# Patient Record
Sex: Female | Born: 1990 | Race: Black or African American | Hispanic: No | Marital: Single | Smoking: Current some day smoker
Health system: Southern US, Community
[De-identification: ages and names within clinical notes are randomized; demographics above are authoritative.]

## PROBLEM LIST (undated history)

## (undated) DIAGNOSIS — Z789 Other specified health status: Secondary | ICD-10-CM

## (undated) DIAGNOSIS — O24419 Gestational diabetes mellitus in pregnancy, unspecified control: Secondary | ICD-10-CM

## (undated) DIAGNOSIS — N83209 Unspecified ovarian cyst, unspecified side: Secondary | ICD-10-CM

## (undated) DIAGNOSIS — E669 Obesity, unspecified: Secondary | ICD-10-CM

## (undated) HISTORY — DX: Obesity, unspecified: E66.9

## (undated) HISTORY — PX: EYE SURGERY: SHX253

## (undated) HISTORY — DX: Gestational diabetes mellitus in pregnancy, unspecified control: O24.419

---

## 2009-12-22 ENCOUNTER — Emergency Department (HOSPITAL_COMMUNITY): Admission: EM | Admit: 2009-12-22 | Discharge: 2009-12-22 | Payer: Self-pay | Admitting: Emergency Medicine

## 2009-12-25 ENCOUNTER — Inpatient Hospital Stay (HOSPITAL_COMMUNITY): Admission: AD | Admit: 2009-12-25 | Discharge: 2009-12-25 | Payer: Self-pay | Admitting: Obstetrics & Gynecology

## 2010-01-03 ENCOUNTER — Ambulatory Visit: Payer: Self-pay | Admitting: Obstetrics & Gynecology

## 2010-01-03 LAB — CONVERTED CEMR LAB: hCG, Beta Chain, Quant, S: 24.3 milliintl units/mL

## 2010-02-01 ENCOUNTER — Encounter: Payer: Self-pay | Admitting: Obstetrics & Gynecology

## 2010-02-01 ENCOUNTER — Ambulatory Visit: Payer: Self-pay | Admitting: Obstetrics & Gynecology

## 2010-07-31 ENCOUNTER — Emergency Department (HOSPITAL_COMMUNITY): Admission: EM | Admit: 2010-07-31 | Discharge: 2010-07-31 | Payer: Self-pay | Admitting: Emergency Medicine

## 2011-01-17 LAB — HCG, QUANTITATIVE, PREGNANCY: hCG, Beta Chain, Quant, S: 532 m[IU]/mL — ABNORMAL HIGH (ref <5)

## 2011-01-18 LAB — WET PREP, GENITAL
Trich, Wet Prep: NONE SEEN
Yeast Wet Prep HPF POC: NONE SEEN

## 2011-01-18 LAB — URINALYSIS, ROUTINE W REFLEX MICROSCOPIC
Bilirubin Urine: NEGATIVE
Glucose, UA: NEGATIVE mg/dL
Specific Gravity, Urine: 1.018 (ref 1.005–1.030)
Urobilinogen, UA: 1 mg/dL (ref 0.0–1.0)
pH: 7.5 (ref 5.0–8.0)

## 2011-01-18 LAB — GC/CHLAMYDIA PROBE AMP, GENITAL
Chlamydia, DNA Probe: NEGATIVE
GC Probe Amp, Genital: NEGATIVE

## 2011-01-18 LAB — POCT PREGNANCY, URINE: Preg Test, Ur: POSITIVE

## 2011-01-18 LAB — URINE MICROSCOPIC-ADD ON

## 2011-01-18 LAB — HCG, QUANTITATIVE, PREGNANCY: hCG, Beta Chain, Quant, S: 2622 m[IU]/mL — ABNORMAL HIGH (ref <5)

## 2011-11-02 ENCOUNTER — Encounter: Payer: Self-pay | Admitting: Emergency Medicine

## 2011-11-02 ENCOUNTER — Emergency Department (INDEPENDENT_AMBULATORY_CARE_PROVIDER_SITE_OTHER)
Admission: EM | Admit: 2011-11-02 | Discharge: 2011-11-02 | Disposition: A | Discharge status: Home / Self Care | Payer: Self-pay | Source: Home / Self Care | Attending: Family Medicine | Admitting: Family Medicine

## 2011-11-02 DIAGNOSIS — J029 Acute pharyngitis, unspecified: Secondary | ICD-10-CM

## 2011-11-02 MED ORDER — GUAIFENESIN-CODEINE 100-10 MG/5ML PO SYRP: 5.0000 mL | ORAL_SOLUTION | Freq: Four times a day (QID) | ORAL | Status: AC | PRN

## 2011-11-02 NOTE — ED Notes (Signed)
PT HERE WITH COUGH AND YELLOW PHLEGM AND SORE THROAT THAT WORSENS WITH EATING OR DRINKING.SX STARTED X 3 DAYS AGO.NO FEVERS/N/V REPORTED

## 2011-11-02 NOTE — ED Provider Notes (Signed)
History     CSN: [id number]  Arrival date & time 11/02/11  1518   First MD Initiated Contact with Patient 11/02/11 1638      Chief Complaint  Patient presents with  . Sore Throat  . Cough    (Consider location/radiation/quality/duration/timing/severity/associated sxs/prior treatment) HPI Comments: Brandi Andrews presents for evaluation of persistent sore throat and cough, worse at night. She reports typical URI symptoms that have mostly resolved, except for nighttime cough.   Patient is a 21 y.o. female presenting with cough. The history is provided by the patient.  Cough This is a new problem. The current episode started more than 2 days ago. The problem occurs constantly. The cough is non-productive. There has been no fever. Associated symptoms include rhinorrhea, sore throat and myalgias. Pertinent negatives include no chills, no ear pain, no shortness of breath and no wheezing. She has tried decongestants and cough syrup for the symptoms. The treatment provided no relief.    History reviewed. No pertinent past medical history.  History reviewed. No pertinent past surgical history.  No family history on file.  History  Substance Use Topics  . Smoking status: Never Smoker   . Smokeless tobacco: Not on file  . Alcohol Use: Yes    OB History    Grav Para Term Preterm Abortions TAB SAB Ect Mult Living                  Review of Systems  Constitutional: Negative for fever and chills.  HENT: Positive for congestion, sore throat, rhinorrhea and sneezing. Negative for ear pain and trouble swallowing.   Eyes: Negative.   Respiratory: Positive for cough. Negative for shortness of breath and wheezing.   Cardiovascular: Negative.   Gastrointestinal: Negative.   Genitourinary: Negative.   Musculoskeletal: Positive for myalgias.  Skin: Negative.     Allergies  Review of patient's allergies indicates no known allergies.  Home Medications   Current Outpatient Rx  Name Route  Sig Dispense Refill  . GUAIFENESIN-CODEINE 100-10 MG/5ML PO SYRP Oral Take 5 mLs by mouth every 6 (six) hours as needed for cough or congestion. 120 mL 0    BP 117/76  Pulse 83  Temp(Src) 98.2 F (36.8 C) (Oral)  Resp 18  SpO2 97%  LMP 10/18/2011  Physical Exam  Nursing note and vitals reviewed. Constitutional: She is oriented to person, place, and time. She appears well-developed and well-nourished.  HENT:  Head: Normocephalic and atraumatic.  Right Ear: Tympanic membrane and external ear normal.  Left Ear: Tympanic membrane and external ear normal.  Mouth/Throat: Uvula is midline, oropharynx is clear and moist and mucous membranes are normal. No oropharyngeal exudate, posterior oropharyngeal edema or posterior oropharyngeal erythema.  Eyes: EOM are normal.  Neck: Normal range of motion.  Pulmonary/Chest: Effort normal and breath sounds normal. She has no wheezes. She has no rhonchi.  Musculoskeletal: Normal range of motion.  Neurological: She is alert and oriented to person, place, and time.  Skin: Skin is warm and dry.  Psychiatric: Her behavior is normal.    ED Course  Procedures (including critical care time)   Labs Reviewed  POCT RAPID STREP A ([facility] ONLY)   No results found.   1. Pharyngitis       MDM  Rapid strep negative; viral pharyngitis; guaifenesin AC        Richardo Priest, MD 11/02/11 1814

## 2012-06-22 ENCOUNTER — Encounter (HOSPITAL_COMMUNITY): Payer: Self-pay | Admitting: Emergency Medicine

## 2012-06-22 ENCOUNTER — Emergency Department (HOSPITAL_COMMUNITY)
Admission: EM | Admit: 2012-06-22 | Discharge: 2012-06-22 | Disposition: A | Discharge status: Home / Self Care | Payer: Self-pay | Attending: Emergency Medicine | Admitting: Emergency Medicine

## 2012-06-22 DIAGNOSIS — K029 Dental caries, unspecified: Secondary | ICD-10-CM | POA: Insufficient documentation

## 2012-06-22 DIAGNOSIS — K0889 Other specified disorders of teeth and supporting structures: Secondary | ICD-10-CM

## 2012-06-22 MED ORDER — PENICILLIN V POTASSIUM 500 MG PO TABS: 500.0000 mg | ORAL_TABLET | Freq: Four times a day (QID) | ORAL | Status: AC

## 2012-06-22 MED ORDER — BUPIVACAINE-EPINEPHRINE PF 0.5-1:200000 % IJ SOLN
1.8000 mL | Freq: Once | INTRAMUSCULAR | Status: AC
  Administered 2012-06-22: 9 mg

## 2012-06-22 MED ORDER — OXYCODONE-ACETAMINOPHEN 5-325 MG PO TABS: 1.0000 | ORAL_TABLET | ORAL | Status: AC | PRN

## 2012-06-22 MED ORDER — BUPIVACAINE-EPINEPHRINE PF 0.5-1:200000 % IJ SOLN
INTRAMUSCULAR | Status: AC
  Filled 2012-06-22: qty 1.8

## 2012-06-22 MED ORDER — OXYCODONE-ACETAMINOPHEN 5-325 MG PO TABS
2.0000 | ORAL_TABLET | Freq: Once | ORAL | Status: AC
  Administered 2012-06-22: 2 via ORAL
  Filled 2012-06-22: qty 2

## 2012-06-22 NOTE — ED Notes (Signed)
Pt told to call [facility] on d/c instructions. Pts sister is taking her home. Pt verbalized understanding of d/c instructions and walked to d/c window.

## 2012-06-22 NOTE — ED Notes (Signed)
Pt report bottom left tooth pain that started yesterday at 10/10 pain. Pt thinks the tooth broke. Pt also reports finger tips have been tingling.

## 2012-06-23 NOTE — ED Provider Notes (Signed)
History     CSN: [id number]  Arrival date & time 06/22/12  1214   First MD Initiated Contact with Patient 06/22/12 1313      No chief complaint on file.   (Consider location/radiation/quality/duration/timing/severity/associated sxs/prior treatment) HPI Comments: Brandi Andrews 21 y.o. female   The chief complaint is: dental pain.    Patient c/o pain in left lower teeth.She states that pain began yesterday and has gradually worsened,  Pain is now 10/10.  She was at work today and was unable to complete her shift due to pain. She denies any drainage int mouth. denies difficulty breathing or swallowing.  Denies fevers, chills, myalgias, arthralgias, nausea, vomiting, diarrhea.     The history is provided by the patient. No language interpreter was used.    No past medical history on file.  History reviewed. No pertinent past surgical history.  No family history on file.  History  Substance Use Topics  . Smoking status: Never Smoker   . Smokeless tobacco: Not on file  . Alcohol Use: Yes    OB History    Grav Para Term Preterm Abortions TAB SAB Ect Mult Living                  Review of Systems  Constitutional: Negative for fever and chills.  HENT: Positive for facial swelling and dental problem. Negative for ear pain, congestion, sore throat, mouth sores, trouble swallowing, neck pain, neck stiffness and sinus pressure.   Respiratory: Negative for choking, shortness of breath, wheezing and stridor.   Cardiovascular: Negative for chest pain.  Gastrointestinal: Negative for nausea, vomiting, abdominal pain and diarrhea.  Musculoskeletal: Negative for myalgias and arthralgias.    Allergies  Review of patient's allergies indicates no known allergies.  Home Medications   Current Outpatient Rx  Name Route Sig Dispense Refill  . NAPROXEN SODIUM 220 MG PO TABS Oral Take 220 mg by mouth 2 (two) times daily with a meal.    . OXYCODONE-ACETAMINOPHEN 5-325 MG PO TABS  Oral Take 1-2 tablets by mouth every 4 (four) hours as needed for pain. 6 tablet 0  . PENICILLIN V POTASSIUM 500 MG PO TABS Oral Take 1 tablet (500 mg total) by mouth 4 (four) times daily. 20 tablet 0    BP 123/78  Pulse 77  Temp 98.6 F (37 C)  Resp 18  SpO2 100%  LMP 06/04/2012  Physical Exam  Nursing note and vitals reviewed. Constitutional: She is oriented to person, place, and time. She appears well-developed and well-nourished. No distress.  HENT:  Head: Normocephalic and atraumatic.  Mouth/Throat: Dental caries present. No dental abscesses or uvula swelling.         Multiple dental caries.Pain in tooth 17,18,and 19. Gingival edema and erythema present. No sores , discharge, or  fluctuance noted on exam.  Eyes: Conjunctivae are normal. No scleral icterus.  Neck: Normal range of motion.  Cardiovascular: Normal rate, regular rhythm and normal heart sounds.  Exam reveals no gallop and no friction rub.   No murmur heard. Pulmonary/Chest: Effort normal and breath sounds normal. No respiratory distress.  Abdominal: Soft. Bowel sounds are normal. She exhibits no distension and no mass. There is no tenderness. There is no guarding.  Neurological: She is alert and oriented to person, place, and time.  Skin: Skin is warm and dry. She is not diaphoretic.    ED Course  Dental Performed by: Arthor Captain Authorized by: Arthor Captain Risks and benefits: risks, benefits and alternatives were  discussed Local anesthesia used: yes Anesthesia: nerve block Local anesthetic: bupivacaine 0.5% with epinephrine and co-phenylcaine spray Anesthetic total: 0.9 ml Patient tolerance: Patient tolerated the procedure well with no immediate complications.   (including critical care time)  Labs Reviewed - No data to display No results found.   1. Pain, dental   2. Dental caries       MDM  Patient with immediated relief form dental block with local infiltration at the base of tooth  17,18,and 19.  Plan  1) d/c PATIENT  With dental follow up 2) narcotic pain relief  Percocet 5/325 # 3) Pen vk 1 qid x10 days All questions answered fully. Discussed reasons to seek immediate care. Patient expresses understanding and agrees with plan.       Arthor Captain, PA-C 06/25/12 2306

## 2013-06-18 ENCOUNTER — Emergency Department (INDEPENDENT_AMBULATORY_CARE_PROVIDER_SITE_OTHER)
Admission: EM | Admit: 2013-06-18 | Discharge: 2013-06-18 | Disposition: A | Discharge status: Home / Self Care | Payer: PRIVATE HEALTH INSURANCE | Source: Home / Self Care | Attending: Emergency Medicine | Admitting: Emergency Medicine

## 2013-06-18 ENCOUNTER — Encounter (HOSPITAL_COMMUNITY): Payer: Self-pay | Admitting: Emergency Medicine

## 2013-06-18 DIAGNOSIS — W57XXXA Bitten or stung by nonvenomous insect and other nonvenomous arthropods, initial encounter: Secondary | ICD-10-CM

## 2013-06-18 DIAGNOSIS — IMO0001 Reserved for inherently not codable concepts without codable children: Secondary | ICD-10-CM

## 2013-06-18 MED ORDER — RANITIDINE HCL 150 MG PO CAPS: 150.0000 mg | ORAL_CAPSULE | Freq: Every day | ORAL | Status: DC

## 2013-06-18 MED ORDER — CETIRIZINE HCL 10 MG PO TABS: 10.0000 mg | ORAL_TABLET | Freq: Every day | ORAL | Status: DC

## 2013-06-18 MED ORDER — DOXYCYCLINE HYCLATE 100 MG PO CAPS: 100.0000 mg | ORAL_CAPSULE | Freq: Two times a day (BID) | ORAL | Status: DC

## 2013-06-18 MED ORDER — METHYLPREDNISOLONE 4 MG PO KIT: PACK | ORAL | Status: DC

## 2013-06-18 NOTE — ED Notes (Addendum)
Pt c/o insect bite to right forearm onset 2 days sxs include: swelling, tenderness, redness Denies: fevers... Taking benadryl w/no relief.  Alert w/no signs of acute distress.

## 2013-06-18 NOTE — ED Provider Notes (Signed)
Medical screening examination/treatment/procedure(s) were performed by non-physician practitioner and as supervising physician I was immediately available for consultation/collaboration.  Leslee Home, M.D.  Reuben Likes, MD 06/18/13 2216

## 2013-06-18 NOTE — ED Notes (Signed)
Bed: UC10 Expected date:  Expected time:  Means of arrival:  Comments: 

## 2013-06-18 NOTE — Discharge Instructions (Signed)
Skin Infections  A skin infection usually develops as a result of disruption of the skin barrier.   CAUSES   A skin infection might occur following:   Trauma or an injury to the skin such as a cut or insect sting.   Inflammation (as in eczema).   Breaks in the skin between the toes (as in athlete's foot).   Swelling (edema).  SYMPTOMS   The legs are the most common site affected. Usually there is:   Redness.   Swelling.   Pain.   There may be red streaks in the area of the infection.  TREATMENT    Minor skin infections may be treated with topical antibiotics, but if the skin infection is severe, hospital care and intravenous (IV) antibiotic treatment may be needed.   Most often skin infections can be treated with oral antibiotic medicine as well as proper rest and elevation of the affected area until the infection improves.   If you are prescribed oral antibiotics, it is important to take them as directed and to take all the pills even if you feel better before you have finished all of the medicine.   You may apply warm compresses to the area for 20-30 minutes 4 times daily.  You might need a tetanus shot now if:   You have no idea when you had the last one.   You have never had a tetanus shot before.   Your wound had dirt in it.  If you need a tetanus shot and you decide not to get one, there is a rare chance of getting tetanus. Sickness from tetanus can be serious. If you get a tetanus shot, your arm may swell and become red and warm at the shot site. This is common and not a problem.  SEEK MEDICAL CARE IF:   The pain and swelling from your infection do not improve within 2 days.   SEEK IMMEDIATE MEDICAL CARE IF:   You develop a fever, chills, or other serious problems.   Document Released: 11/21/2004 Document Revised: 01/06/2012 Document Reviewed: 10/03/2008  [employer] Patient Information 2014 [employer]

## 2013-06-18 NOTE — ED Provider Notes (Signed)
CSN: [id number]     Arrival date & time 06/18/13  1815 History     First MD Initiated Contact with Patient 06/18/13 1915     Chief Complaint  Patient presents with  . Insect Bite   (Consider location/radiation/quality/duration/timing/severity/associated sxs/prior Treatment) HPI Comments: 22 year old female presents for evaluation of an insect bite to her right forearm sustained 2 days ago. She initially had itching. Since then, she increased redness and swelling, and the area has gotten great painful. Night, she denies any other symptoms. She denies fever, chills, NVD, rash elsewhere. She is taking Benadryl for this which has not been helpful.   History reviewed. No pertinent past medical history. History reviewed. No pertinent past surgical history. No family history on file. History  Substance Use Topics  . Smoking status: Never Smoker   . Smokeless tobacco: Not on file  . Alcohol Use: Yes   OB History   Grav Para Term Preterm Abortions TAB SAB Ect Mult Living                 Review of Systems  Constitutional: Negative for fever and chills.  Eyes: Negative for visual disturbance.  Respiratory: Negative for cough and shortness of breath.   Cardiovascular: Negative for chest pain, palpitations and leg swelling.  Gastrointestinal: Negative for nausea, vomiting and abdominal pain.  Endocrine: Negative for polydipsia and polyuria.  Genitourinary: Negative for dysuria, urgency and frequency.  Musculoskeletal: Negative for myalgias and arthralgias.  Skin: Positive for rash (see history of present illness).  Neurological: Negative for dizziness, weakness and light-headedness.    Allergies  Review of patient's allergies indicates no known allergies.  Home Medications   Current Outpatient Rx  Name  Route  Sig  Dispense  Refill  . cetirizine (ZYRTEC) 10 MG tablet   Oral   Take 1 tablet (10 mg total) by mouth daily.   30 tablet   0   . doxycycline (VIBRAMYCIN) 100 MG  capsule   Oral   Take 1 capsule (100 mg total) by mouth 2 (two) times daily.   14 capsule   0   . methylPREDNISolone (MEDROL DOSEPAK) 4 MG tablet      Use as directed   21 tablet   0   . naproxen sodium (ANAPROX) 220 MG tablet   Oral   Take 220 mg by mouth 2 (two) times daily with a meal.         . ranitidine (ZANTAC) 150 MG capsule   Oral   Take 1 capsule (150 mg total) by mouth daily.   30 capsule   0    BP 125/69  Pulse 74  Temp(Src) 98.2 F (36.8 C) (Oral)  Resp 16  SpO2 100% Physical Exam  Constitutional: She is oriented to person, place, and time. She appears well-developed and well-nourished. No distress.  HENT:  Head: Normocephalic and atraumatic.  Pulmonary/Chest: Effort normal.  Musculoskeletal:       Arms: Neurological: She is alert and oriented to person, place, and time. Coordination normal.  Skin: Skin is warm and dry. She is not diaphoretic.  Psychiatric: She has a normal mood and affect.    ED Course   Procedures (including critical care time)  Labs Reviewed - No data to display No results found. 1. Insect bite, infected     MDM  Insect bite causing cellulitis. Treat with doxycycline, we'll also treat symptomatically. Followup if not improving   Meds ordered this encounter  Medications  . doxycycline (VIBRAMYCIN) 100  MG capsule    Sig: Take 1 capsule (100 mg total) by mouth 2 (two) times daily.    Dispense:  14 capsule    Refill:  0  . methylPREDNISolone (MEDROL DOSEPAK) 4 MG tablet    Sig: Use as directed    Dispense:  21 tablet    Refill:  0  . cetirizine (ZYRTEC) 10 MG tablet    Sig: Take 1 tablet (10 mg total) by mouth daily.    Dispense:  30 tablet    Refill:  0  . ranitidine (ZANTAC) 150 MG capsule    Sig: Take 1 capsule (150 mg total) by mouth daily.    Dispense:  30 capsule    Refill:  0     Graylon Good, PA-C 06/18/13 1921

## 2013-07-02 ENCOUNTER — Emergency Department (INDEPENDENT_AMBULATORY_CARE_PROVIDER_SITE_OTHER)
Admission: EM | Admit: 2013-07-02 | Discharge: 2013-07-02 | Disposition: A | Discharge status: Home / Self Care | Payer: Self-pay | Source: Home / Self Care

## 2013-07-02 ENCOUNTER — Other Ambulatory Visit (HOSPITAL_COMMUNITY)
Admission: RE | Admit: 2013-07-02 | Discharge: 2013-07-02 | Disposition: A | Discharge status: Home / Self Care | Payer: Self-pay | Source: Ambulatory Visit | Attending: Emergency Medicine | Admitting: Emergency Medicine

## 2013-07-02 DIAGNOSIS — N760 Acute vaginitis: Secondary | ICD-10-CM | POA: Insufficient documentation

## 2013-07-02 DIAGNOSIS — Z113 Encounter for screening for infections with a predominantly sexual mode of transmission: Secondary | ICD-10-CM | POA: Insufficient documentation

## 2013-07-02 LAB — POCT URINALYSIS DIP (DEVICE)
Hgb urine dipstick: NEGATIVE
Nitrite: NEGATIVE
Urobilinogen, UA: 0.2 mg/dL (ref 0.0–1.0)
pH: 7 (ref 5.0–8.0)

## 2013-07-02 MED ORDER — METRONIDAZOLE 0.75 % VA GEL: 1.0000 | VAGINAL | Status: DC

## 2013-07-02 NOTE — ED Provider Notes (Signed)
CSN: [id number]     Arrival date & time 07/02/13  1114 History   None    No chief complaint on file.  (Consider location/radiation/quality/duration/timing/severity/associated sxs/prior Treatment) Patient is a 22 y.o. female presenting with vaginal discharge. The history is provided by the patient.  Vaginal Discharge Quality:  Thin and watery Severity:  Mild Duration:  4 days Progression:  Unchanged Chronicity:  New Associated symptoms: vaginal itching   Associated symptoms: no abdominal pain, no dysuria, no fever and no urinary frequency   Risk factors: unprotected sex   Risk factors: no new sexual partner and no STI exposure     No past medical history on file. No past surgical history on file. No family history on file. History  Substance Use Topics  . Smoking status: Never Smoker   . Smokeless tobacco: Not on file  . Alcohol Use: Yes   OB History   Grav Para Term Preterm Abortions TAB SAB Ect Mult Living                 Review of Systems  Constitutional: Negative.  Negative for fever.  Gastrointestinal: Negative.  Negative for abdominal pain.  Genitourinary: Positive for vaginal discharge. Negative for dysuria, vaginal bleeding and vaginal pain.    Allergies  Review of patient's allergies indicates no known allergies.  Home Medications   Current Outpatient Rx  Name  Route  Sig  Dispense  Refill  . cetirizine (ZYRTEC) 10 MG tablet   Oral   Take 1 tablet (10 mg total) by mouth daily.   30 tablet   0   . doxycycline (VIBRAMYCIN) 100 MG capsule   Oral   Take 1 capsule (100 mg total) by mouth 2 (two) times daily.   14 capsule   0   . methylPREDNISolone (MEDROL DOSEPAK) 4 MG tablet      Use as directed   21 tablet   0   . metroNIDAZOLE (METROGEL VAGINAL) 0.75 % vaginal gel   Vaginal   Place 1 Applicatorful vaginally 1 day or 1 dose. At bedtime for 5 nights   70 g   0   . naproxen sodium (ANAPROX) 220 MG tablet   Oral   Take 220 mg by mouth 2 (two)  times daily with a meal.         . ranitidine (ZANTAC) 150 MG capsule   Oral   Take 1 capsule (150 mg total) by mouth daily.   30 capsule   0    BP 115/76  Pulse 69  Temp(Src) 99 F (37.2 C) (Oral)  Resp 16  SpO2 100% Physical Exam  Nursing note and vitals reviewed. Constitutional: She is oriented to person, place, and time. She appears well-developed and well-nourished.  Abdominal: Soft. Bowel sounds are normal. She exhibits no distension and no mass. There is no tenderness. There is no rebound and no guarding.  Genitourinary: Uterus normal. Cervix exhibits discharge. Cervix exhibits no motion tenderness and no friability. Right adnexum displays no mass, no tenderness and no fullness. Left adnexum displays no mass, no tenderness and no fullness. No erythema or tenderness around the vagina. No foreign body around the vagina. Vaginal discharge found.    Clear vag d/c.  Neurological: She is alert and oriented to person, place, and time.  Skin: Skin is warm and dry.    ED Course  Procedures (including critical care time) Labs Review Labs Reviewed  POCT URINALYSIS DIP (DEVICE) - Abnormal; Notable for the following:  Leukocytes, UA SMALL (*)    All other components within normal limits  POCT PREGNANCY, URINE   Imaging Review No results found.  MDM   1. Vaginitis        Linna Hoff, MD 07/02/13 1233

## 2013-07-05 NOTE — ED Notes (Signed)
GC/Chlamydia neg., Affirm: Candida and Trich neg., Gardnerella pos. Pt. adequately treated with Metrogel. Vassie Moselle 07/05/2013

## 2013-07-30 ENCOUNTER — Encounter (HOSPITAL_COMMUNITY): Payer: Self-pay | Admitting: Emergency Medicine

## 2013-07-30 ENCOUNTER — Emergency Department (HOSPITAL_COMMUNITY)
Admission: EM | Admit: 2013-07-30 | Discharge: 2013-07-30 | Disposition: A | Discharge status: Home / Self Care | Payer: Self-pay | Attending: Emergency Medicine | Admitting: Emergency Medicine

## 2013-07-30 DIAGNOSIS — Y939 Activity, unspecified: Secondary | ICD-10-CM | POA: Insufficient documentation

## 2013-07-30 DIAGNOSIS — Z23 Encounter for immunization: Secondary | ICD-10-CM | POA: Insufficient documentation

## 2013-07-30 DIAGNOSIS — IMO0002 Reserved for concepts with insufficient information to code with codable children: Secondary | ICD-10-CM | POA: Insufficient documentation

## 2013-07-30 DIAGNOSIS — Y92410 Unspecified street and highway as the place of occurrence of the external cause: Secondary | ICD-10-CM | POA: Insufficient documentation

## 2013-07-30 DIAGNOSIS — Z79899 Other long term (current) drug therapy: Secondary | ICD-10-CM | POA: Insufficient documentation

## 2013-07-30 DIAGNOSIS — T07XXXA Unspecified multiple injuries, initial encounter: Secondary | ICD-10-CM

## 2013-07-30 MED ORDER — TETANUS-DIPHTH-ACELL PERTUSSIS 5-2.5-18.5 LF-MCG/0.5 IM SUSP
0.5000 mL | Freq: Once | INTRAMUSCULAR | Status: AC
  Administered 2013-07-30: 0.5 mL via INTRAMUSCULAR
  Filled 2013-07-30: qty 0.5
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
## 2013-07-31 NOTE — ED Provider Notes (Signed)
Medical screening examination/treatment/procedure(s) were performed by non-physician practitioner and as supervising physician I was immediately available for consultation/collaboration.  Esli Jernigan, MD 07/31/13 0216 

## 2013-12-29 ENCOUNTER — Ambulatory Visit: Payer: Self-pay

## 2014-04-21 ENCOUNTER — Encounter (HOSPITAL_COMMUNITY): Payer: Self-pay | Admitting: Emergency Medicine

## 2014-04-21 ENCOUNTER — Emergency Department (INDEPENDENT_AMBULATORY_CARE_PROVIDER_SITE_OTHER)
Admission: EM | Admit: 2014-04-21 | Discharge: 2014-04-21 | Disposition: A | Discharge status: Home / Self Care | Payer: Self-pay | Source: Home / Self Care

## 2014-04-21 DIAGNOSIS — R112 Nausea with vomiting, unspecified: Secondary | ICD-10-CM

## 2014-04-21 DIAGNOSIS — B349 Viral infection, unspecified: Secondary | ICD-10-CM

## 2014-04-21 DIAGNOSIS — J029 Acute pharyngitis, unspecified: Secondary | ICD-10-CM

## 2014-04-21 DIAGNOSIS — B9789 Other viral agents as the cause of diseases classified elsewhere: Secondary | ICD-10-CM

## 2014-04-21 LAB — POCT RAPID STREP A
STREPTOCOCCUS, GROUP A SCREEN (DIRECT): NEGATIVE
Streptococcus, Group A Screen (Direct): NEGATIVE

## 2014-04-21 MED ORDER — ACETAMINOPHEN-CODEINE #3 300-30 MG PO TABS: 1.0000 | ORAL_TABLET | Freq: Four times a day (QID) | ORAL | Status: DC | PRN

## 2014-04-21 MED ORDER — ONDANSETRON HCL 4 MG PO TABS: 4.0000 mg | ORAL_TABLET | Freq: Four times a day (QID) | ORAL | Status: DC

## 2014-04-21 NOTE — Discharge Instructions (Signed)
Viral Infections A viral infection can be caused by different types of viruses.Most viral infections are not serious and resolve on their own. However, some infections may cause severe symptoms and may lead to further complications. SYMPTOMS Viruses can frequently cause:  Minor sore throat.  Aches and pains.  Headaches.  Runny nose.  Different types of rashes.  Watery eyes.  Tiredness.  Cough.  Loss of appetite.  Gastrointestinal infections, resulting in nausea, vomiting, and diarrhea. These symptoms do not respond to antibiotics because the infection is not caused by bacteria. However, you might catch a bacterial infection following the viral infection. This is sometimes called a "superinfection." Symptoms of such a bacterial infection may include:  Worsening sore throat with pus and difficulty swallowing.  Swollen neck glands.  Chills and a high or persistent fever.  Severe headache.  Tenderness over the sinuses.  Persistent overall ill feeling (malaise), muscle aches, and tiredness (fatigue).  Persistent cough.  Yellow, green, or brown mucus production with coughing. HOME CARE INSTRUCTIONS   Only take over-the-counter or prescription medicines for pain, discomfort, diarrhea, or fever as directed by your caregiver.  Drink enough water and fluids to keep your urine clear or pale yellow. Sports drinks can provide valuable electrolytes, sugars, and hydration.  Get plenty of rest and maintain proper nutrition. Soups and broths with crackers or rice are fine. SEEK IMMEDIATE MEDICAL CARE IF:   You have severe headaches, shortness of breath, chest pain, neck pain, or an unusual rash.  You have uncontrolled vomiting, diarrhea, or you are unable to keep down fluids.  You or your child has an oral temperature above 102 F (38.9 C), not controlled by medicine.  Your baby is older than 3 months with a rectal temperature of 102 F (38.9 C) or higher.  Your baby is 84  months old or younger with a rectal temperature of 100.4 F (38 C) or higher. MAKE SURE YOU:   Understand these instructions.  Will watch your condition.  Will get help right away if you are not doing well or get worse. Document Released: 07/24/2005 Document Revised: 01/06/2012 Document Reviewed: 02/18/2011 [facility] Patient Information 2015 [place], [place]. This information is not intended to replace advice given to you by your health care provider. Make sure you discuss any questions you have with your health care provider.  Sore Throat A sore throat is pain, burning, irritation, or scratchiness of the throat. There is often pain or tenderness when swallowing or talking. A sore throat may be accompanied by other symptoms, such as coughing, sneezing, fever, and swollen neck glands. A sore throat is often the first sign of another sickness, such as a cold, flu, strep throat, or mononucleosis (commonly known as mono). Most sore throats go away without medical treatment. CAUSES  The most common causes of a sore throat include:  A viral infection, such as a cold, flu, or mono.  A bacterial infection, such as strep throat, tonsillitis, or whooping cough.  Seasonal allergies.  Dryness in the air.  Irritants, such as smoke or pollution.  Gastroesophageal reflux disease (GERD). HOME CARE INSTRUCTIONS   Only take over-the-counter medicines as directed by your caregiver.  Drink enough fluids to keep your urine clear or pale yellow.  Rest as needed.  Try using throat sprays, lozenges, or sucking on hard candy to ease any pain (if older than 4 years or as directed).  Sip warm liquids, such as broth, herbal tea, or warm water with honey to relieve pain temporarily.  You may also eat or drink cold or frozen liquids such as frozen ice pops.  Gargle with salt water (mix 1 tsp salt with 8 oz of water).  Do not smoke and avoid secondhand smoke.  Put a cool-mist humidifier in your bedroom at  night to moisten the air. You can also turn on a hot shower and sit in the bathroom with the door closed for 5-10 minutes. SEEK IMMEDIATE MEDICAL CARE IF:  You have difficulty breathing.  You are unable to swallow fluids, soft foods, or your saliva.  You have increased swelling in the throat.  Your sore throat does not get better in 7 days.  You have nausea and vomiting.  You have a fever or persistent symptoms for more than 2-3 days. Nausea and Vomiting Nausea is a sick feeling that often comes before throwing up (vomiting). Vomiting is a reflex where stomach contents come out of your mouth. Vomiting can cause severe loss of body fluids (dehydration). Children and elderly adults can become dehydrated quickly, especially if they also have diarrhea. Nausea and vomiting are symptoms of a condition or disease. It is important to find the cause of your symptoms. CAUSES  Direct irritation of the stomach lining. This irritation can result from increased acid production (gastroesophageal reflux disease), infection, food poisoning, taking certain medicines (such as nonsteroidal anti-inflammatory drugs), alcohol use, or tobacco use. Signals from the brain.These signals could be caused by a headache, heat exposure, an inner ear disturbance, increased pressure in the brain from injury, infection, a tumor, or a concussion, pain, emotional stimulus, or metabolic problems. An obstruction in the gastrointestinal tract (bowel obstruction). Illnesses such as diabetes, hepatitis, gallbladder problems, appendicitis, kidney problems, cancer, sepsis, atypical symptoms of a heart attack, or eating disorders. Medical treatments such as chemotherapy and radiation. Receiving medicine that makes you sleep (general anesthetic) during surgery. DIAGNOSIS Your caregiver may ask for tests to be done if the problems do not improve after a few days. Tests may also be done if symptoms are severe or if the reason for the  nausea and vomiting is not clear. Tests may include: Urine tests. Blood tests. Stool tests. Cultures (to look for evidence of infection). X-rays or other imaging studies. Test results can help your caregiver make decisions about treatment or the need for additional tests. TREATMENT You need to stay well hydrated. Drink frequently but in small amounts.You may wish to drink water, sports drinks, clear broth, or eat frozen ice pops or gelatin dessert to help stay hydrated.When you eat, eating slowly may help prevent nausea.There are also some antinausea medicines that may help prevent nausea. HOME CARE INSTRUCTIONS  Take all medicine as directed by your caregiver. If you do not have an appetite, do not force yourself to eat. However, you must continue to drink fluids. If you have an appetite, eat a normal diet unless your caregiver tells you differently. Eat a variety of complex carbohydrates (rice, wheat, potatoes, bread), lean meats, yogurt, fruits, and vegetables. Avoid high-fat foods because they are more difficult to digest. Drink enough water and fluids to keep your urine clear or pale yellow. If you are dehydrated, ask your caregiver for specific rehydration instructions. Signs of dehydration may include: Severe thirst. Dry lips and mouth. Dizziness. Dark urine. Decreasing urine frequency and amount. Confusion. Rapid breathing or pulse. SEEK IMMEDIATE MEDICAL CARE IF:  You have blood or brown flecks (like coffee grounds) in your vomit. You have black or bloody stools. You have a severe headache  or stiff neck. You are confused. You have severe abdominal pain. You have chest pain or trouble breathing. You do not urinate at least once every 8 hours. You develop cold or clammy skin. You continue to vomit for longer than 24 to 48 hours. You have a fever. MAKE SURE YOU:  Understand these instructions. Will watch your condition. Will get help right away if you are not doing well  or get worse. Document Released: 10/14/2005 Document Revised: 01/06/2012 Document Reviewed: 03/13/2011 [facility] Patient Information 2015 [place], [place]. This information is not intended to replace advice given to you by your health care provider. Make sure you discuss any questions you have with your health care provider.  General Headache Without Cause A headache is pain or discomfort felt around the head or neck area. The specific cause of a headache may not be found. There are many causes and types of headaches. A few common ones are: Tension headaches. Migraine headaches. Cluster headaches. Chronic daily headaches. HOME CARE INSTRUCTIONS  Keep all follow-up appointments with your caregiver or any specialist referral. Only take over-the-counter or prescription medicines for pain or discomfort as directed by your caregiver. Lie down in a dark, quiet room when you have a headache. Keep a headache journal to find out what may trigger your migraine headaches. For example, write down: What you eat and drink. How much sleep you get. Any change to your diet or medicines. Try massage or other relaxation techniques. Put ice packs or heat on the head and neck. Use these 3 to 4 times per day for 15 to 20 minutes each time, or as needed. Limit stress. Sit up straight, and do not tense your muscles. Quit smoking if you smoke. Limit alcohol use. Decrease the amount of caffeine you drink, or stop drinking caffeine. Eat and sleep on a regular schedule. Get 7 to 9 hours of sleep, or as recommended by your caregiver. Keep lights dim if bright lights bother you and make your headaches worse. SEEK MEDICAL CARE IF:  You have problems with the medicines you were prescribed. Your medicines are not working. You have a change from the usual headache. You have nausea or vomiting. SEEK IMMEDIATE MEDICAL CARE IF:  Your headache becomes severe. You have a fever. You have a stiff neck. You have loss of  vision. You have muscular weakness or loss of muscle control. You start losing your balance or have trouble walking. You feel faint or pass out. You have severe symptoms that are different from your first symptoms. MAKE SURE YOU:  Understand these instructions. Will watch your condition. Will get help right away if you are not doing well or get worse. Document Released: 10/14/2005 Document Revised: 01/06/2012 Document Reviewed: 10/30/2011 [facility] Patient Information 2015 [place], [place]. This information is not intended to replace advice given to you by your health care provider. Make sure you discuss any questions you have with your health care provider.   You have a fever and your symptoms suddenly get worse. MAKE SURE YOU:   Understand these instructions.  Will watch your condition.  Will get help right away if you are not doing well or get worse. Document Released: 11/21/2004 Document Revised: 09/30/2012 Document Reviewed: 06/21/2012 [facility] Patient Information 2015 [place], [place]. This information is not intended to replace advice given to you by your health care provider. Make sure you discuss any questions you have with your health care provider.

## 2014-04-21 NOTE — ED Notes (Signed)
Patient complains of headache, sore throat, with body aches; states some chills last night and this morning with some cough.  Also, states nausea and diarrhea this morning.

## 2014-04-21 NOTE — ED Provider Notes (Signed)
CSN: [id number]     Arrival date & time 04/21/14  1604 History   First MD Initiated Contact with Patient 04/21/14 1704     Chief Complaint  Patient presents with  . Headache  . Sore Throat  . Generalized Body Aches   (Consider location/radiation/quality/duration/timing/severity/associated sxs/prior Treatment) HPI Comments: 23 year old female with an acute onset of sore throat, myalgias, chills, headache, nausea vomiting and diarrhea that began last p.m. The GI symptoms began early this morning and she experienced several episodes of diarrhea. None since around 8:00 this morning. No vomiting in the past several hours. Denies rash, fever, tick exposure.   History reviewed. No pertinent past medical history. History reviewed. No pertinent past surgical history. No family history on file. History  Substance Use Topics  . Smoking status: Never Smoker   . Smokeless tobacco: Not on file  . Alcohol Use: Yes   OB History   Grav Para Term Preterm Abortions TAB SAB Ect Mult Living                 Review of Systems  Constitutional: Positive for chills, activity change and appetite change. Negative for fever.  HENT: Positive for postnasal drip and sore throat. Negative for congestion and ear pain.   Eyes: Negative for pain and visual disturbance.  Respiratory: Negative for cough, chest tightness and shortness of breath.   Cardiovascular: Negative for chest pain.  Gastrointestinal: Negative for abdominal pain.       As per history of present illness  Genitourinary: Negative.   Musculoskeletal: Positive for back pain and myalgias.  Neurological: Positive for headaches. Negative for seizures and speech difficulty.    Allergies  Review of patient's allergies indicates no known allergies.  Home Medications   Prior to Admission medications   Medication Sig Start Date End Date Taking? Authorizing Provider  acetaminophen-codeine (TYLENOL #3) 300-30 MG per tablet Take 1-2 tablets by mouth  every 6 (six) hours as needed for moderate pain. 04/21/14   Janne Napoleon, NP  ondansetron (ZOFRAN) 4 MG tablet Take 1 tablet (4 mg total) by mouth every 6 (six) hours. As needed for nausea or vomiting 04/21/14   Janne Napoleon, NP   BP 111/81  Pulse 111  Temp(Src) 98.8 F (37.1 C) (Oral)  Resp 16  SpO2 98%  LMP 04/06/2014 Physical Exam  Nursing note and vitals reviewed. Constitutional: She is oriented to person, place, and time. She appears well-developed. No distress.  HENT:  Mouth/Throat: No oropharyngeal exudate.  Bilateral TMs are normal Oropharynx with mild erythema, cobblestoning and clear PMD.  Eyes: Conjunctivae and EOM are normal.  Neck: Normal range of motion. Neck supple.  Cardiovascular: Normal rate, regular rhythm and normal heart sounds.   Pulmonary/Chest: Effort normal and breath sounds normal. No respiratory distress. She has no wheezes. She has no rales.  Abdominal: Soft. She exhibits no distension. There is no tenderness.  Lymphadenopathy:    She has no cervical adenopathy.  Neurological: She is alert and oriented to person, place, and time.  Skin: Skin is warm and dry.  Psychiatric: She has a normal mood and affect.    ED Course  Procedures (including critical care time) Labs Review Labs Reviewed  POCT RAPID STREP A ([facility] CARE ONLY)  POCT RAPID STREP A ([facility] CARE ONLY)    Imaging Review No results found.   MDM   1. Viral syndrome   2. Pharyngitis   3. Decreased nausea and vomiting     Clear liquids T #  3 for pain #20 zofran OTC meds for drainage Rest Return for problems or worse    Janne Napoleon, NP 04/21/14 1746

## 2014-04-22 NOTE — ED Provider Notes (Signed)
Medical screening examination/treatment/procedure(s) were performed by a resident physician or non-physician practitioner and as the supervising physician I was immediately available for consultation/collaboration.  Lynne Leader, MD    Gregor Hams, MD 04/22/14 [phone]

## 2014-04-23 LAB — CULTURE, GROUP A STREP

## 2015-01-31 ENCOUNTER — Emergency Department (HOSPITAL_COMMUNITY)
Admission: EM | Admit: 2015-01-31 | Discharge: 2015-01-31 | Disposition: A | Discharge status: Home / Self Care | Payer: Self-pay | Source: Home / Self Care | Attending: Family Medicine | Admitting: Family Medicine

## 2015-01-31 ENCOUNTER — Encounter (HOSPITAL_COMMUNITY): Payer: Self-pay | Admitting: Emergency Medicine

## 2015-01-31 DIAGNOSIS — K0889 Other specified disorders of teeth and supporting structures: Secondary | ICD-10-CM

## 2015-01-31 DIAGNOSIS — K088 Other specified disorders of teeth and supporting structures: Secondary | ICD-10-CM

## 2015-01-31 MED ORDER — CLINDAMYCIN HCL 300 MG PO CAPS: 300.0000 mg | ORAL_CAPSULE | Freq: Three times a day (TID) | ORAL | Status: DC

## 2015-01-31 MED ORDER — TERCONAZOLE 80 MG VA SUPP: 80.0000 mg | Freq: Every day | VAGINAL | Status: DC

## 2015-01-31 MED ORDER — DICLOFENAC POTASSIUM 50 MG PO TABS: 50.0000 mg | ORAL_TABLET | Freq: Three times a day (TID) | ORAL | Status: DC

## 2015-01-31 NOTE — ED Notes (Signed)
Pt has been suffering from a right lower toothache for 2 days and states she has had a yeast infection for four days.

## 2015-01-31 NOTE — ED Provider Notes (Signed)
CSN: [id number]     Arrival date & time 01/31/15  1236 History   First MD Initiated Contact with Patient 01/31/15 1411     Chief Complaint  Patient presents with  . Dental Pain  . Vaginitis   (Consider location/radiation/quality/duration/timing/severity/associated sxs/prior Treatment) Patient is a 24 y.o. female presenting with tooth pain. The history is provided by the patient.  Dental Pain Location:  Lower Lower teeth location:  30/RL 1st molar and 19/LL 1st molar Quality:  Throbbing Severity:  Moderate Duration:  1 week Progression:  Worsening Chronicity:  New Context: dental caries and poor dentition   Associated symptoms: no facial pain, no facial swelling and no fever   Risk factors: lack of dental care     History reviewed. No pertinent past medical history. History reviewed. No pertinent past surgical history. History reviewed. No pertinent family history. History  Substance Use Topics  . Smoking status: Never Smoker   . Smokeless tobacco: Not on file  . Alcohol Use: Yes   OB History    No data available     Review of Systems  Constitutional: Negative.  Negative for fever.  HENT: Positive for dental problem. Negative for facial swelling.     Allergies  Review of patient's allergies indicates no known allergies.  Home Medications   Prior to Admission medications   Medication Sig Start Date End Date Taking? Authorizing Provider  acetaminophen-codeine (TYLENOL #3) 300-30 MG per tablet Take 1-2 tablets by mouth every 6 (six) hours as needed for moderate pain. 04/21/14   Janne Napoleon, NP  clindamycin (CLEOCIN) 300 MG capsule Take 1 capsule (300 mg total) by mouth 3 (three) times daily. 01/31/15   Billy Fischer, MD  diclofenac (CATAFLAM) 50 MG tablet Take 1 tablet (50 mg total) by mouth 3 (three) times daily. 01/31/15   Billy Fischer, MD  ondansetron (ZOFRAN) 4 MG tablet Take 1 tablet (4 mg total) by mouth every 6 (six) hours. As needed for nausea or vomiting 04/21/14    Janne Napoleon, NP  terconazole (TERAZOL 3) 80 MG vaginal suppository Place 1 suppository (80 mg total) vaginally at bedtime. 01/31/15   Billy Fischer, MD   BP 131/81 mmHg  Pulse 69  Temp(Src) 98.2 F (36.8 C) (Oral)  Resp 16  SpO2 100%  LMP 01/19/2015 (Exact Date) Physical Exam  Constitutional: She appears well-developed and well-nourished. She appears distressed.  HENT:  Mouth/Throat: Abnormal dentition. Dental caries present.    Nursing note and vitals reviewed.   ED Course  Procedures (including critical care time) Labs Review Labs Reviewed - No data to display  Imaging Review No results found.   MDM   1. Pain, dental        Billy Fischer, MD 02/02/15 [phone]

## 2015-01-31 NOTE — Discharge Instructions (Signed)
Take medicine as prescribed, see your dentist as soon as possible °

## 2015-04-23 ENCOUNTER — Encounter (HOSPITAL_COMMUNITY): Payer: Self-pay | Admitting: *Deleted

## 2015-04-23 ENCOUNTER — Emergency Department (HOSPITAL_COMMUNITY)
Admission: EM | Admit: 2015-04-23 | Discharge: 2015-04-23 | Disposition: A | Discharge status: Home / Self Care | Payer: PRIVATE HEALTH INSURANCE | Attending: Emergency Medicine | Admitting: Emergency Medicine

## 2015-04-23 DIAGNOSIS — Z791 Long term (current) use of non-steroidal anti-inflammatories (NSAID): Secondary | ICD-10-CM | POA: Diagnosis not present

## 2015-04-23 DIAGNOSIS — R5383 Other fatigue: Secondary | ICD-10-CM | POA: Diagnosis not present

## 2015-04-23 DIAGNOSIS — B9789 Other viral agents as the cause of diseases classified elsewhere: Secondary | ICD-10-CM

## 2015-04-23 DIAGNOSIS — Z792 Long term (current) use of antibiotics: Secondary | ICD-10-CM | POA: Diagnosis not present

## 2015-04-23 DIAGNOSIS — J069 Acute upper respiratory infection, unspecified: Secondary | ICD-10-CM | POA: Insufficient documentation

## 2015-04-23 DIAGNOSIS — R630 Anorexia: Secondary | ICD-10-CM | POA: Insufficient documentation

## 2015-04-23 DIAGNOSIS — J029 Acute pharyngitis, unspecified: Secondary | ICD-10-CM | POA: Diagnosis present

## 2015-04-23 MED ORDER — BENZONATATE 100 MG PO CAPS: 100.0000 mg | ORAL_CAPSULE | Freq: Three times a day (TID) | ORAL | Status: DC

## 2015-04-23 NOTE — ED Notes (Signed)
NAD at this time. Pt is stable and going home with her mother.

## 2015-04-23 NOTE — ED Notes (Signed)
Pt presents via POV c/o sore throat, body aches and cough x 2 days  Pt a x 4, NAD.  Pt denies fevers/N/V/D.

## 2015-04-23 NOTE — ED Provider Notes (Signed)
CSN: [id number]     Arrival date & time 04/23/15  1002 History   First MD Initiated Contact with Patient 04/23/15 1007     Chief Complaint  Patient presents with  . Sore Throat   Patient is a 24 y.o. female presenting with pharyngitis.  Sore Throat    24 year old female presents with upper respiratory complaints 2 days. Patient notes that 2 days ago she started developing rhinorrhea, nonproductive cough, fatigue. She reports symptoms have continued to persist with slight worsening over the last day. She reports decreased appetite, reports she is able to eat and drink without significant nausea or vomiting. Patient denies fever, headache, chest pain, shortness of breath, abdominal pain, diarrhea, rashes, close sick contacts. Patient notes that she's been using over-the-counter cough medication with little symptom improvement. Patient reports she's otherwise healthy with no significant comorbidities, does not smoke. Patient does report a sore throat, describes this is minor, no difficulty breathing, swallowing, no drooling, muffled voice. She reports using over-the-counter cough medication.   History reviewed. No pertinent past medical history. History reviewed. No pertinent past surgical history. No family history on file. History  Substance Use Topics  . Smoking status: Never Smoker   . Smokeless tobacco: Not on file  . Alcohol Use: Yes   OB History    No data available     Review of Systems  All other systems reviewed and are negative.   Allergies  Review of patient's allergies indicates no known allergies.  Home Medications   Prior to Admission medications   Medication Sig Start Date End Date Taking? Authorizing Provider  acetaminophen-codeine (TYLENOL #3) 300-30 MG per tablet Take 1-2 tablets by mouth every 6 (six) hours as needed for moderate pain. 04/21/14   Janne Napoleon, NP  benzonatate (TESSALON) 100 MG capsule Take 1 capsule (100 mg total) by mouth every 8 (eight) hours.  04/23/15   Okey Regal, PA-C  clindamycin (CLEOCIN) 300 MG capsule Take 1 capsule (300 mg total) by mouth 3 (three) times daily. 01/31/15   Billy Fischer, MD  diclofenac (CATAFLAM) 50 MG tablet Take 1 tablet (50 mg total) by mouth 3 (three) times daily. 01/31/15   Billy Fischer, MD  ondansetron (ZOFRAN) 4 MG tablet Take 1 tablet (4 mg total) by mouth every 6 (six) hours. As needed for nausea or vomiting 04/21/14   Janne Napoleon, NP  terconazole (TERAZOL 3) 80 MG vaginal suppository Place 1 suppository (80 mg total) vaginally at bedtime. 01/31/15   Billy Fischer, MD   BP 108/66 mmHg  Pulse 77  Temp(Src) 98.4 F (36.9 C) (Oral)  Resp 18  SpO2 100%  LMP 04/09/2015   Physical Exam  Constitutional: She is oriented to person, place, and time. She appears well-developed and well-nourished.  HENT:  Head: Normocephalic and atraumatic.  Mouth/Throat: Uvula is midline, oropharynx is clear and moist and mucous membranes are normal. No oropharyngeal exudate, posterior oropharyngeal edema, posterior oropharyngeal erythema or tonsillar abscesses.  Eyes: Pupils are equal, round, and reactive to light.  Neck: Normal range of motion. Neck supple. No JVD present. No tracheal deviation present. No thyromegaly present.  Cardiovascular: Regular rhythm, normal heart sounds and intact distal pulses.  Exam reveals no gallop and no friction rub.   No murmur heard. Pulmonary/Chest: Effort normal and breath sounds normal. No stridor. No respiratory distress. She has no wheezes. She has no rales. She exhibits no tenderness.  Abdominal: Soft. She exhibits no distension and no mass. There is no tenderness.  There is no rebound and no guarding.  Musculoskeletal: Normal range of motion.  Lymphadenopathy:    She has no cervical adenopathy.  Neurological: She is alert and oriented to person, place, and time. Coordination normal.  Skin: Skin is warm and dry.  Psychiatric: She has a normal mood and affect. Her behavior is normal.  Judgment and thought content normal.  Nursing note and vitals reviewed.   ED Course  Procedures (including critical care time) Labs Review Labs Reviewed - No data to display  Imaging Review No results found.   EKG Interpretation None      MDM   Final diagnoses:  Viral URI with cough    Plan: Patient presents with 2 days of upper respiratory complaints including cough, fatigue. She denies any shortness of breath, chest pain, or fever. Patient's vital signs were noncontributory, oxygen 99 %. Patient has no findings on exam that would indicate further diagnostic or laboratory testing at this time. This likely viral respiratory infection without complication. Patient has no significant comorbidities. Patient will be prescribed cough medication, instructions to follow up with primary care provider in 3 days for reevaluation of symptoms. Patient given strict return precautions, she verbalizes understanding and agreement for today's plan.      Okey Regal, PA-C 04/27/15 1349  Blanchie Dessert, MD 04/28/15 [phone]

## 2015-04-23 NOTE — Discharge Instructions (Signed)
Upper Respiratory Infection, Adult An upper respiratory infection (URI) is also sometimes known as the common cold. The upper respiratory tract includes the nose, sinuses, throat, trachea, and bronchi. Bronchi are the airways leading to the lungs. Most people improve within 1 week, but symptoms can last up to 2 weeks. A residual cough may last even longer.  CAUSES Many different viruses can infect the tissues lining the upper respiratory tract. The tissues become irritated and inflamed and often become very moist. Mucus production is also common. A cold is contagious. You can easily spread the virus to others by oral contact. This includes kissing, sharing a glass, coughing, or sneezing. Touching your mouth or nose and then touching a surface, which is then touched by another person, can also spread the virus. SYMPTOMS  Symptoms typically develop 1 to 3 days after you come in contact with a cold virus. Symptoms vary from person to person. They may include:  Runny nose.  Sneezing.  Nasal congestion.  Sinus irritation.  Sore throat.  Loss of voice (laryngitis).  Cough.  Fatigue.  Muscle aches.  Loss of appetite.  Headache.  Low-grade fever. DIAGNOSIS  You might diagnose your own cold based on familiar symptoms, since most people get a cold 2 to 3 times a year. Your caregiver can confirm this based on your exam. Most importantly, your caregiver can check that your symptoms are not due to another disease such as strep throat, sinusitis, pneumonia, asthma, or epiglottitis. Blood tests, throat tests, and X-rays are not necessary to diagnose a common cold, but they may sometimes be helpful in excluding other more serious diseases. Your caregiver will decide if any further tests are required. RISKS AND COMPLICATIONS  You may be at risk for a more severe case of the common cold if you smoke cigarettes, have chronic heart disease (such as heart failure) or lung disease (such as asthma), or if  you have a weakened immune system. The very young and very old are also at risk for more serious infections. Bacterial sinusitis, middle ear infections, and bacterial pneumonia can complicate the common cold. The common cold can worsen asthma and chronic obstructive pulmonary disease (COPD). Sometimes, these complications can require emergency medical care and may be life-threatening. PREVENTION  The best way to protect against getting a cold is to practice good hygiene. Avoid oral or hand contact with people with cold symptoms. Wash your hands often if contact occurs. There is no clear evidence that vitamin C, vitamin E, echinacea, or exercise reduces the chance of developing a cold. However, it is always recommended to get plenty of rest and practice good nutrition. TREATMENT  Treatment is directed at relieving symptoms. There is no cure. Antibiotics are not effective, because the infection is caused by a virus, not by bacteria. Treatment may include:  Increased fluid intake. Sports drinks offer valuable electrolytes, sugars, and fluids.  Breathing heated mist or steam (vaporizer or shower).  Eating chicken soup or other clear broths, and maintaining good nutrition.  Getting plenty of rest.  Using gargles or lozenges for comfort.  Controlling fevers with ibuprofen or acetaminophen as directed by your caregiver.  Increasing usage of your inhaler if you have asthma. Zinc gel and zinc lozenges, taken in the first 24 hours of the common cold, can shorten the duration and lessen the severity of symptoms. Pain medicines may help with fever, muscle aches, and throat pain. A variety of non-prescription medicines are available to treat congestion and runny nose. Your caregiver  can make recommendations and may suggest nasal or lung inhalers for other symptoms.  HOME CARE INSTRUCTIONS   Only take over-the-counter or prescription medicines for pain, discomfort, or fever as directed by your  caregiver.  Use a warm mist humidifier or inhale steam from a shower to increase air moisture. This may keep secretions moist and make it easier to breathe.  Drink enough water and fluids to keep your urine clear or pale yellow.  Rest as needed.  Return to work when your temperature has returned to normal or as your caregiver advises. You may need to stay home longer to avoid infecting others. You can also use a face mask and careful hand washing to prevent spread of the virus. SEEK MEDICAL CARE IF:   After the first few days, you feel you are getting worse rather than better.  You need your caregiver's advice about medicines to control symptoms.  You develop chills, worsening shortness of breath, or brown or red sputum. These may be signs of pneumonia.  You develop yellow or brown nasal discharge or pain in the face, especially when you bend forward. These may be signs of sinusitis.  You develop a fever, swollen neck glands, pain with swallowing, or white areas in the back of your throat. These may be signs of strep throat. SEEK IMMEDIATE MEDICAL CARE IF:   You have a fever.  You develop severe or persistent headache, ear pain, sinus pain, or chest pain.  You develop wheezing, a prolonged cough, cough up blood, or have a change in your usual mucus (if you have chronic lung disease).  You develop sore muscles or a stiff neck. Document Released: 04/09/2001 Document Revised: 01/06/2012 Document Reviewed: 01/19/2014 [facility] Patient Information 2015 [place], [place]. This information is not intended to replace advice given to you by your health care provider. Make sure you discuss any questions you have with your health care provider.  Please read attached information, please monitor for worsening signs or symptoms to return if any present. Please use cough medication as needed for symptom relief, rest, drink plenty fluids.

## 2015-06-27 ENCOUNTER — Emergency Department (INDEPENDENT_AMBULATORY_CARE_PROVIDER_SITE_OTHER)
Admission: EM | Admit: 2015-06-27 | Discharge: 2015-06-27 | Disposition: A | Discharge status: Home / Self Care | Payer: Self-pay | Source: Home / Self Care | Attending: Emergency Medicine | Admitting: Emergency Medicine

## 2015-06-27 ENCOUNTER — Encounter (HOSPITAL_COMMUNITY): Payer: Self-pay | Admitting: *Deleted

## 2015-06-27 DIAGNOSIS — L03113 Cellulitis of right upper limb: Secondary | ICD-10-CM

## 2015-06-27 MED ORDER — SULFAMETHOXAZOLE-TRIMETHOPRIM 800-160 MG PO TABS: 2.0000 | ORAL_TABLET | Freq: Two times a day (BID) | ORAL | Status: DC

## 2015-06-27 MED ORDER — TRAMADOL HCL 50 MG PO TABS: 50.0000 mg | ORAL_TABLET | Freq: Four times a day (QID) | ORAL | Status: DC | PRN

## 2015-06-27 MED ORDER — IBUPROFEN 800 MG PO TABS: 800.0000 mg | ORAL_TABLET | Freq: Three times a day (TID) | ORAL | Status: DC

## 2015-06-27 MED ORDER — BACITRACIN ZINC 500 UNIT/GM EX OINT
1.0000 "application " | TOPICAL_OINTMENT | Freq: Once | CUTANEOUS | Status: AC
  Administered 2015-06-27: 1 via TOPICAL

## 2015-06-27 NOTE — ED Provider Notes (Signed)
HPI  SUBJECTIVE:  Brandi Andrews is a 24 y.o. female who presents with an erythematous area of gradually increasing size, tenderness, increased temperature on the dorsum of her right wrist starting approximately one week ago. Patient states she thinks that she was bitten by an insect. She notes a pimple in the area starting 2-3 days ago that started draining purulent material spontaneously earlier today. She states that she applied ice, has been taking 400 mg ibuprofen, Benadryl at night. Symptoms are worse with palpation, flexion/extension of her wrists, better after it started draining spontaneously earlier today. No nausea, vomiting, numbness, tingling, fevers, contacts with similar skin infections. Past medical history negative hypertension, diabetes, MRSA, artificial joints, artificial valves, HIV, cancer. LMP now, denies any possibility of being pregnant.   History reviewed. No pertinent past medical history.  History reviewed. No pertinent past surgical history.  History reviewed. No pertinent family history.  Social History  Substance Use Topics  . Smoking status: Never Smoker   . Smokeless tobacco: None  . Alcohol Use: Yes    No current facility-administered medications for this encounter.  Current outpatient prescriptions:  .  acetaminophen-codeine (TYLENOL #3) 300-30 MG per tablet, Take 1-2 tablets by mouth every 6 (six) hours as needed for moderate pain., Disp: 20 tablet, Rfl: 0 .  benzonatate (TESSALON) 100 MG capsule, Take 1 capsule (100 mg total) by mouth every 8 (eight) hours., Disp: 21 capsule, Rfl: 0 .  diclofenac (CATAFLAM) 50 MG tablet, Take 1 tablet (50 mg total) by mouth 3 (three) times daily., Disp: 30 tablet, Rfl: 0 .  ibuprofen (ADVIL,MOTRIN) 800 MG tablet, Take 1 tablet (800 mg total) by mouth 3 (three) times daily., Disp: 30 tablet, Rfl: 0 .  ondansetron (ZOFRAN) 4 MG tablet, Take 1 tablet (4 mg total) by mouth every 6 (six) hours. As needed for nausea or  vomiting, Disp: 12 tablet, Rfl: 0 .  sulfamethoxazole-trimethoprim (BACTRIM DS,SEPTRA DS) 800-160 MG per tablet, Take 2 tablets by mouth 2 (two) times daily., Disp: 40 tablet, Rfl: 0 .  terconazole (TERAZOL 3) 80 MG vaginal suppository, Place 1 suppository (80 mg total) vaginally at bedtime., Disp: 3 suppository, Rfl: 0 .  traMADol (ULTRAM) 50 MG tablet, Take 1 tablet (50 mg total) by mouth every 6 (six) hours as needed., Disp: 15 tablet, Rfl: 0  No Known Allergies   ROS  As noted in HPI.   Physical Exam  BP 108/72 mmHg  Pulse 68  Temp(Src) 98.3 F (36.8 C) (Oral)  Resp 16  SpO2 98%  LMP 06/26/2015  Constitutional: Well developed, well nourished, no acute distress Eyes:  EOMI, conjunctiva normal bilaterally HENT: Normocephalic, atraumatic,mucus membranes moist Respiratory: Normal inspiratory effort Cardiovascular: Normal rate GI: nondistended skin: 2.5 x 3 cm tender area of induration on the dorsum of right wrist with central open area, no expressible purulent drainage. No erythema, increased temperature. Hand exam normal, patient able to do FROM wrist. Marked area of induration with a permanent marker for reference Musculoskeletal: no deformities Neurologic: Alert & oriented x 3, no focal neuro deficits Psychiatric: Speech and behavior appropriate   ED Course   Medications  bacitracin ointment 1 application (1 application Topical Given 06/27/15 1540)    Orders Placed This Encounter  Procedures  . Apply dry sterile dressing    Standing Status: Standing     Number of Occurrences: 1     Standing Expiration Date:     No results found for this or any previous visit (from the past 24  hour(s)). No results found.  ED Clinical Impression  Cellulitis of right upper extremity  ED Assessment/Plan No evidence of septic joint. As wound is draining spontaneously, do not see need for I&D at this time. Wound care done, dressing applied, home with Bactrim, ibuprofen 800 mg,   Tramadol, localized wound care, return here in 2 or 3 days if not getting significantly better. Also referring to Ayesha Mohair here in clinic for orange card and PMD referral. Patient has no primary care physician.  Discussed MDM, plan and followup with patient . Discussed sn/sx that should prompt return to the UC or ED. Patient agrees with plan.  *This clinic note was created using Dragon dictation software. Therefore, there may be occasional mistakes despite careful proofreading.  ?   Melynda Ripple, MD 06/27/15 [id number]

## 2015-06-27 NOTE — Discharge Instructions (Signed)
Warm compresses to this up to 4 times a day. Return here in 2 or 3 days if not getting significantly better for the signs and symptoms we discussed. It will continue to drain. I'm giving you information on MRSA for you to read, although I'm not absolutely positive that this is what is causing your symptoms today. It is a very common cause of infections like these. Follow-up with Burman Nieves here to help you find a primary care provider

## 2015-06-27 NOTE — ED Notes (Signed)
Pt  Has   A  Red  Draining  Irritated  Area  To  r   Wrist      That   She   Noticed  sev  Days  Ago         Area  Is  Tender  To  The  Touch

## 2015-11-20 ENCOUNTER — Emergency Department (INDEPENDENT_AMBULATORY_CARE_PROVIDER_SITE_OTHER)
Admission: EM | Admit: 2015-11-20 | Discharge: 2015-11-20 | Disposition: A | Discharge status: Home / Self Care | Payer: 59 | Source: Home / Self Care | Attending: Family Medicine | Admitting: Family Medicine

## 2015-11-20 ENCOUNTER — Encounter (HOSPITAL_COMMUNITY): Payer: Self-pay | Admitting: Emergency Medicine

## 2015-11-20 DIAGNOSIS — J069 Acute upper respiratory infection, unspecified: Secondary | ICD-10-CM | POA: Diagnosis not present

## 2015-11-20 DIAGNOSIS — J9801 Acute bronchospasm: Secondary | ICD-10-CM

## 2015-11-20 DIAGNOSIS — R0982 Postnasal drip: Secondary | ICD-10-CM

## 2015-11-20 MED ORDER — ALBUTEROL SULFATE HFA 108 (90 BASE) MCG/ACT IN AERS: 2.0000 | INHALATION_SPRAY | RESPIRATORY_TRACT | Status: DC | PRN

## 2015-11-20 MED ORDER — PREDNISONE 20 MG PO TABS: ORAL_TABLET | ORAL | Status: DC

## 2015-11-20 NOTE — Discharge Instructions (Signed)
Bronchospasm, Adult Albuterol HFA for cough and wheeze Prednisone for inflammation Recommend taking Zyrtec or Allegra for drainage. Robitussin-DM every 4 hours as needed for cough Sudafed PE 10 mg every 4 hours if needed for upper respiratory congestion. Saline nasal spray often to help clear nose. Tylenol or ibuprofen for discomfort. A bronchospasm is a spasm or tightening of the airways going into the lungs. During a bronchospasm breathing becomes more difficult because the airways get smaller. When this happens there can be coughing, a whistling sound when breathing (wheezing), and difficulty breathing. Bronchospasm is often associated with asthma, but not all patients who experience a bronchospasm have asthma. CAUSES  A bronchospasm is caused by inflammation or irritation of the airways. The inflammation or irritation may be triggered by:   Allergies (such as to animals, pollen, food, or mold). Allergens that cause bronchospasm may cause wheezing immediately after exposure or many hours later.   Infection. Viral infections are believed to be the most common cause of bronchospasm.   Exercise.   Irritants (such as pollution, cigarette smoke, strong odors, aerosol sprays, and paint fumes).   Weather changes. Winds increase molds and pollens in the air. Rain refreshes the air by washing irritants out. Cold air may cause inflammation.   Stress and emotional upset.  SIGNS AND SYMPTOMS   Wheezing.   Excessive nighttime coughing.   Frequent or severe coughing with a simple cold.   Chest tightness.   Shortness of breath.  DIAGNOSIS  Bronchospasm is usually diagnosed through a history and physical exam. Tests, such as chest X-rays, are sometimes done to look for other conditions. TREATMENT   Inhaled medicines can be given to open up your airways and help you breathe. The medicines can be given using either an inhaler or a nebulizer machine.  Corticosteroid medicines may  be given for severe bronchospasm, usually when it is associated with asthma. HOME CARE INSTRUCTIONS   Always have a plan prepared for seeking medical care. Know when to call your health care provider and local emergency services (911 in the [place] Know where you can access local emergency care.  Only take medicines as directed by your health care provider.  If you were prescribed an inhaler or nebulizer machine, ask your health care provider to explain how to use it correctly. Always use a spacer with your inhaler if you were given one.  It is necessary to remain calm during an attack. Try to relax and breathe more slowly.  Control your home environment in the following ways:   Change your heating and air conditioning filter at least once a month.   Limit your use of fireplaces and wood stoves.  Do not smoke and do not allow smoking in your home.   Avoid exposure to perfumes and fragrances.   Get rid of pests (such as roaches and mice) and their droppings.   Throw away plants if you see mold on them.   Keep your house clean and dust free.   Replace carpet with wood, tile, or vinyl flooring. Carpet can trap dander and dust.   Use allergy-proof pillows, mattress covers, and box spring covers.   Wash bed sheets and blankets every week in hot water and dry them in a dryer.   Use blankets that are made of polyester or cotton.   Wash hands frequently. SEEK MEDICAL CARE IF:   You have muscle aches.   You have chest pain.   The sputum changes from clear or white to yellow, green,  gray, or bloody.   The sputum you cough up gets thicker.   There are problems that may be related to the medicine you are given, such as a rash, itching, swelling, or trouble breathing.  SEEK IMMEDIATE MEDICAL CARE IF:   You have worsening wheezing and coughing even after taking your prescribed medicines.   You have increased difficulty breathing.   You develop severe chest  pain. MAKE SURE YOU:   Understand these instructions.  Will watch your condition.  Will get help right away if you are not doing well or get worse.   This information is not intended to replace advice given to you by your health care provider. Make sure you discuss any questions you have with your health care provider.   Document Released: 10/17/2003 Document Revised: 11/04/2014 Document Reviewed: 04/05/2013 Elsevier Interactive Patient Education 2016 [employer].  How to Use an Inhaler Using your inhaler correctly is very important. Good technique will make sure that the medicine reaches your lungs.  HOW TO USE AN INHALER:  Take the cap off the inhaler.  If this is the first time using your inhaler, you need to prime it. Shake the inhaler for 5 seconds. Release four puffs into the air, away from your face. Ask your doctor for help if you have questions.  Shake the inhaler for 5 seconds.  Turn the inhaler so the bottle is above the mouthpiece.  Put your pointer finger on top of the bottle. Your thumb holds the bottom of the inhaler.  Open your mouth.  Either hold the inhaler away from your mouth (the width of 2 fingers) or place your lips tightly around the mouthpiece. Ask your doctor which way to use your inhaler.  Breathe out as much air as possible.  Breathe in and push down on the bottle 1 time to release the medicine. You will feel the medicine go in your mouth and throat.  Continue to take a deep breath in very slowly. Try to fill your lungs.  After you have breathed in completely, hold your breath for 10 seconds. This will help the medicine to settle in your lungs. If you cannot hold your breath for 10 seconds, hold it for as long as you can before you breathe out.  Breathe out slowly, through pursed lips. Whistling is an example of pursed lips.  If your doctor has told you to take more than 1 puff, wait at least 15-30 seconds between puffs. This will help you get  the best results from your medicine. Do not use the inhaler more than your doctor tells you to.  Put the cap back on the inhaler.  Follow the directions from your doctor or from the inhaler package about cleaning the inhaler. If you use more than one inhaler, ask your doctor which inhalers to use and what order to use them in. Ask your doctor to help you figure out when you will need to refill your inhaler.  If you use a steroid inhaler, always rinse your mouth with water after your last puff, gargle and spit out the water. Do not swallow the water. GET HELP IF:  The inhaler medicine only partially helps to stop wheezing or shortness of breath.  You are having trouble using your inhaler.  You have some increase in thick spit (phlegm). GET HELP RIGHT AWAY IF:  The inhaler medicine does not help your wheezing or shortness of breath or you have tightness in your chest.  You have dizziness, headaches,  or fast heart rate.  You have chills, fever, or night sweats.  You have a large increase of thick spit, or your thick spit is bloody. MAKE SURE YOU:   Understand these instructions.  Will watch your condition.  Will get help right away if you are not doing well or get worse.   This information is not intended to replace advice given to you by your health care provider. Make sure you discuss any questions you have with your health care provider.   Document Released: 07/23/2008 Document Revised: 08/04/2013 Document Reviewed: 05/13/2013 Elsevier Interactive Patient Education 2016 [employer]  Upper Respiratory Infection, Adult Most upper respiratory infections (URIs) are caused by a virus. A URI affects the nose, throat, and upper air passages. The most common type of URI is often called "the common cold." HOME CARE   Take medicines only as told by your doctor.  Gargle warm saltwater or take cough drops to comfort your throat as told by your doctor.  Use a warm mist humidifier or  inhale steam from a shower to increase air moisture. This may make it easier to breathe.  Drink enough fluid to keep your pee (urine) clear or pale yellow.  Eat soups and other clear broths.  Have a healthy diet.  Rest as needed.  Go back to work when your fever is gone or your doctor says it is okay.  You may need to stay home longer to avoid giving your URI to others.  You can also wear a face mask and wash your hands often to prevent spread of the virus.  Use your inhaler more if you have asthma.  Do not use any tobacco products, including cigarettes, chewing tobacco, or electronic cigarettes. If you need help quitting, ask your doctor. GET HELP IF:  You are getting worse, not better.  Your symptoms are not helped by medicine.  You have chills.  You are getting more short of breath.  You have brown or red mucus.  You have yellow or brown discharge from your nose.  You have pain in your face, especially when you bend forward.  You have a fever.  You have puffy (swollen) neck glands.  You have pain while swallowing.  You have white areas in the back of your throat. GET HELP RIGHT AWAY IF:   You have very bad or constant:  Headache.  Ear pain.  Pain in your forehead, behind your eyes, and over your cheekbones (sinus pain).  Chest pain.  You have long-lasting (chronic) lung disease and any of the following:  Wheezing.  Long-lasting cough.  Coughing up blood.  A change in your usual mucus.  You have a stiff neck.  You have changes in your:  Vision.  Hearing.  Thinking.  Mood. MAKE SURE YOU:   Understand these instructions.  Will watch your condition.  Will get help right away if you are not doing well or get worse.   This information is not intended to replace advice given to you by your health care provider. Make sure you discuss any questions you have with your health care provider.   Document Released: 04/01/2008 Document Revised:  02/28/2015 Document Reviewed: 01/19/2014 Elsevier Interactive Patient Education [employer].

## 2015-11-20 NOTE — ED Provider Notes (Signed)
CSN: [id number]     Arrival date & time 11/20/15  1356 History   First MD Initiated Contact with Patient 11/20/15 1702     Chief Complaint  Patient presents with  . URI   (Consider location/radiation/quality/duration/timing/severity/associated sxs/prior Treatment) HPI Comments: 25 year old female complaining of a cough for one week. This is associated with a headache for 2 days, sore throat, PND and sniffles. She states she smokes one or 2 cigarettes a day. Denies documented fevers.  Patient is a 25 y.o. female presenting with URI.  URI Presenting symptoms: congestion, cough, rhinorrhea and sore throat   Presenting symptoms: no fatigue and no fever   Associated symptoms: no neck pain     History reviewed. No pertinent past medical history. History reviewed. No pertinent past surgical history. No family history on file. Social History  Substance Use Topics  . Smoking status: Never Smoker   . Smokeless tobacco: None  . Alcohol Use: Yes   OB History    No data available     Review of Systems  Constitutional: Negative for fever, chills, activity change, appetite change and fatigue.  HENT: Positive for congestion, postnasal drip, rhinorrhea and sore throat. Negative for ear discharge and facial swelling.   Eyes: Negative.   Respiratory: Positive for cough. Negative for shortness of breath.   Cardiovascular: Negative.   Musculoskeletal: Negative for neck pain and neck stiffness.  Skin: Negative for pallor and rash.  Neurological: Negative.     Allergies  Review of patient's allergies indicates no known allergies.  Home Medications   Prior to Admission medications   Medication Sig Start Date End Date Taking? Authorizing Provider  acetaminophen-codeine (TYLENOL #3) 300-30 MG per tablet Take 1-2 tablets by mouth every 6 (six) hours as needed for moderate pain. 04/21/14   Janne Napoleon, NP  albuterol (PROVENTIL HFA;VENTOLIN HFA) 108 (90 Base) MCG/ACT inhaler Inhale 2 puffs into the  lungs every 4 (four) hours as needed for wheezing or shortness of breath. 11/20/15   Janne Napoleon, NP  benzonatate (TESSALON) 100 MG capsule Take 1 capsule (100 mg total) by mouth every 8 (eight) hours. 04/23/15   Okey Regal, PA-C  diclofenac (CATAFLAM) 50 MG tablet Take 1 tablet (50 mg total) by mouth 3 (three) times daily. 01/31/15   Billy Fischer, MD  ibuprofen (ADVIL,MOTRIN) 800 MG tablet Take 1 tablet (800 mg total) by mouth 3 (three) times daily. 06/27/15   Melynda Ripple, MD  ondansetron (ZOFRAN) 4 MG tablet Take 1 tablet (4 mg total) by mouth every 6 (six) hours. As needed for nausea or vomiting 04/21/14   Janne Napoleon, NP  predniSONE (DELTASONE) 20 MG tablet Take 3 tabs po on first day, 2 tabs second day, 2 tabs third day, 1 tab fourth day, 1 tab 5th day. Take with food. 11/20/15   Janne Napoleon, NP  terconazole (TERAZOL 3) 80 MG vaginal suppository Place 1 suppository (80 mg total) vaginally at bedtime. 01/31/15   Billy Fischer, MD  traMADol (ULTRAM) 50 MG tablet Take 1 tablet (50 mg total) by mouth every 6 (six) hours as needed. 06/27/15   Melynda Ripple, MD   Meds Ordered and Administered this Visit  Medications - No data to display  BP 122/88 mmHg  Pulse 82  Temp(Src) 98.3 F (36.8 C) (Oral)  Resp 82  SpO2 100%  LMP 11/07/2015 No data found.   Physical Exam  Constitutional: She is oriented to person, place, and time. She appears well-developed and well-nourished. No distress.  HENT:  Mouth/Throat: No oropharyngeal exudate.  Bilateral TMs mildly retracted. Oropharynx with mild clear PND and cobblestoning. No exudates.   Eyes: Conjunctivae and EOM are normal.  Neck: Normal range of motion. Neck supple.  Cardiovascular: Normal rate, regular rhythm, normal heart sounds and intact distal pulses.   Pulmonary/Chest: Effort normal. No respiratory distress.  Patient having a difficult time taking a deep breath without having call spasms. She was able to take some shallow breaths with no  wheezing. No crackles.  Musculoskeletal: Normal range of motion. She exhibits no edema.  Lymphadenopathy:    She has no cervical adenopathy.  Neurological: She is alert and oriented to person, place, and time. She exhibits normal muscle tone.  Skin: Skin is warm and dry.  Psychiatric: She has a normal mood and affect.  Nursing note and vitals reviewed.   ED Course  Procedures (including critical care time)  Labs Review Labs Reviewed - No data to display  Imaging Review No results found.   Visual Acuity Review  Right Eye Distance:   Left Eye Distance:   Bilateral Distance:    Right Eye Near:   Left Eye Near:    Bilateral Near:         MDM   1. URI (upper respiratory infection)   2. PND (post-nasal drip)   3. Bronchospasm     Bronchospasm, Adult Albuterol HFA for cough and wheeze Prednisone for inflammation Recommend taking Zyrtec or Allegra for drainage. Robitussin-DM every 4 hours as needed for cough Sudafed PE 10 mg every 4 hours if needed for upper respiratory congestion. Saline nasal spray often to help clear nose. Tylenol or ibuprofen for discomfort   Janne Napoleon, NP 11/20/15 1745

## 2015-11-20 NOTE — ED Notes (Signed)
Sore throat, congestion, "little phlegm yellow", headache, painful face, denies fever.

## 2016-01-31 ENCOUNTER — Encounter (HOSPITAL_COMMUNITY): Payer: Self-pay | Admitting: *Deleted

## 2016-01-31 ENCOUNTER — Ambulatory Visit (HOSPITAL_COMMUNITY)
Admission: EM | Admit: 2016-01-31 | Discharge: 2016-01-31 | Disposition: A | Discharge status: Nursing Home (Includes ICF) | Payer: 59 | Attending: Family Medicine | Admitting: Family Medicine

## 2016-01-31 DIAGNOSIS — R51 Headache: Secondary | ICD-10-CM

## 2016-01-31 DIAGNOSIS — R519 Headache, unspecified: Secondary | ICD-10-CM

## 2016-01-31 NOTE — ED Notes (Addendum)
Nausea  And headache x  3  Days  And  Tired      Pt reports was  Lifting  Object  3  Days  Ago  And  Felt a  Pop  In her  Head

## 2016-01-31 NOTE — ED Provider Notes (Signed)
CSN: [id number]     Arrival date & time 01/31/16  1910 History   First MD Initiated Contact with Patient 01/31/16 2016     Chief Complaint  Patient presents with  . Headache   (Consider location/radiation/quality/duration/timing/severity/associated sxs/prior Treatment) Patient is a 25 y.o. female presenting with headaches. The history is provided by the patient and a parent.  Headache Pain location:  Generalized Quality:  Sharp Radiates to:  Does not radiate Onset quality:  Sudden Duration:  3 days Timing:  Constant Progression:  Unchanged Chronicity:  New Similar to prior headaches: no   Context: bright light   Relieved by:  None tried Ineffective treatments:  None tried Associated symptoms: eye pain, nausea and photophobia   Associated symptoms: no abdominal pain, no blurred vision, no fever, no loss of balance, no numbness, no vomiting and no weakness     History reviewed. No pertinent past medical history. History reviewed. No pertinent past surgical history. History reviewed. No pertinent family history. Social History  Substance Use Topics  . Smoking status: Never Smoker   . Smokeless tobacco: None  . Alcohol Use: Yes   OB History    No data available     Review of Systems  Constitutional: Negative.  Negative for fever.  Eyes: Positive for photophobia and pain. Negative for blurred vision.  Respiratory: Negative.   Cardiovascular: Negative.   Gastrointestinal: Positive for nausea. Negative for vomiting and abdominal pain.  Neurological: Positive for headaches. Negative for speech difficulty, weakness, numbness and loss of balance.  All other systems reviewed and are negative.   Allergies  Review of patient's allergies indicates no known allergies.  Home Medications   Prior to Admission medications   Medication Sig Start Date End Date Taking? Authorizing Provider  acetaminophen-codeine (TYLENOL #3) 300-30 MG per tablet Take 1-2 tablets by mouth every 6  (six) hours as needed for moderate pain. 04/21/14   Janne Napoleon, NP  albuterol (PROVENTIL HFA;VENTOLIN HFA) 108 (90 Base) MCG/ACT inhaler Inhale 2 puffs into the lungs every 4 (four) hours as needed for wheezing or shortness of breath. 11/20/15   Janne Napoleon, NP  benzonatate (TESSALON) 100 MG capsule Take 1 capsule (100 mg total) by mouth every 8 (eight) hours. 04/23/15   Okey Regal, PA-C  diclofenac (CATAFLAM) 50 MG tablet Take 1 tablet (50 mg total) by mouth 3 (three) times daily. 01/31/15   Billy Fischer, MD  ibuprofen (ADVIL,MOTRIN) 800 MG tablet Take 1 tablet (800 mg total) by mouth 3 (three) times daily. 06/27/15   Melynda Ripple, MD  ondansetron (ZOFRAN) 4 MG tablet Take 1 tablet (4 mg total) by mouth every 6 (six) hours. As needed for nausea or vomiting 04/21/14   Janne Napoleon, NP  predniSONE (DELTASONE) 20 MG tablet Take 3 tabs po on first day, 2 tabs second day, 2 tabs third day, 1 tab fourth day, 1 tab 5th day. Take with food. 11/20/15   Janne Napoleon, NP  terconazole (TERAZOL 3) 80 MG vaginal suppository Place 1 suppository (80 mg total) vaginally at bedtime. 01/31/15   Billy Fischer, MD  traMADol (ULTRAM) 50 MG tablet Take 1 tablet (50 mg total) by mouth every 6 (six) hours as needed. 06/27/15   Melynda Ripple, MD   Meds Ordered and Administered this Visit  Medications - No data to display  BP 119/85 mmHg  Pulse 75  Temp(Src) 98.8 F (37.1 C) (Temporal)  Resp 16  SpO2 99%  LMP 01/19/2016 No data found.   Physical  Exam  Constitutional: She is oriented to person, place, and time. She appears well-developed and well-nourished. She appears distressed.  HENT:  Head: Normocephalic.  Right Ear: External ear normal.  Left Ear: External ear normal.  Mouth/Throat: Oropharynx is clear and moist.  Eyes: Conjunctivae and EOM are normal. Pupils are equal, round, and reactive to light.  Neck: Normal range of motion. Neck supple.  Cardiovascular: Normal rate, normal heart sounds and intact distal  pulses.   Pulmonary/Chest: Effort normal and breath sounds normal.  Lymphadenopathy:    She has no cervical adenopathy.  Neurological: She is alert and oriented to person, place, and time.  Skin: Skin is warm and dry.  Nursing note and vitals reviewed.   ED Course  Procedures (including critical care time)  Labs Review Labs Reviewed - No data to display  Imaging Review No results found.   Visual Acuity Review  Right Eye Distance:   Left Eye Distance:   Bilateral Distance:    Right Eye Near:   Left Eye Near:    Bilateral Near:         MDM   1. Worst headache of life    Sent for HA eval of something burst in my head bending over.    Billy Fischer, MD 01/31/16 2043

## 2016-04-11 ENCOUNTER — Ambulatory Visit (HOSPITAL_COMMUNITY)
Admission: EM | Admit: 2016-04-11 | Discharge: 2016-04-11 | Disposition: A | Discharge status: Home / Self Care | Payer: 59 | Attending: Emergency Medicine | Admitting: Emergency Medicine

## 2016-04-11 ENCOUNTER — Encounter (HOSPITAL_COMMUNITY): Payer: Self-pay | Admitting: Emergency Medicine

## 2016-04-11 DIAGNOSIS — K05219 Aggressive periodontitis, localized, unspecified severity: Secondary | ICD-10-CM

## 2016-04-11 MED ORDER — LIDOCAINE HCL (PF) 2 % IJ SOLN
INTRAMUSCULAR | Status: AC
Start: 2016-04-11 — End: 2016-04-11
  Filled 2016-04-11: qty 2

## 2016-04-11 MED ORDER — PENICILLIN V POTASSIUM 500 MG PO TABS: 500.0000 mg | ORAL_TABLET | Freq: Four times a day (QID) | ORAL | Status: DC

## 2016-04-11 MED ORDER — HYDROCODONE-ACETAMINOPHEN 5-325 MG PO TABS: ORAL_TABLET | ORAL | Status: DC

## 2016-04-11 MED ORDER — LIDOCAINE-EPINEPHRINE (PF) 2 %-1:200000 IJ SOLN
INTRAMUSCULAR | Status: AC
  Filled 2016-04-11: qty 20

## 2016-04-11 MED ORDER — IBUPROFEN 800 MG PO TABS: 800.0000 mg | ORAL_TABLET | Freq: Three times a day (TID) | ORAL | Status: DC

## 2016-04-11 MED ORDER — CHLORHEXIDINE GLUCONATE 0.12 % MT SOLN: OROMUCOSAL | Status: DC

## 2016-04-11 NOTE — ED Provider Notes (Signed)
HPI  SUBJECTIVE:  Brandi Andrews is a 25 y.o. female who presents with a painful mass of gradually increasing size on  her lower right gum. She reports facial swelling. Symptoms are worse with eating and swallowing, better with Orajel, ibuprofen 800 mg. She has not tried anything else for this. She states the symptoms of been present for 3 days. She denies fevers, drooling, trismus, swelling underneath her jaw, neck stiffness, swelling underneath her tongue. No trauma to the tooth or gum. She states that the tooth is chipped, but has been so for a while. She has a dentist appointment in 5 or 6 days. Past medical history negative for diabetes, hypertension. LMP: now, states that there is no chance of being pregnant. PMD: None.  Last tetanus several months ago.  History reviewed. No pertinent past medical history.  History reviewed. No pertinent past surgical history.  Family History  Problem Relation Age of Onset  . Cancer Mother   . Diabetes Mother     Social History  Substance Use Topics  . Smoking status: Current Every Day Smoker  . Smokeless tobacco: None  . Alcohol Use: Yes    No current facility-administered medications for this encounter.  Current outpatient prescriptions:  .  albuterol (PROVENTIL HFA;VENTOLIN HFA) 108 (90 Base) MCG/ACT inhaler, Inhale 2 puffs into the lungs every 4 (four) hours as needed for wheezing or shortness of breath., Disp: 1 Inhaler, Rfl: 0 .  chlorhexidine (PERIDEX) 0.12 % solution, 15 mL swish and spit bid, Disp: 480 mL, Rfl: 0 .  HYDROcodone-acetaminophen (NORCO/VICODIN) 5-325 MG tablet, 1-2 tabs q 6hr prn pain, Disp: 20 tablet, Rfl: 0 .  ibuprofen (ADVIL,MOTRIN) 800 MG tablet, Take 1 tablet (800 mg total) by mouth 3 (three) times daily., Disp: 30 tablet, Rfl: 0 .  penicillin v potassium (VEETID) 500 MG tablet, Take 1 tablet (500 mg total) by mouth 4 (four) times daily. X 10 days, Disp: 40 tablet, Rfl: 0  No Known Allergies   ROS  As noted in  HPI.   Physical Exam  BP 113/78 mmHg  Pulse 73  Temp(Src) 98.6 F (37 C) (Oral)  Resp 12  SpO2 98%  LMP 04/11/2016  Constitutional: Well developed, well nourished, no acute distress Eyes:  EOMI, conjunctiva normal bilaterally HENT: Normocephalic, atraumatic,mucus membranes moist. Tenderness, swelling along the right lower gum with fluctuance consistent with a gingival abscess. Tooth #19, chipped, but nontender.  other teeth intact. No drooling, trismus, swelling underneath the tongue. Lymph no cervical or submandibular adenopathy  Respiratory: Normal inspiratory effort Cardiovascular: Normal rate GI: nondistended skin: No rash, skin intact Musculoskeletal: no deformities Neurologic: Alert & oriented x 3, no focal neuro deficits Psychiatric: Speech and behavior appropriate   ED Course   Medications - No data to display  No orders of the defined types were placed in this encounter.    No results found for this or any previous visit (from the past 24 hour(s)). No results found.  ED Clinical Impression  Gingival abscess   ED Assessment/Plan  [place] narcotic database reviewed. no narcotic prescription in the past 6 months.  Attempting I&D. Plan to send home with penicillin, Norco, Peridex, ibuprofen. She has follow-up already arranged.  Procedure note: Obtained verbal consent from the patient. Used 0.5 cc of lidocaine 2% with epi to anesthetize the gum. Made a single stab incision with an 11 blade and expressed copious amounts of purulent drainage. Patient states she feels better after the procedure. She tolerated the procedure well.  Plan as above in addition to salt water rinses. No evidence of deep space tissue infection such as Ludwig's angina at this time.  Discussed  MDM, plan and followup with patient. Discussed sn/sx that should prompt return to the   ED. Patient  agrees with plan.   *This clinic note was created using Dragon dictation software. Therefore, there  may be occasional mistakes despite careful proofreading.  ?   Melynda Ripple, MD 04/11/16 1752

## 2016-04-11 NOTE — ED Notes (Signed)
Issues for a week with dental pain.  Reports a knot was present, seemed to go away, but over the last three days its reoccurred and a lot worse.  Pain is bottom, right of mouth.  Patient does have a dentist and made an appt for next week (earliest appt offered).

## 2016-08-18 ENCOUNTER — Ambulatory Visit (HOSPITAL_COMMUNITY)
Admission: EM | Admit: 2016-08-18 | Discharge: 2016-08-18 | Disposition: A | Discharge status: Home / Self Care | Payer: Self-pay | Attending: Family Medicine | Admitting: Family Medicine

## 2016-08-18 ENCOUNTER — Encounter (HOSPITAL_COMMUNITY): Payer: Self-pay | Admitting: Emergency Medicine

## 2016-08-18 DIAGNOSIS — J111 Influenza due to unidentified influenza virus with other respiratory manifestations: Secondary | ICD-10-CM

## 2016-08-18 MED ORDER — BENZONATATE 100 MG PO CAPS: 100.0000 mg | ORAL_CAPSULE | Freq: Three times a day (TID) | ORAL | 0 refills | Status: DC | PRN

## 2016-08-18 MED ORDER — OSELTAMIVIR PHOSPHATE 75 MG PO CAPS: 75.0000 mg | ORAL_CAPSULE | Freq: Two times a day (BID) | ORAL | 0 refills | Status: DC

## 2016-08-18 MED ORDER — NAPROXEN 500 MG PO TABS: 500.0000 mg | ORAL_TABLET | Freq: Two times a day (BID) | ORAL | 0 refills | Status: DC | PRN

## 2016-08-18 NOTE — ED Provider Notes (Signed)
CSN: [id number]     Arrival date & time 08/18/16  1936 History   First MD Initiated Contact with Patient 08/18/16 2127     Chief Complaint  Patient presents with  . URI   (Consider location/radiation/quality/duration/timing/severity/associated sxs/prior Treatment) 25 year old female presents with sudden onset of body aches, headache, sore throat, cough, nasal congestion, right ear pain and possible fever for the past 2 days. Denies any GI symptoms. Has taken Ibuprofen 800mg  and Robitussin DM cough medication with no relief. No chronic health issues. Takes no daily medication.       History reviewed. No pertinent past medical history. History reviewed. No pertinent surgical history. Family History  Problem Relation Age of Onset  . Cancer Mother   . Diabetes Mother    Social History  Substance Use Topics  . Smoking status: Current Every Day Smoker  . Smokeless tobacco: Never Used  . Alcohol use Yes   OB History    No data available     Review of Systems  Constitutional: Positive for appetite change, chills and fatigue.  HENT: Positive for congestion, ear pain, postnasal drip, sinus pressure and sore throat. Negative for nosebleeds and sneezing.   Eyes: Negative for discharge.  Respiratory: Positive for cough. Negative for chest tightness, shortness of breath and wheezing.   Cardiovascular: Negative for chest pain.  Gastrointestinal: Negative for abdominal pain, diarrhea, nausea and vomiting.  Musculoskeletal: Positive for arthralgias and myalgias. Negative for neck pain and neck stiffness.  Neurological: Positive for headaches. Negative for dizziness, syncope, weakness, light-headedness and numbness.  Hematological: Negative for adenopathy.    Allergies  Review of patient's allergies indicates no known allergies.  Home Medications   Prior to Admission medications   Medication Sig Start Date End Date Taking? Authorizing Provider  benzonatate (TESSALON) 100 MG capsule  Take 1 capsule (100 mg total) by mouth 3 (three) times daily as needed for cough. 08/18/16   Katy Apo, NP  ibuprofen (ADVIL,MOTRIN) 800 MG tablet Take 1 tablet (800 mg total) by mouth 3 (three) times daily. 04/11/16   Melynda Ripple, MD  naproxen (NAPROSYN) 500 MG tablet Take 1 tablet (500 mg total) by mouth 2 (two) times daily as needed for moderate pain. 08/18/16   Katy Apo, NP  oseltamivir (TAMIFLU) 75 MG capsule Take 1 capsule (75 mg total) by mouth every 12 (twelve) hours. 08/18/16   Katy Apo, NP   Meds Ordered and Administered this Visit  Medications - No data to display  BP 121/76 (BP Location: Left Arm)   Pulse 106   Temp 98.6 F (37 C) (Oral)   Resp 18   LMP 07/29/2016   SpO2 99%  No data found.   Physical Exam  Constitutional: She is oriented to person, place, and time. She appears well-developed and well-nourished. She has a sickly appearance. No distress.  HENT:  Head: Normocephalic and atraumatic.  Right Ear: Hearing, tympanic membrane, external ear and ear canal normal.  Left Ear: Hearing, tympanic membrane, external ear and ear canal normal.  Nose: Mucosal edema and rhinorrhea present. Right sinus exhibits frontal sinus tenderness. Right sinus exhibits no maxillary sinus tenderness. Left sinus exhibits frontal sinus tenderness. Left sinus exhibits no maxillary sinus tenderness.  Mouth/Throat: Uvula is midline and mucous membranes are normal. Posterior oropharyngeal erythema present.  Neck: Normal range of motion. Neck supple.  Cardiovascular: Normal rate, regular rhythm and normal heart sounds.   Pulmonary/Chest: Effort normal. No respiratory distress. She has decreased breath sounds (  course breath sounds heard in upper lobes) in the right upper field and the left upper field. She has no wheezes. She has no rhonchi.  Lymphadenopathy:    She has no cervical adenopathy.  Neurological: She is alert and oriented to person, place, and time.  Skin: Skin  is warm and dry. Capillary refill takes less than 2 seconds. No rash noted.  Psychiatric: She has a normal mood and affect. Her behavior is normal. Judgment and thought content normal.    Urgent Care Course   Clinical Course    Procedures (including critical care time)  Labs Review Labs Reviewed - No data to display  Imaging Review No results found.   Visual Acuity Review  Right Eye Distance:   Left Eye Distance:   Bilateral Distance:    Right Eye Near:   Left Eye Near:    Bilateral Near:         MDM   1. Influenza    Discussed that patient most likely has influenza or influenza-like illness. Discussed antiviral medication and patient would like to take medication despite cost. Start Tamiflu 75mg  twice a day as directed. May take Tessalon cough pills every 8 hours as needed for cough. Take Naproxen twice a day as needed for headache and body aches. Rest. Increase fluid intake and follow-up with a primary care provider if not improving in 3 to 4 days.      Katy Apo, NP 08/19/16 [phone]

## 2016-08-18 NOTE — ED Triage Notes (Signed)
Patient has head and body aches, dry cough, sore throat and right ear pain.  Patient has no fever.  Patient has not had any antipyretics today ("not working").  Patient reports symptoms for 2 days. Patient has been using robitussin dm.

## 2016-08-18 NOTE — Discharge Instructions (Signed)
Recommend start Tamiflu twice a day as directed. Use Tessalon cough pills every 8 hours as needed for cough. Take Naproxen twice a day as needed for headache and body aches. Rest. Increase fluid intake and follow-up with a primary care provider if not improving within 3 to 4 days.

## 2016-08-19 ENCOUNTER — Ambulatory Visit: Payer: Self-pay | Admitting: Physician Assistant

## 2016-08-20 ENCOUNTER — Encounter (HOSPITAL_COMMUNITY): Payer: Self-pay | Admitting: *Deleted

## 2016-08-20 ENCOUNTER — Ambulatory Visit (INDEPENDENT_AMBULATORY_CARE_PROVIDER_SITE_OTHER): Payer: Self-pay

## 2016-08-20 ENCOUNTER — Ambulatory Visit: Payer: Self-pay | Admitting: Physician Assistant

## 2016-08-20 ENCOUNTER — Ambulatory Visit (HOSPITAL_COMMUNITY)
Admission: EM | Admit: 2016-08-20 | Discharge: 2016-08-20 | Disposition: A | Discharge status: Home / Self Care | Payer: Self-pay | Attending: Family Medicine | Admitting: Family Medicine

## 2016-08-20 DIAGNOSIS — R69 Illness, unspecified: Secondary | ICD-10-CM

## 2016-08-20 DIAGNOSIS — J111 Influenza due to unidentified influenza virus with other respiratory manifestations: Secondary | ICD-10-CM

## 2016-08-20 DIAGNOSIS — J189 Pneumonia, unspecified organism: Secondary | ICD-10-CM

## 2016-08-20 MED ORDER — AZITHROMYCIN 250 MG PO TABS: ORAL_TABLET | ORAL | 0 refills | Status: DC

## 2016-08-20 NOTE — ED Triage Notes (Addendum)
Patient reports cough, bodyaches, and mild sore throat since Sunday. States she was here on Sunday and the only thing that has changed since then is her headache is relieved. States poor appetite due to nausea and cough. Unsure of fevers. Patient did not feel prescription for tamiflu due to cost.

## 2016-08-20 NOTE — ED Provider Notes (Signed)
CSN: [id number]     Arrival date & time 08/20/16  1109 History   First MD Initiated Contact with Patient 08/20/16 1150     Chief Complaint  Patient presents with  . Cough   (Consider location/radiation/quality/duration/timing/severity/associated sxs/prior Treatment) HPI UNRESOLVED  PT IS 25 Y/O DX WITH FLU LIKE SXS. STARTED TAMIFLU AND IS NO BETTER. PT STATES HER COUGH IS WORSE, YELLOW SPUTUM, WITH LOW GRADE TEMP. NOW WITH WHEEZING.  History reviewed. No pertinent past medical history. History reviewed. No pertinent surgical history. Family History  Problem Relation Age of Onset  . Cancer Mother   . Diabetes Mother    Social History  Substance Use Topics  . Smoking status: Current Every Day Smoker  . Smokeless tobacco: Never Used  . Alcohol use Yes   OB History    No data available     Review of Systems  Denies: HEADACHE, NAUSEA, ABDOMINAL PAIN, CHEST PAIN, CONGESTION, DYSURIA, SHORTNESS OF BREATH  Allergies  Review of patient's allergies indicates no known allergies.  Home Medications   Prior to Admission medications   Medication Sig Start Date End Date Taking? Authorizing Provider  benzonatate (TESSALON) 100 MG capsule Take 1 capsule (100 mg total) by mouth 3 (three) times daily as needed for cough. 08/18/16  Yes Katy Apo, NP  naproxen (NAPROSYN) 500 MG tablet Take 1 tablet (500 mg total) by mouth 2 (two) times daily as needed for moderate pain. 08/18/16  Yes Katy Apo, NP  ibuprofen (ADVIL,MOTRIN) 800 MG tablet Take 1 tablet (800 mg total) by mouth 3 (three) times daily. 04/11/16   Melynda Ripple, MD  oseltamivir (TAMIFLU) 75 MG capsule Take 1 capsule (75 mg total) by mouth every 12 (twelve) hours. 08/18/16   Katy Apo, NP   Meds Ordered and Administered this Visit  Medications - No data to display  BP 116/79 (BP Location: Left Arm)   Pulse 98   Temp 98.7 F (37.1 C)   Resp 17   LMP 07/29/2016   SpO2 100%  No data found.   Physical  Exam NURSES NOTES AND VITAL SIGNS REVIEWED. CONSTITUTIONAL: Well developed, well nourished, no acute distress HEENT: normocephalic, atraumatic EYES: Conjunctiva normal NECK:normal ROM, supple, no adenopathy PULMONARY:No respiratory distress, normal effort, WHEEZING ABDOMINAL: Soft, ND, NT BS+, No CVAT MUSCULOSKELETAL: Normal ROM of all extremities,  SKIN: warm and dry without rash PSYCHIATRIC: Mood and affect, behavior are normal  Urgent Care Course   Clinical Course   CXR FOR WHEEZING AND SPUTUM.  Procedures (including critical care time)  Labs Review Labs Reviewed - No data to display  Imaging Review Dg Chest 2 View  Result Date: 08/20/2016 CLINICAL DATA:  Productive cough EXAM: CHEST  2 VIEW COMPARISON:  None. FINDINGS: The heart size and mediastinal contours are within normal limits. Both lungs are clear. The visualized skeletal structures are unremarkable. IMPRESSION: No active cardiopulmonary disease. Electronically Signed   By: Kathreen Devoid   On: 08/20/2016 12:19     Visual Acuity Review  Right Eye Distance:   Left Eye Distance:   Bilateral Distance:    Right Eye Near:   Left Eye Near:    Bilateral Near:         MDM   1. Community acquired pneumonia, unspecified laterality   2. Influenza-like illness     Patient is reassured that there are no issues that require transfer to higher level of care at this time or additional tests. Patient is advised to continue home  symptomatic treatment. Patient is advised that if there are new or worsening symptoms to attend the emergency department, contact primary care provider, or return to UC. Instructions of care provided discharged home in stable condition.    THIS NOTE WAS GENERATED USING A VOICE RECOGNITION SOFTWARE PROGRAM. ALL REASONABLE EFFORTS  WERE MADE TO PROOFREAD THIS DOCUMENT FOR ACCURACY.  I have verbally reviewed the discharge instructions with the patient. A printed AVS was given to the patient.  All  questions were answered prior to discharge.      Konrad Felix, [place] 08/20/16 1243

## 2016-11-08 ENCOUNTER — Ambulatory Visit (HOSPITAL_COMMUNITY)
Admission: EM | Admit: 2016-11-08 | Discharge: 2016-11-08 | Disposition: A | Discharge status: Home / Self Care | Payer: 59 | Attending: Family Medicine | Admitting: Family Medicine

## 2016-11-08 ENCOUNTER — Encounter (HOSPITAL_COMMUNITY): Payer: Self-pay | Admitting: Emergency Medicine

## 2016-11-08 DIAGNOSIS — J020 Streptococcal pharyngitis: Secondary | ICD-10-CM | POA: Diagnosis not present

## 2016-11-08 MED ORDER — AMOXICILLIN 500 MG PO CAPS: 500.0000 mg | ORAL_CAPSULE | Freq: Three times a day (TID) | ORAL | 0 refills | Status: DC

## 2016-11-08 NOTE — Discharge Instructions (Signed)
Drink lots of fluids, take all of medicine, use lozenges as needed.return if needed °

## 2016-11-08 NOTE — ED Triage Notes (Addendum)
The patient presented to the [facility] with a complaint of a sore throat that stated yesterday. The patient denied any fever.

## 2016-11-08 NOTE — ED Provider Notes (Signed)
[facility]    CSN: [id number] Arrival date & time: 11/08/16  1007     History   Chief Complaint Chief Complaint  Patient presents with  . Sore Throat    HPI [facility] is a 26 y.o. female.   The history is provided by the patient.  Sore Throat  This is a new problem. The current episode started yesterday. The problem has been gradually worsening. Pertinent negatives include no chest pain, no abdominal pain, no headaches and no shortness of breath. The symptoms are aggravated by swallowing.    History reviewed. No pertinent past medical history.  There are no active problems to display for this patient.   History reviewed. No pertinent surgical history.  OB History    No data available       Home Medications    Prior to Admission medications   Medication Sig Start Date End Date Taking? Authorizing Provider  amoxicillin (AMOXIL) 500 MG capsule Take 1 capsule (500 mg total) by mouth 3 (three) times daily. 11/08/16   Billy Fischer, MD    Family History Family History  Problem Relation Age of Onset  . Cancer Mother   . Diabetes Mother     Social History Social History  Substance Use Topics  . Smoking status: Current Every Day Smoker  . Smokeless tobacco: Never Used  . Alcohol use Yes     Allergies   Patient has no known allergies.   Review of Systems Review of Systems  Constitutional: Negative.   HENT: Positive for sore throat.   Respiratory: Negative.  Negative for shortness of breath.   Cardiovascular: Negative.  Negative for chest pain.  Gastrointestinal: Negative for abdominal pain.  Neurological: Negative for headaches.  Hematological: Positive for adenopathy.  All other systems reviewed and are negative.    Physical Exam Triage Vital Signs ED Triage Vitals  Enc Vitals Group     BP 11/08/16 1017 130/85     Pulse Rate 11/08/16 1017 93     Resp 11/08/16 1017 12     Temp 11/08/16 1017 98.7 F (37.1 C)     Temp Source  11/08/16 1017 Oral     SpO2 11/08/16 1017 98 %     Weight --      Height --      Head Circumference --      Peak Flow --      Pain Score 11/08/16 1023 8     Pain Loc --      Pain Edu? --      Excl. in [place]? --    No data found.   Updated Vital Signs BP 130/85 (BP Location: Left Arm)   Pulse 93   Temp 98.7 F (37.1 C) (Oral)   Resp 12   SpO2 98%   Visual Acuity Right Eye Distance:   Left Eye Distance:   Bilateral Distance:    Right Eye Near:   Left Eye Near:    Bilateral Near:     Physical Exam  Constitutional: She appears well-developed and well-nourished. No distress.  HENT:  Mouth/Throat: Oropharyngeal exudate present.  Eyes: Pupils are equal, round, and reactive to light.  Pulmonary/Chest: Effort normal and breath sounds normal.  Lymphadenopathy:    She has cervical adenopathy.  Skin: Skin is warm and dry.  Nursing note and vitals reviewed.    UC Treatments / Results  Labs (all labs ordered are listed, but only abnormal results are displayed) Labs Reviewed - No data  to display  EKG  EKG Interpretation None       Radiology No results found.  Procedures Procedures (including critical care time)  Medications Ordered in UC Medications - No data to display   Initial Impression / Assessment and Plan / UC Course  I have reviewed the triage vital signs and the nursing notes.  Pertinent labs & imaging results that were available during my care of the patient were reviewed by me and considered in my medical decision making (see chart for details).       Final Clinical Impressions(s) / UC Diagnoses   Final diagnoses:  Strep pharyngitis    New Prescriptions Discharge Medication List as of 11/08/2016 10:38 AM    START taking these medications   Details  amoxicillin (AMOXIL) 500 MG capsule Take 1 capsule (500 mg total) by mouth 3 (three) times daily., Starting Fri 11/08/2016, Normal         Billy Fischer, MD 11/26/16 2145

## 2017-02-23 ENCOUNTER — Emergency Department (HOSPITAL_COMMUNITY): Payer: 59

## 2017-02-23 ENCOUNTER — Encounter (HOSPITAL_COMMUNITY): Payer: Self-pay

## 2017-02-23 ENCOUNTER — Emergency Department (HOSPITAL_COMMUNITY)
Admission: EM | Admit: 2017-02-23 | Discharge: 2017-02-23 | Disposition: A | Discharge status: Home / Self Care | Payer: 59 | Attending: Emergency Medicine | Admitting: Emergency Medicine

## 2017-02-23 DIAGNOSIS — B9689 Other specified bacterial agents as the cause of diseases classified elsewhere: Secondary | ICD-10-CM | POA: Diagnosis not present

## 2017-02-23 DIAGNOSIS — R102 Pelvic and perineal pain: Secondary | ICD-10-CM | POA: Diagnosis not present

## 2017-02-23 DIAGNOSIS — R1110 Vomiting, unspecified: Secondary | ICD-10-CM | POA: Diagnosis not present

## 2017-02-23 DIAGNOSIS — N760 Acute vaginitis: Secondary | ICD-10-CM | POA: Insufficient documentation

## 2017-02-23 DIAGNOSIS — N83201 Unspecified ovarian cyst, right side: Secondary | ICD-10-CM | POA: Insufficient documentation

## 2017-02-23 DIAGNOSIS — R109 Unspecified abdominal pain: Secondary | ICD-10-CM | POA: Diagnosis not present

## 2017-02-23 LAB — URINALYSIS, ROUTINE W REFLEX MICROSCOPIC
BACTERIA UA: NONE SEEN
Bilirubin Urine: NEGATIVE
Glucose, UA: NEGATIVE mg/dL
Ketones, ur: NEGATIVE mg/dL
Leukocytes, UA: NEGATIVE
Nitrite: NEGATIVE
Protein, ur: NEGATIVE mg/dL
SPECIFIC GRAVITY, URINE: 1.009 (ref 1.005–1.030)
pH: 7 (ref 5.0–8.0)

## 2017-02-23 LAB — COMPREHENSIVE METABOLIC PANEL
ALK PHOS: 61 U/L (ref 38–126)
ALT: 10 U/L — ABNORMAL LOW (ref 14–54)
ANION GAP: 10 (ref 5–15)
AST: 16 U/L (ref 15–41)
Albumin: 4.1 g/dL (ref 3.5–5.0)
BILIRUBIN TOTAL: 0.4 mg/dL (ref 0.3–1.2)
BUN: 6 mg/dL (ref 6–20)
CALCIUM: 9.1 mg/dL (ref 8.9–10.3)
CO2: 22 mmol/L (ref 22–32)
CREATININE: 0.71 mg/dL (ref 0.44–1.00)
Chloride: 105 mmol/L (ref 101–111)
GFR calc non Af Amer: >60 mL/min (ref >60)
GLUCOSE: 106 mg/dL — AB (ref 65–99)
POTASSIUM: 3.6 mmol/L (ref 3.5–5.1)
SODIUM: 137 mmol/L (ref 135–145)
Total Protein: 7.3 g/dL (ref 6.5–8.1)

## 2017-02-23 LAB — CBC WITH DIFFERENTIAL/PLATELET
BASOS ABS: 0 10*3/uL (ref 0.0–0.1)
BASOS PCT: 0 %
EOS ABS: 0 10*3/uL (ref 0.0–0.7)
Eosinophils Relative: 0 %
HEMATOCRIT: 33.9 % — AB (ref 36.0–46.0)
HEMOGLOBIN: 11.3 g/dL — AB (ref 12.0–15.0)
Lymphocytes Relative: 15 %
Lymphs Abs: 1.7 10*3/uL (ref 0.7–4.0)
MCH: 27.2 pg (ref 26.0–34.0)
MCHC: 33.3 g/dL (ref 30.0–36.0)
MCV: 81.7 fL (ref 78.0–100.0)
MONO ABS: 0.3 10*3/uL (ref 0.1–1.0)
Monocytes Relative: 3 %
NEUTROS ABS: 9 10*3/uL — AB (ref 1.7–7.7)
NEUTROS PCT: 82 %
Platelets: 343 10*3/uL (ref 150–400)
RBC: 4.15 MIL/uL (ref 3.87–5.11)
RDW: 14.6 % (ref 11.5–15.5)
WBC: 11 10*3/uL — ABNORMAL HIGH (ref 4.0–10.5)

## 2017-02-23 LAB — I-STAT BETA HCG BLOOD, ED (MC, WL, AP ONLY)

## 2017-02-23 LAB — LIPASE, BLOOD: Lipase: 28 U/L (ref 11–51)

## 2017-02-23 LAB — WET PREP, GENITAL
Sperm: NONE SEEN
TRICH WET PREP: NONE SEEN
YEAST WET PREP: NONE SEEN

## 2017-02-23 MED ORDER — MORPHINE SULFATE (PF) 4 MG/ML IV SOLN
4.0000 mg | INTRAVENOUS | Status: DC | PRN
  Administered 2017-02-23: 4 mg via INTRAVENOUS
  Filled 2017-02-23: qty 1

## 2017-02-23 MED ORDER — ONDANSETRON 4 MG PO TBDP: 4.0000 mg | ORAL_TABLET | Freq: Three times a day (TID) | ORAL | 0 refills | Status: DC | PRN

## 2017-02-23 MED ORDER — IOPAMIDOL (ISOVUE-300) INJECTION 61%
INTRAVENOUS | Status: AC
  Administered 2017-02-23: 100 mL
  Filled 2017-02-23: qty 100

## 2017-02-23 MED ORDER — OXYCODONE-ACETAMINOPHEN 5-325 MG PO TABS: ORAL_TABLET | ORAL | 0 refills | Status: DC

## 2017-02-23 MED ORDER — METRONIDAZOLE 500 MG PO TABS: 500.0000 mg | ORAL_TABLET | Freq: Two times a day (BID) | ORAL | 0 refills | Status: DC

## 2017-02-23 MED ORDER — MORPHINE SULFATE (PF) 4 MG/ML IV SOLN: 4.0000 mg | INTRAVENOUS | Status: DC | PRN

## 2017-02-23 MED ORDER — ONDANSETRON HCL 4 MG/2ML IJ SOLN
4.0000 mg | Freq: Once | INTRAMUSCULAR | Status: AC
  Administered 2017-02-23: 4 mg via INTRAVENOUS
  Filled 2017-02-23: qty 2

## 2017-02-23 NOTE — ED Notes (Signed)
Patient transported to CT 

## 2017-02-23 NOTE — ED Triage Notes (Signed)
Patient complains of RLQ pain x 1 week. Nausea with same. No vomiting, no diarrhea. Pain worse with ambulation

## 2017-02-23 NOTE — ED Notes (Signed)
Pt is in stable condition upon d/c and ambulates from ED. 

## 2017-02-23 NOTE — Discharge Instructions (Signed)
Take the prescriptions as directed.  Apply moist heat to the area(s) of discomfort, for 15 minutes at a time, several times per day for the next few days.  Do not fall asleep on a heating pack.  Call the OB/GYN doctor tomorrow morning to schedule a follow up appointment this week.  Return to the Emergency Department immediately if worsening.

## 2017-02-23 NOTE — ED Provider Notes (Signed)
[facility] DEPT Provider Note   CSN: [id number] Arrival date & time: 02/23/17  1251     History   Chief Complaint No chief complaint on file.   HPI [facility] is a 26 y.o. female.  HPI  Pt was seen at 1320.  Per pt, c/o gradual onset and worsening of constant right sided abd "pain" for the past 1 week, worse since yesterday.  Has been associated with nausea. States the pain worsens with ambulation.  Denies vomiting/diarrhea, no fevers, no back pain, no rash, no CP/SOB, no black or blood in stools, no dysuria/hematuria, no vaginal bleeding/discharge. LMP 3 days ago.      History reviewed. No pertinent past medical history.  There are no active problems to display for this patient.   History reviewed. No pertinent surgical history.  OB History    No data available       Home Medications    Prior to Admission medications   Medication Sig Start Date End Date Taking? Authorizing Provider  amoxicillin (AMOXIL) 500 MG capsule Take 1 capsule (500 mg total) by mouth 3 (three) times daily. 11/08/16   Billy Fischer, MD    Family History Family History  Problem Relation Age of Onset  . Cancer Mother   . Diabetes Mother     Social History Social History  Substance Use Topics  . Smoking status: Current Every Day Smoker  . Smokeless tobacco: Never Used  . Alcohol use Yes     Allergies   Patient has no known allergies.   Review of Systems Review of Systems ROS: Statement: All systems negative except as marked or noted in the HPI; Constitutional: Negative for fever and chills. ; ; Eyes: Negative for eye pain, redness and discharge. ; ; ENMT: Negative for ear pain, hoarseness, nasal congestion, sinus pressure and sore throat. ; ; Cardiovascular: Negative for chest pain, palpitations, diaphoresis, dyspnea and peripheral edema. ; ; Respiratory: Negative for cough, wheezing and stridor. ; ; Gastrointestinal: +nausea, abd pain. Negative for vomiting, diarrhea, blood in  stool, hematemesis, jaundice and rectal bleeding. . ; ; Genitourinary: Negative for dysuria, flank pain and hematuria. ; ; GYN:  +pelvic pain, no vaginal bleeding, no vaginal discharge, no vulvar pain. ;; Musculoskeletal: Negative for back pain and neck pain. Negative for swelling and trauma.; ; Skin: Negative for pruritus, rash, abrasions, blisters, bruising and skin lesion.; ; Neuro: Negative for headache, lightheadedness and neck stiffness. Negative for weakness, altered level of consciousness, altered mental status, extremity weakness, paresthesias, involuntary movement, seizure and syncope.       Physical Exam Updated Vital Signs BP 118/66   Pulse 79   Temp 98.4 F (36.9 C) (Oral)   Resp 18   SpO2 100%   Physical Exam 1325: Physical examination:  Nursing notes reviewed; Vital signs and O2 SAT reviewed;  Constitutional: Well developed, Well nourished, Well hydrated, In no acute distress; Head:  Normocephalic, atraumatic; Eyes: EOMI, PERRL, No scleral icterus; ENMT: Mouth and pharynx normal, Mucous membranes moist; Neck: Supple, Full range of motion, No lymphadenopathy; Cardiovascular: Regular rate and rhythm, No gallop; Respiratory: Breath sounds clear & equal bilaterally, No wheezes.  Speaking full sentences with ease, Normal respiratory effort/excursion; Chest: Nontender, Movement normal; Abdomen: Soft, +RUQ, RLQ > LLQ, suprapubic tenderness to palp. No rebound or guarding. Nondistended, Normal bowel sounds; Genitourinary: No CVA tenderness. Pelvic exam performed with permission of pt and female ED tech assist during exam.  External genitalia w/o lesions. Vaginal vault with thin white discharge.  Cervix w/o lesions, not friable, GC/chlam and wet prep obtained and sent to lab.  Bimanual exam w/o CMT, uterine or left adnexal tenderness. +right pelvic tenderness.;; Extremities: Pulses normal, No tenderness, No edema, No calf edema or asymmetry.; Neuro: AA&Ox3, Major CN grossly intact.  Speech  clear. No gross focal motor or sensory deficits in extremities.; Skin: Color normal, Warm, Dry.    ED Treatments / Results  Labs (all labs ordered are listed, but only abnormal results are displayed)   EKG  EKG Interpretation None       Radiology   Procedures Procedures (including critical care time)  Medications Ordered in ED Medications  morphine 4 MG/ML injection 4 mg (not administered)  ondansetron (ZOFRAN) injection 4 mg (not administered)     Initial Impression / Assessment and Plan / ED Course  I have reviewed the triage vital signs and the nursing notes.  Pertinent labs & imaging results that were available during my care of the patient were reviewed by me and considered in my medical decision making (see chart for details).  MDM Reviewed: previous chart, nursing note and vitals Reviewed previous: labs Interpretation: labs, ultrasound and CT scan   Results for orders placed or performed during the hospital encounter of 02/23/17  Wet prep, genital  Result Value Ref Range   Yeast Wet Prep HPF POC NONE SEEN NONE SEEN   Trich, Wet Prep NONE SEEN NONE SEEN   Clue Cells Wet Prep HPF POC PRESENT (A) NONE SEEN   WBC, Wet Prep HPF POC MODERATE (A) NONE SEEN   Sperm NONE SEEN   Lipase, blood  Result Value Ref Range   Lipase 28 11 - 51 U/L  Comprehensive metabolic panel  Result Value Ref Range   Sodium 137 135 - 145 mmol/L   Potassium 3.6 3.5 - 5.1 mmol/L   Chloride 105 101 - 111 mmol/L   CO2 22 22 - 32 mmol/L   Glucose, Bld 106 (H) 65 - 99 mg/dL   BUN 6 6 - 20 mg/dL   Creatinine, Ser 0.71 0.44 - 1.00 mg/dL   Calcium 9.1 8.9 - 10.3 mg/dL   Total Protein 7.3 6.5 - 8.1 g/dL   Albumin 4.1 3.5 - 5.0 g/dL   AST 16 15 - 41 U/L   ALT 10 (L) 14 - 54 U/L   Alkaline Phosphatase 61 38 - 126 U/L   Total Bilirubin 0.4 0.3 - 1.2 mg/dL   GFR calc non Af Amer >60 >60 mL/min   GFR calc Af Amer >60 >60 mL/min   Anion gap 10 5 - 15  Urinalysis, Routine w reflex  microscopic  Result Value Ref Range   Color, Urine STRAW (A) YELLOW   APPearance CLEAR CLEAR   Specific Gravity, Urine 1.009 1.005 - 1.030   pH 7.0 5.0 - 8.0   Glucose, UA NEGATIVE NEGATIVE mg/dL   Hgb urine dipstick SMALL (A) NEGATIVE   Bilirubin Urine NEGATIVE NEGATIVE   Ketones, ur NEGATIVE NEGATIVE mg/dL   Protein, ur NEGATIVE NEGATIVE mg/dL   Nitrite NEGATIVE NEGATIVE   Leukocytes, UA NEGATIVE NEGATIVE   RBC / HPF 0-5 0 - 5 RBC/hpf   WBC, UA 0-5 0 - 5 WBC/hpf   Bacteria, UA NONE SEEN NONE SEEN   Squamous Epithelial / LPF 0-5 (A) NONE SEEN   Mucous PRESENT   CBC with Differential  Result Value Ref Range   WBC 11.0 (H) 4.0 - 10.5 K/uL   RBC 4.15 3.87 - 5.11 MIL/uL  Hemoglobin 11.3 (L) 12.0 - 15.0 g/dL   HCT 33.9 (L) 36.0 - 46.0 %   MCV 81.7 78.0 - 100.0 fL   MCH 27.2 26.0 - 34.0 pg   MCHC 33.3 30.0 - 36.0 g/dL   RDW 14.6 11.5 - 15.5 %   Platelets 343 150 - 400 K/uL   Neutrophils Relative % 82 %   Neutro Abs 9.0 (H) 1.7 - 7.7 K/uL   Lymphocytes Relative 15 %   Lymphs Abs 1.7 0.7 - 4.0 K/uL   Monocytes Relative 3 %   Monocytes Absolute 0.3 0.1 - 1.0 K/uL   Eosinophils Relative 0 %   Eosinophils Absolute 0.0 0.0 - 0.7 K/uL   Basophils Relative 0 %   Basophils Absolute 0.0 0.0 - 0.1 K/uL  I-Stat beta hCG blood, ED  Result Value Ref Range   I-stat hCG, quantitative <5.0 <5 mIU/mL   Comment 3           Ct Abdomen Pelvis W Contrast Result Date: 02/23/2017 CLINICAL DATA:  Nausea, vomiting, right-sided pain EXAM: CT ABDOMEN AND PELVIS WITH CONTRAST TECHNIQUE: Multidetector CT imaging of the abdomen and pelvis was performed using the standard protocol following bolus administration of intravenous contrast. CONTRAST:  143mL ISOVUE-300 IOPAMIDOL (ISOVUE-300) INJECTION 61% COMPARISON:  None. FINDINGS: Lower chest: No acute abnormality. Hepatobiliary: No focal liver abnormality is seen. No gallstones, gallbladder wall thickening, or biliary dilatation. Pancreas: Unremarkable. No  pancreatic ductal dilatation or surrounding inflammatory changes. Spleen: Normal in size without focal abnormality. Adrenals/Urinary Tract: Adrenal glands are unremarkable. Kidneys are normal, without renal calculi, focal lesion, or hydronephrosis. Bladder is unremarkable. Stomach/Bowel: Stomach is within normal limits. Appendix appears normal. No evidence of bowel wall thickening, distention, or inflammatory changes. Vascular/Lymphatic: No significant vascular findings are present. No enlarged abdominal or pelvic lymph nodes. Reproductive: Normal uterus. 8 x 9 x 9.9 cm hypodense, fluid attenuating right adnexal mass. Other: No abdominal wall hernia or abnormality. No abdominopelvic ascites. Musculoskeletal: No acute or significant osseous findings. IMPRESSION: 1. No acute abdominal or pelvic pathology. 2. Large cystic right ovarian mass measuring 8 x 9 x 9.9 cm. This may reflect a large simple cyst versus serous cystadenoma. Recommend GYN consultation. Electronically Signed   By: Kathreen Devoid   On: 02/23/2017 15:55   [place] Art/ven Flow Abd Pelv Doppler Result Date: 02/23/2017 CLINICAL DATA:  Right pelvic pain for 1 day. EXAM: TRANSABDOMINAL AND TRANSVAGINAL ULTRASOUND OF PELVIS DOPPLER ULTRASOUND OF OVARIES TECHNIQUE: Both transabdominal and transvaginal ultrasound examinations of the pelvis were performed. Transabdominal technique was performed for global imaging of the pelvis including uterus, ovaries, adnexal regions, and pelvic cul-de-sac. It was necessary to proceed with endovaginal exam following the transabdominal exam to visualize the adnexa. Color and duplex Doppler ultrasound was utilized to evaluate blood flow to the ovaries. COMPARISON:  12/22/2009 FINDINGS: Uterus Measurements: 6.7 x 3.0 x 4.1 cm. No fibroids or other mass visualized. Endometrium Thickness: 8 mm.  No focal abnormality visualized. Right ovary Measurements: 10 x 7.1 x 9.8 cm. The enlargement of the right ovary is secondary to a large  circumscribed hypoechoic to anechoic mass which measures 8.9 x 6.4 x 7.9 cm. Low level internal echoes are seen within the mass. Left ovary Measurements: 4.2 x 3.3 x 3.0 cm. Normal appearance/no adnexal mass. Pulsed Doppler evaluation of both ovaries demonstrates normal low-resistance arterial and venous waveforms. Other findings Trace free fluid. IMPRESSION: Normal appearance of the uterus and the left ovary. The right ovary is enlarged secondary to an  8.9 cm circumscribed hypoechoic mass containing low level echoes. This may represent a hemorrhagic cyst, however if no surgical intervention is considered, follow-up ultrasound in 6-8 weeks is recommended to assure resolution or decrease of the size of this mass, as low grade ovarian malignancy cannot be entirely excluded. Preserved blood flow to bilateral ovaries. Electronically Signed   By: Fidela Salisbury M.D.   On: 02/23/2017 14:50    1620:  Pt feels better after meds and is ready to go home now.  T/C to OB/GYN Dr. Ihor Dow, case discussed, including:  HPI, pertinent PM/SHx, VS/PE, dx testing, ED course and treatment:  Pain control, f/u in office as outpatient this week.  Dx and testing, as well as d/w OB/GYN MD, d/w pt and family.  Questions answered.  Verb understanding, agreeable to d/c home with outpt f/u.     Final Clinical Impressions(s) / ED Diagnoses   Final diagnoses:  Pelvic pain  Pelvic pain    New Prescriptions New Prescriptions   No medications on file     Francine Graven, DO 02/26/17 1936

## 2017-02-24 LAB — GC/CHLAMYDIA PROBE AMP (~~LOC~~) NOT AT ARMC
Chlamydia: NEGATIVE
Neisseria Gonorrhea: NEGATIVE

## 2017-02-26 ENCOUNTER — Encounter: Payer: Self-pay | Admitting: Obstetrics and Gynecology

## 2017-02-26 ENCOUNTER — Ambulatory Visit (INDEPENDENT_AMBULATORY_CARE_PROVIDER_SITE_OTHER): Payer: 59 | Admitting: Obstetrics and Gynecology

## 2017-02-26 ENCOUNTER — Telehealth (HOSPITAL_COMMUNITY): Payer: Self-pay

## 2017-02-26 VITALS — BP 129/92 | HR 69 | Ht 65.0 in | Wt 244.3 lb

## 2017-02-26 DIAGNOSIS — Z124 Encounter for screening for malignant neoplasm of cervix: Secondary | ICD-10-CM

## 2017-02-26 DIAGNOSIS — R1031 Right lower quadrant pain: Secondary | ICD-10-CM

## 2017-02-26 DIAGNOSIS — N83201 Unspecified ovarian cyst, right side: Secondary | ICD-10-CM

## 2017-02-26 DIAGNOSIS — R87613 High grade squamous intraepithelial lesion on cytologic smear of cervix (HGSIL): Secondary | ICD-10-CM | POA: Diagnosis not present

## 2017-02-26 DIAGNOSIS — Z1151 Encounter for screening for human papillomavirus (HPV): Secondary | ICD-10-CM | POA: Diagnosis not present

## 2017-02-26 DIAGNOSIS — E669 Obesity, unspecified: Secondary | ICD-10-CM | POA: Insufficient documentation

## 2017-02-26 DIAGNOSIS — Z6841 Body Mass Index (BMI) 40.0 and over, adult: Secondary | ICD-10-CM | POA: Diagnosis not present

## 2017-02-26 MED ORDER — KETOROLAC TROMETHAMINE 10 MG PO TABS: 10.0000 mg | ORAL_TABLET | Freq: Three times a day (TID) | ORAL | 0 refills | Status: AC

## 2017-02-26 MED ORDER — OXYCODONE-ACETAMINOPHEN 5-325 MG PO TABS: ORAL_TABLET | ORAL | 0 refills | Status: DC

## 2017-02-26 NOTE — Telephone Encounter (Signed)
Called and spoke w/ [facility]. Advised her that her surgery has been scheduled for Monday 03/03/2017 @ 0830 (arrive at [facility] through either main entrance or maternity admissions- depending if the doors are unlocked at 0700). Nothing to eat or drink after she goes to bed on Sunday night. No lotions, powders, or perfumes, (a little bit of deodorant is okay). Instructed patient to remove all jewelry before arriving. Advised patient that pre-op may be giving her a call tomorrow or the next day to go over instructions in detail. Instructed patient to call me back at the office if she had any issues, questions or concerns. Patient expressed understanding.

## 2017-02-26 NOTE — Progress Notes (Addendum)
Obstetrics and Gynecology New Patient Evaluation  Appointment Date: 02/26/2017  OBGYN Clinic: [facility]  Primary Care Provider or OBGYN: None  Referring Provider: Emergency Department  Chief Complaint:  RO cyst, ER follow up History of Present Illness: Brandi Andrews is a 26 y.o. African-American G1P0010 (Patient's last menstrual period was 02/15/2017.), seen for the above chief complaint. Her past medical history is significant for BMI 31.  Patient seen in the ED on 4/29 for RLQ pain. CT shows RLQ adnexa cyst but otherwise normal. Confirmatory u/s showed 9cm an- to hypoechoic ovarian cyst with overall size of RO 10cm, +AV flow seen to both sides and trace FF noted. Patient discharged to home with outpatient follow up.  She states the pain is still in the RLQ and feels the same as in the ER. The narcotics she got an Rx for helps but the intensity is the same. No radiation, it feels sharp at times but the pain is always there. No nausea, vomiting, fevers, chills, GI s/s or LUTs, chest pain, SOB, diarrhea, constipation, dysuria, hematuria, vag bleeding, itching or discharge.      Review of Systems:  as noted in the History of Present Illness.   Past Medical History:  Past Medical History:  Diagnosis Date  . Obesity     Past Surgical History:  No past surgical history on file.  Past Obstetrical History:  OB History  Gravida Para Term Preterm AB Living  1       1 0  SAB TAB Ectopic Multiple Live Births  1       0    # Outcome Date GA Lbr Len/2nd Weight Sex Delivery Anes PTL Lv  1 SAB             Obstetric Comments  G1: 2012 early SAB    Past Gynecological History: As per HPI. Periods: qmonth, 5 days, regular, not heavy or painful No. history of abnormal pap smears No. history of STIs.   She is currently using none for contraception.   Social History:  Social History   Social History  . Marital status: Single    Spouse name: N/A  . Number of  children: N/A  . Years of education: N/A   Occupational History  . Not on file.   Social History Main Topics  . Smoking status: Current Every Day Smoker  . Smokeless tobacco: Never Used  . Alcohol use Yes  . Drug use: No  . Sexual activity: No   Other Topics Concern  . Not on file   Social History Narrative  . No narrative on file    Family History:  Family History  Problem Relation Age of Onset  . Diabetes Mother   . Breast cancer Mother 57  ?cousin with cervical cancer  Medications Ms. Sabourin had no medications administered during this visit. Current Outpatient Prescriptions  Medication Sig Dispense Refill  . oxyCODONE-acetaminophen (PERCOCET/ROXICET) 5-325 MG tablet 1 or 2 tabs PO q6h prn pain 15 tablet 0  . ondansetron (ZOFRAN ODT) 4 MG disintegrating tablet Take 1 tablet (4 mg total) by mouth every 8 (eight) hours as needed for nausea or vomiting. (Patient not taking: Reported on 02/26/2017) 6 tablet 0   No current facility-administered medications for this visit.     Allergies Mushroom extract complex   Physical Exam:  BP (!) 129/92   Pulse 69   Ht 5\' 5"  (1.651 m)   Wt 244 lb 4.8 oz (110.8 kg)   LMP  02/15/2017   BMI 40.65 kg/m  Body mass index is 40.65 kg/m.  General appearance: Well nourished, well developed female in no acute distress.  Neck:  Supple, normal appearance, and no thyromegaly  Cardiovascular: normal s1 and s2.  No murmurs, rubs or gallops. Respiratory:  Clear to auscultation bilateral. Normal respiratory effort Abdomen: obese, positive bowel sounds and no masses, hernias; diffusely non tender to palpation, non distended Neuro/Psych:  Normal mood and affect.  Skin:  Warm and dry.  Lymphatic:  No inguinal lymphadenopathy.   Pelvic exam: is limited by body habitus EGBUS: within normal limits, Vagina: within normal limits and with no blood or discharge in the vault, Cervix: normal appearing cervix without tenderness, discharge or lesions.  Uterus:  nonenlarged and non tender and Adnexa:  normal adnexa with mildly TTP in the RLQ but no peritoneal s/s Rectovaginal: deferred  Laboratory: ER labs neg gc/ct, wet prep, cmp, lipase u/a with small blood CBC Latest Ref Rng & Units 02/23/2017  WBC 4.0 - 10.5 K/uL 11.0(H)  Hemoglobin 12.0 - 15.0 g/dL 11.3(L)  Hematocrit 36.0 - 46.0 % 33.9(L)  Platelets 150 - 400 K/uL 343   Radiology:  CLINICAL DATA:  Nausea, vomiting, right-sided pain  EXAM: CT ABDOMEN AND PELVIS WITH CONTRAST  TECHNIQUE: Multidetector CT imaging of the abdomen and pelvis was performed using the standard protocol following bolus administration of intravenous contrast.  CONTRAST:  163mL ISOVUE-300 IOPAMIDOL (ISOVUE-300) INJECTION 61%  COMPARISON:  None.  FINDINGS: Lower chest: No acute abnormality.  Hepatobiliary: No focal liver abnormality is seen. No gallstones, gallbladder wall thickening, or biliary dilatation.  Pancreas: Unremarkable. No pancreatic ductal dilatation or surrounding inflammatory changes.  Spleen: Normal in size without focal abnormality.  Adrenals/Urinary Tract: Adrenal glands are unremarkable. Kidneys are normal, without renal calculi, focal lesion, or hydronephrosis. Bladder is unremarkable.  Stomach/Bowel: Stomach is within normal limits. Appendix appears normal. No evidence of bowel wall thickening, distention, or inflammatory changes.  Vascular/Lymphatic: No significant vascular findings are present. No enlarged abdominal or pelvic lymph nodes.  Reproductive: Normal uterus. 8 x 9 x 9.9 cm hypodense, fluid attenuating right adnexal mass.  Other: No abdominal wall hernia or abnormality. No abdominopelvic ascites.  Musculoskeletal: No acute or significant osseous findings.  IMPRESSION: 1. No acute abdominal or pelvic pathology. 2. Large cystic right ovarian mass measuring 8 x 9 x 9.9 cm. This may reflect a large simple cyst versus serous cystadenoma.  Recommend GYN consultation.   Electronically Signed   By: Kathreen Devoid   On: 02/23/2017 15:55  CLINICAL DATA:  Right pelvic pain for 1 day.  EXAM: TRANSABDOMINAL AND TRANSVAGINAL ULTRASOUND OF PELVIS  DOPPLER ULTRASOUND OF OVARIES  TECHNIQUE: Both transabdominal and transvaginal ultrasound examinations of the pelvis were performed. Transabdominal technique was performed for global imaging of the pelvis including uterus, ovaries, adnexal regions, and pelvic cul-de-sac.  It was necessary to proceed with endovaginal exam following the transabdominal exam to visualize the adnexa. Color and duplex Doppler ultrasound was utilized to evaluate blood flow to the ovaries.  COMPARISON:  12/22/2009  FINDINGS: Uterus  Measurements: 6.7 x 3.0 x 4.1 cm. No fibroids or other mass visualized.  Endometrium  Thickness: 8 mm.  No focal abnormality visualized.  Right ovary  Measurements: 10 x 7.1 x 9.8 cm. The enlargement of the right ovary is secondary to a large circumscribed hypoechoic to anechoic mass which measures 8.9 x 6.4 x 7.9 cm. Low level internal echoes are seen within the mass.  Left ovary  Measurements:  4.2 x 3.3 x 3.0 cm. Normal appearance/no adnexal mass.  Pulsed Doppler evaluation of both ovaries demonstrates normal low-resistance arterial and venous waveforms.  Other findings  Trace free fluid.  IMPRESSION: Normal appearance of the uterus and the left ovary.  The right ovary is enlarged secondary to an 8.9 cm circumscribed hypoechoic mass containing low level echoes. This may represent a hemorrhagic cyst, however if no surgical intervention is considered, follow-up ultrasound in 6-8 weeks is recommended to assure resolution or decrease of the size of this mass, as low grade ovarian malignancy cannot be entirely excluded.  Preserved blood flow to bilateral ovaries.   Electronically Signed   By: Fidela Salisbury M.D.   On:  02/23/2017 14:50  Assessment: Pt stable  Plan:  1. RO cyst Images reviewed and appears simple given anechoic fluid, thin walls (one thin septation), no hypervascularity and no LAD or free fluid seen in CT and u/s images. No e/o torsion. D/w her re: exp management and repeat u/s in a few weeks or surgical intervention. Given continued s/s and not getting, I told her that I recommend surgical intervention. Risks of surgery d/w her and possible removal of ovary and she is amenable to plan. Unfortunately, no OR time until Monday. I told her if s/s lessen in that time to let us know and can consider cancelling surgery. Patient posted for l/s right ovarian cystectomy. Percocet 5/325 #40 and toradol given.  Pap collected today  Torsion precautions given.    RTC after surgery.   Durene Romans MD Attending [facility] for [employer])

## 2017-02-26 NOTE — Telephone Encounter (Signed)
-----   Message from Aletha Halim, MD sent at 02/26/2017  3:09 PM EDT ----- Regarding: 5/8 laparoscopic right ovarian cystectomy. can you add on to tuesd afternoon . i'll need another MD to help (preferably) or an RNFA. Thanks!

## 2017-02-27 ENCOUNTER — Other Ambulatory Visit: Payer: Self-pay | Admitting: Obstetrics and Gynecology

## 2017-02-27 NOTE — Patient Instructions (Signed)
Your procedure is scheduled on:  Monday, Mar 03, 2017  Enter through the [employer] of [facility] at:  7:00 AM  Pick up the phone at the desk and dial [phone].  Call this number if you have problems the morning of surgery: [phone].  Remember: Do NOT eat food or drink after:  Midnight Sunday  Take these medicines the morning of surgery with a SIP OF WATER:  None  Stop ALL herbal medications at this time  Do NOT smoke the day of surgery.  Do NOT wear jewelry (body piercing), metal hair clips/bobby pins, make-up, or nail polish. Do NOT wear lotions, powders, or perfumes.  You may wear deodorant. Do NOT shave for 48 hours prior to surgery. Do NOT bring valuables to the hospital. Contacts, dentures, or bridgework may not be worn into surgery.  Have a responsible adult drive you home and stay with you for 24 hours after your procedure  Bring a copy of your healthcare power of attorney and living will documents.

## 2017-02-28 ENCOUNTER — Encounter (HOSPITAL_COMMUNITY): Payer: Self-pay

## 2017-02-28 ENCOUNTER — Encounter (HOSPITAL_COMMUNITY)
Admission: RE | Admit: 2017-02-28 | Discharge: 2017-02-28 | Disposition: A | Discharge status: Home / Self Care | Payer: 59 | Source: Ambulatory Visit | Attending: Obstetrics and Gynecology | Admitting: Obstetrics and Gynecology

## 2017-02-28 DIAGNOSIS — Z01812 Encounter for preprocedural laboratory examination: Secondary | ICD-10-CM | POA: Insufficient documentation

## 2017-02-28 LAB — CBC
HEMATOCRIT: 34.9 % — AB (ref 36.0–46.0)
Hemoglobin: 11.8 g/dL — ABNORMAL LOW (ref 12.0–15.0)
MCH: 27.6 pg (ref 26.0–34.0)
MCHC: 33.8 g/dL (ref 30.0–36.0)
MCV: 81.7 fL (ref 78.0–100.0)
Platelets: 345 10*3/uL (ref 150–400)
RBC: 4.27 MIL/uL (ref 3.87–5.11)
RDW: 14.5 % (ref 11.5–15.5)
WBC: 9.7 10*3/uL (ref 4.0–10.5)

## 2017-02-28 NOTE — Patient Instructions (Signed)
Your procedure is scheduled on:  Monday, Mar 03, 2017  Enter through the [employer] of [facility] at:  7:00 AM  Pick up the phone at the desk and dial [phone].  Call this number if you have problems the morning of surgery: [phone].  Remember: Do NOT eat food or drink after: Midnight Sunday  Take these medicines the morning of surgery with a SIP OF WATER:  None  Stop ALL herbal medications at this time  Do NOT smoke the day of surgery.  Do NOT wear jewelry (body piercing), metal hair clips/bobby pins, make-up, or nail polish. Do NOT wear lotions, powders, or perfumes.  You may wear deodorant. Do NOT shave for 48 hours prior to surgery. Do NOT bring valuables to the hospital. Contacts, dentures, or bridgework may not be worn into surgery.  Have a responsible adult drive you home and stay with you for 24 hours after your procedure  Bring a copy of your healthcare power of attorney and living will documents.

## 2017-03-02 ENCOUNTER — Telehealth: Payer: Self-pay | Admitting: Obstetrics and Gynecology

## 2017-03-02 DIAGNOSIS — N83201 Unspecified ovarian cyst, right side: Secondary | ICD-10-CM

## 2017-03-02 NOTE — Telephone Encounter (Signed)
GYN Telephone Note Patient called at [phone]. She states the pain is better. She is only using the toradol and no narcotics and she's able to do the normal things she normally does, unlike before, and she'd like to hold off on surgery. I told her as long as no fevers, chills, nausea, vomiting and pain that can be helped with OTCs and its minimal to moderate that we can repeat the u/s, which she is amenable to. OR called and surgery cancelled and message sent to clinic to schedule he for repeat u/s. LMP 4/23 so will aim for repeat u/s for the week of 6/24th. ER precautions given  Durene Romans MD Attending [facility] [place] ([facility]ulty [facility]) 03/02/2017 Time: [phone]

## 2017-03-03 ENCOUNTER — Ambulatory Visit (HOSPITAL_COMMUNITY): Admission: RE | Admit: 2017-03-03 | Payer: 59 | Source: Ambulatory Visit | Admitting: Obstetrics and Gynecology

## 2017-03-03 ENCOUNTER — Encounter (HOSPITAL_COMMUNITY): Admission: RE | Payer: Self-pay | Source: Ambulatory Visit

## 2017-03-03 SURGERY — EXCISION, CYST, OVARY, LAPAROSCOPIC
Anesthesia: Choice | Laterality: Right

## 2017-03-04 ENCOUNTER — Encounter: Payer: Self-pay | Admitting: Obstetrics and Gynecology

## 2017-03-04 ENCOUNTER — Telehealth: Payer: Self-pay

## 2017-03-04 DIAGNOSIS — N879 Dysplasia of cervix uteri, unspecified: Secondary | ICD-10-CM | POA: Insufficient documentation

## 2017-03-04 LAB — CYTOLOGY - PAP: HPV: DETECTED — AB

## 2017-03-04 NOTE — Telephone Encounter (Signed)
Patient has been informed of abnormal pap smear and the need to have a colposcopy. [place] desk staff will give her a call to scheduled appointment.

## 2017-03-04 NOTE — Telephone Encounter (Signed)
-----   Message from Aletha Halim, MD sent at 03/04/2017 12:26 PM EDT ----- Can you let her know that her pap smear was abnormal and she needs a colposcopy sometime in the next month? Thanks!

## 2017-03-12 ENCOUNTER — Other Ambulatory Visit: Payer: Self-pay | Admitting: *Deleted

## 2017-03-12 ENCOUNTER — Telehealth: Payer: Self-pay | Admitting: *Deleted

## 2017-03-12 NOTE — Telephone Encounter (Signed)
Per Dr. Ilda Basset need to change [place] to week of 6/25 and call patient to notify her. She may cancel follow up gyn scheduled for 6/6 if she is doing fine. Will be called results of [place].   I called Brandi Andrews and left a messge we are calling with some information- please call our office and ask to speak to a nurse.

## 2017-03-12 NOTE — Progress Notes (Signed)
Per message from Dr. Ilda Basset needs US transvag and pelvic to follow up right ovarian cyst for the week of June 24th .  No need for gyn appt- he will call her with results. Per chart review this is already scheduled.

## 2017-03-14 NOTE — Telephone Encounter (Signed)
I called Brandi Andrews to notify her of [place] date and no need to keep 6/6 appt unless having problems. She asked about the call she got about abnormal pap- states did not understand.  I explained to her she had abnormal pap and doctor recommends to do a colposcopy and after verifying with registrar will do the colposcopy at her already scheduled appt on 04/02/17. She voices understanding.

## 2017-03-20 ENCOUNTER — Ambulatory Visit (HOSPITAL_COMMUNITY): Payer: 59

## 2017-04-02 ENCOUNTER — Ambulatory Visit: Payer: Self-pay | Admitting: Obstetrics and Gynecology

## 2017-04-07 ENCOUNTER — Encounter (HOSPITAL_COMMUNITY): Payer: Self-pay | Admitting: Emergency Medicine

## 2017-04-07 ENCOUNTER — Ambulatory Visit (HOSPITAL_COMMUNITY)
Admission: EM | Admit: 2017-04-07 | Discharge: 2017-04-07 | Disposition: A | Discharge status: Home / Self Care | Payer: 59 | Attending: Internal Medicine | Admitting: Internal Medicine

## 2017-04-07 DIAGNOSIS — N898 Other specified noninflammatory disorders of vagina: Secondary | ICD-10-CM | POA: Insufficient documentation

## 2017-04-07 DIAGNOSIS — N760 Acute vaginitis: Secondary | ICD-10-CM

## 2017-04-07 NOTE — ED Provider Notes (Signed)
CSN: [id number]     Arrival date & time 04/07/17  1016 History   None    Chief Complaint  Patient presents with  . Vaginal Discharge   (Consider location/radiation/quality/duration/timing/severity/associated sxs/prior Treatment) Patient c/o vaginal discharge and discomfort for 3 days.  She has been using monistat w/o results.  She is sexually active.   The history is provided by the patient.  Vaginal Discharge  Quality:  White Severity:  Mild Onset quality:  Sudden Duration:  3 days Timing:  Constant Progression:  Worsening Chronicity:  New Context: after intercourse   Relieved by:  Nothing Worsened by:  Nothing Ineffective treatments:  None tried   Past Medical History:  Diagnosis Date  . Obesity    History reviewed. No pertinent surgical history. Family History  Problem Relation Age of Onset  . Diabetes Mother   . Breast cancer Mother 26   Social History  Substance Use Topics  . Smoking status: Current Every Day Smoker    Packs/day: 0.10    Years: 4.00    Types: Cigarettes  . Smokeless tobacco: Never Used  . Alcohol use Yes   OB History    Gravida Para Term Preterm AB Living   1       1 0   SAB TAB Ectopic Multiple Live Births   1       0      Obstetric Comments   G1: 2012 early SAB     Review of Systems  Constitutional: Negative.   HENT: Negative.   Eyes: Negative.   Respiratory: Negative.   Cardiovascular: Negative.   Gastrointestinal: Negative.   Endocrine: Negative.   Genitourinary: Positive for vaginal discharge.  Musculoskeletal: Negative.   Skin: Negative.   Allergic/Immunologic: Negative.   Neurological: Negative.   Hematological: Negative.   Psychiatric/Behavioral: Negative.     Allergies  Mushroom extract complex  Home Medications   Prior to Admission medications   Not on File   Meds Ordered and Administered this Visit  Medications - No data to display  BP 134/86 (BP Location: Right Arm)   Pulse 79   Temp 98.3 F (36.8  C) (Oral)   Resp 18   LMP 03/19/2017   SpO2 99%  No data found.   Physical Exam  Constitutional: She appears well-developed and well-nourished.  HENT:  Head: Normocephalic and atraumatic.  Eyes: Conjunctivae and EOM are normal. Pupils are equal, round, and reactive to light.  Neck: Normal range of motion. Neck supple.  Cardiovascular: Normal rate, regular rhythm and normal heart sounds.   Pulmonary/Chest: Effort normal and breath sounds normal.  Genitourinary: No vaginal discharge found.  Genitourinary Comments: Declines exam.  Nursing note and vitals reviewed.   Urgent Care Course     Procedures (including critical care time)  Labs Review Labs Reviewed  CERVICOVAGINAL ANCILLARY ONLY    Imaging Review No results found.   Visual Acuity Review  Right Eye Distance:   Left Eye Distance:   Bilateral Distance:    Right Eye Near:   Left Eye Near:    Bilateral Near:         MDM   1. Vaginal discharge   2. Acute vaginitis    Vaginal Cytology - GC/ chlamydia trich bvag cvag  Patient declines any empiric treatment of her vaginal discharge.    Lysbeth Penner, FNP 04/07/17 [place], [place], FNP 04/07/17 1141

## 2017-04-07 NOTE — ED Triage Notes (Signed)
The patient presented to the [facility] with a complaint of a vaginal discharge and discomfort x 3 days. The patient reported using an OTC Monistat treatment.

## 2017-04-08 LAB — CERVICOVAGINAL ANCILLARY ONLY
Bacterial vaginitis: POSITIVE — AB
Candida vaginitis: NEGATIVE
Chlamydia: NEGATIVE
Neisseria Gonorrhea: NEGATIVE
Trichomonas: NEGATIVE

## 2017-04-09 ENCOUNTER — Telehealth (HOSPITAL_COMMUNITY): Payer: Self-pay | Admitting: Internal Medicine

## 2017-04-09 MED ORDER — METRONIDAZOLE 500 MG PO TABS: 500.0000 mg | ORAL_TABLET | Freq: Two times a day (BID) | ORAL | 0 refills | Status: DC

## 2017-04-09 NOTE — Telephone Encounter (Signed)
Please let patient know that test for gardnerella (bacterial vaginosis) was positive.  Will send rx for metronidazole to pharmacy of record, [facility] on [address] at [address] [employer] [address].  Recheck for further evaluation if symptoms are not improving.   LM

## 2017-04-21 ENCOUNTER — Ambulatory Visit (HOSPITAL_COMMUNITY): Admission: RE | Admit: 2017-04-21 | Payer: 59 | Source: Ambulatory Visit

## 2017-05-07 ENCOUNTER — Ambulatory Visit: Payer: Self-pay | Admitting: Obstetrics and Gynecology

## 2017-05-07 ENCOUNTER — Telehealth: Payer: Self-pay

## 2017-05-07 ENCOUNTER — Encounter: Payer: Self-pay | Admitting: Obstetrics and Gynecology

## 2017-05-07 NOTE — Telephone Encounter (Signed)
LM for pt in regards to missed appt and to please call the office to reschedule.  Letter sent.

## 2017-06-10 ENCOUNTER — Encounter (HOSPITAL_COMMUNITY): Payer: Self-pay | Admitting: Nurse Practitioner

## 2017-06-10 ENCOUNTER — Ambulatory Visit (HOSPITAL_COMMUNITY)
Admission: EM | Admit: 2017-06-10 | Discharge: 2017-06-10 | Disposition: A | Discharge status: Home / Self Care | Payer: 59 | Attending: Family Medicine | Admitting: Family Medicine

## 2017-06-10 DIAGNOSIS — J209 Acute bronchitis, unspecified: Secondary | ICD-10-CM

## 2017-06-10 DIAGNOSIS — R059 Cough, unspecified: Secondary | ICD-10-CM

## 2017-06-10 DIAGNOSIS — R05 Cough: Secondary | ICD-10-CM

## 2017-06-10 MED ORDER — BENZONATATE 100 MG PO CAPS: 200.0000 mg | ORAL_CAPSULE | Freq: Three times a day (TID) | ORAL | 0 refills | Status: DC | PRN

## 2017-06-10 MED ORDER — ALBUTEROL SULFATE (2.5 MG/3ML) 0.083% IN NEBU
2.5000 mg | INHALATION_SOLUTION | Freq: Once | RESPIRATORY_TRACT | Status: AC
  Administered 2017-06-10: 2.5 mg via RESPIRATORY_TRACT

## 2017-06-10 MED ORDER — ALBUTEROL SULFATE (2.5 MG/3ML) 0.083% IN NEBU
INHALATION_SOLUTION | RESPIRATORY_TRACT | Status: AC
  Filled 2017-06-10: qty 3

## 2017-06-10 MED ORDER — AZITHROMYCIN 250 MG PO TABS: 250.0000 mg | ORAL_TABLET | Freq: Every day | ORAL | 0 refills | Status: DC

## 2017-06-10 MED ORDER — ALBUTEROL SULFATE HFA 108 (90 BASE) MCG/ACT IN AERS: 1.0000 | INHALATION_SPRAY | Freq: Four times a day (QID) | RESPIRATORY_TRACT | 0 refills | Status: DC | PRN

## 2017-06-10 MED ORDER — METHYLPREDNISOLONE 4 MG PO TBPK: ORAL_TABLET | ORAL | 0 refills | Status: DC

## 2017-06-10 NOTE — ED Triage Notes (Signed)
Pt was seen and discharged by Rush Landmark, NP, prior to triage.

## 2017-06-10 NOTE — ED Provider Notes (Signed)
  [place]   [id number] 06/10/17 Arrival Time: 3419  ASSESSMENT & PLAN:  1. Acute bronchitis, unspecified organism   2. Cough     Meds ordered this encounter  Medications  . albuterol (PROVENTIL) (2.5 MG/3ML) 0.083% nebulizer solution 2.5 mg  . azithromycin (ZITHROMAX) 250 MG tablet    Sig: Take 1 tablet (250 mg total) by mouth daily. Take first 2 tablets together, then 1 every day until finished.    Dispense:  6 tablet    Refill:  0    Order Specific Question:   Supervising Provider    Answer:   Sherlene Shams [[id number]]  . methylPREDNISolone (MEDROL DOSEPAK) 4 MG TBPK tablet    Sig: Take 6-5-4-3-2-1 po qd    Dispense:  21 tablet    Refill:  0    Order Specific Question:   Supervising Provider    Answer:   Sherlene Shams [[id number]]  . albuterol (PROVENTIL HFA;VENTOLIN HFA) 108 (90 Base) MCG/ACT inhaler    Sig: Inhale 1-2 puffs into the lungs every 6 (six) hours as needed for wheezing or shortness of breath.    Dispense:  1 Inhaler    Refill:  0    Order Specific Question:   Supervising Provider    Answer:   Sherlene Shams [[id number]]  . benzonatate (TESSALON) 100 MG capsule    Sig: Take 2 capsules (200 mg total) by mouth 3 (three) times daily as needed for cough.    Dispense:  30 capsule    Refill:  0    Order Specific Question:   Supervising Provider    Answer:   Sherlene Shams [[id number]]    Reviewed expectations re: course of current medical issues. Questions answered. Outlined signs and symptoms indicating need for more acute intervention. Patient verbalized understanding. After Visit Summary given.   SUBJECTIVE:  Brandi Andrews is a 26 y.o. female who presents with complaint of   ROS: As per HPI.   OBJECTIVE:  Vitals:   06/10/17 1650  BP: 134/69  Pulse: (!) 104  Resp: 16  Temp: 98.8 F (37.1 C)  TempSrc: Oral  SpO2: 100%     General appearance: alert; no distress HEENT: normocephalic; atraumatic; conjunctivae normal; TMs normal; nasal  mucosa normal; oral mucosa normal Neck: supple Lungs: clear to auscultation bilaterally Heart: regular rate and rhythm Abdomen: soft, non-tender; bowel sounds normal; no masses or organomegaly; no guarding or rebound tenderness Back: no CVA tenderness Extremities: no cyanosis or edema; symmetrical with no gross deformities Skin: warm and dry Neurologic: normal symmetric reflexes; normal gait Psychological:  alert and cooperative; normal mood and affect    Labs Reviewed - No data to display  No results found.  Allergies  Allergen Reactions  . Mushroom Extract Complex Swelling    Lips swell and get dizzy    PMHx, SurgHx, SocialHx, Medications, and Allergies were reviewed in the Visit Navigator and updated as appropriate.      Lysbeth Penner, [place] 06/10/17 1701

## 2017-06-16 ENCOUNTER — Ambulatory Visit (INDEPENDENT_AMBULATORY_CARE_PROVIDER_SITE_OTHER): Payer: 59 | Admitting: Obstetrics and Gynecology

## 2017-06-16 ENCOUNTER — Encounter: Payer: Self-pay | Admitting: Obstetrics and Gynecology

## 2017-06-16 ENCOUNTER — Other Ambulatory Visit (HOSPITAL_COMMUNITY)
Admission: RE | Admit: 2017-06-16 | Discharge: 2017-06-16 | Disposition: A | Discharge status: Home / Self Care | Payer: 59 | Source: Ambulatory Visit | Attending: Obstetrics and Gynecology | Admitting: Obstetrics and Gynecology

## 2017-06-16 VITALS — BP 120/77 | HR 84 | Ht 67.0 in | Wt 244.0 lb

## 2017-06-16 DIAGNOSIS — Z3202 Encounter for pregnancy test, result negative: Secondary | ICD-10-CM | POA: Diagnosis not present

## 2017-06-16 DIAGNOSIS — R87611 Atypical squamous cells cannot exclude high grade squamous intraepithelial lesion on cytologic smear of cervix (ASC-H): Secondary | ICD-10-CM | POA: Diagnosis not present

## 2017-06-16 DIAGNOSIS — N870 Mild cervical dysplasia: Secondary | ICD-10-CM | POA: Diagnosis not present

## 2017-06-16 LAB — POCT PREGNANCY, URINE: PREG TEST UR: NEGATIVE

## 2017-06-16 NOTE — Procedures (Signed)
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   The risks (including infection, bleeding, pain) and benefits of the procedure were explained to the patient and written informed consent was obtained.  The patient was placed in the dorsal lithotomy position. A Graves was speculum inserted in the vagina, and the cervix was visualized.  AA staining done Lugol's with green filter.  Biopsy from 10 o'clock was taken and then single toothed tenaculum applied and ECC in all four quadrants done. No bleeding after procedure  Findings: diffuse AWE changes with AA staining and slightly hypervascular area at 9-11 o'clock. Confirmatory with Lugol's  Adequate: Yes  Specimens: 10 o'clock and ECC  Condition: Stable  Complications: None  Plan: The patient was advised to call for any fever or for prolonged or severe pain or bleeding. She was advised to use OTC analgesics as needed for mild to moderate pain. She was advised to avoid vaginal intercourse for 48 hours or until the bleeding has completely stopped.   Durene Romans MD Attending Center for [employer] farm manager)

## 2017-06-26 ENCOUNTER — Telehealth: Payer: Self-pay | Admitting: *Deleted

## 2017-06-26 DIAGNOSIS — N83209 Unspecified ovarian cyst, unspecified side: Secondary | ICD-10-CM

## 2017-06-26 NOTE — Telephone Encounter (Signed)
Called patient and informed her of results. Ultrasound scheduled for 9/10 at 845.

## 2017-06-26 NOTE — Telephone Encounter (Signed)
-----   Message from Aletha Halim, MD sent at 06/20/2017  6:52 AM EDT ----- Please let her know that her colpo showed only low grade abnormal cells so she just needs to repeat her pap in one year. Also, please schedule her an early cycle about 7-10 after her period has started, pelvic u/s to follow up on her ovarian cyst. thanks

## 2017-07-03 ENCOUNTER — Ambulatory Visit (INDEPENDENT_AMBULATORY_CARE_PROVIDER_SITE_OTHER): Payer: 59

## 2017-07-03 ENCOUNTER — Encounter (HOSPITAL_COMMUNITY): Payer: Self-pay | Admitting: Nurse Practitioner

## 2017-07-03 ENCOUNTER — Ambulatory Visit (HOSPITAL_COMMUNITY)
Admission: EM | Admit: 2017-07-03 | Discharge: 2017-07-03 | Disposition: A | Discharge status: Home / Self Care | Payer: 59 | Attending: Internal Medicine | Admitting: Internal Medicine

## 2017-07-03 DIAGNOSIS — M25572 Pain in left ankle and joints of left foot: Secondary | ICD-10-CM

## 2017-07-03 DIAGNOSIS — S93492A Sprain of other ligament of left ankle, initial encounter: Secondary | ICD-10-CM | POA: Diagnosis not present

## 2017-07-03 DIAGNOSIS — S99922A Unspecified injury of left foot, initial encounter: Secondary | ICD-10-CM | POA: Diagnosis not present

## 2017-07-03 DIAGNOSIS — W19XXXA Unspecified fall, initial encounter: Secondary | ICD-10-CM | POA: Diagnosis not present

## 2017-07-03 DIAGNOSIS — S99912A Unspecified injury of left ankle, initial encounter: Secondary | ICD-10-CM | POA: Diagnosis not present

## 2017-07-03 MED ORDER — IBUPROFEN 800 MG PO TABS
800.0000 mg | ORAL_TABLET | Freq: Once | ORAL | Status: AC
  Administered 2017-07-03: 800 mg via ORAL

## 2017-07-03 MED ORDER — IBUPROFEN 800 MG PO TABS
ORAL_TABLET | ORAL | Status: AC
  Filled 2017-07-03: qty 1

## 2017-07-03 MED ORDER — MELOXICAM 15 MG PO TABS: 15.0000 mg | ORAL_TABLET | Freq: Every day | ORAL | 0 refills | Status: DC

## 2017-07-03 NOTE — ED Triage Notes (Signed)
Pt presents with c/o left ankle pain. The pain began when she twisted her ankle yesterday. she c/o pain and swelling since. Her symptoms worsen with weight bearing. Her symptoms improve with rest. she's tried ice, elevation, percocet with some relief

## 2017-07-03 NOTE — ED Provider Notes (Signed)
[place]   [id number] 07/03/17 Arrival Time: 1006   SUBJECTIVE:  Brandi Andrews is a 26 y.o. female who presents to the urgent care with complaint of left ankle pain. States she fell last night going down stairs, and "twisted" her ankle. She did not hit her head, no loss of consciousness, she remembers the events. She was able to bear weight immediately after falling, but swelling and pain has worsened to the point where she is no longer able to bear weight. She has tried ice, elevation, and Percocets, and this has given her some relief.     Past Medical History:  Diagnosis Date  . Obesity    Family History  Problem Relation Age of Onset  . Diabetes Mother   . Breast cancer Mother 46   Social History   Social History  . Marital status: Single    Spouse name: N/A  . Number of children: N/A  . Years of education: N/A   Occupational History  . Not on file.   Social History Main Topics  . Smoking status: Former Smoker    Packs/day: 0.10    Years: 4.00    Types: Cigarettes  . Smokeless tobacco: Never Used  . Alcohol use Yes  . Drug use: No  . Sexual activity: Not on file   Other Topics Concern  . Not on file   Social History Narrative  . No narrative on file   No outpatient prescriptions have been marked as taking for the 07/03/17 encounter [facility] Encounter).   Allergies  Allergen Reactions  . Mushroom Extract Complex Swelling    Lips swell and get dizzy  . Penicillins       ROS: As per HPI, remainder of ROS negative.   OBJECTIVE:   Vitals:   07/03/17 1042  BP: 107/85  Pulse: 82  Resp: 16  Temp: 98.4 F (36.9 C)  TempSrc: Oral  SpO2: 100%    General appearance: alert; no distress Eyes: conjunctiva normal HENT: normocephalic; atraumatic; Back: no CVA tenderness, Or tenderness along the midline Extremities: no cyanosis or edema; symmetrical with no gross deformities, left ankle notably swollen, pulses remain intact with capillary  refill less than 2 seconds, dorsalis pedis and posterior tibial +2, there is tenderness along the fifth metatarsal, and lateral malleolus pain with flexion and rotation Skin: warm and dry Neurologic: normal gait; grossly normal Psychological: alert and cooperative; normal mood and affect      Labs:  Labs Reviewed - No data to display  Dg Ankle Complete Left  Result Date: 07/03/2017 CLINICAL DATA:  fell down the last two steps of her apartment. Injury is to the left foot and ankle. Patient pointed to the left ankle, posterior and distal to the lateral malleolus. No prior injury to the left ankle. Patient is unable to bear weight onto the left foot and ankle. Patient is not a diabetic EXAM: LEFT ANKLE COMPLETE - 3+ VIEW COMPARISON:  None. FINDINGS: There is no evidence of fracture, dislocation, or joint effusion. There is no evidence of arthropathy or other focal bone abnormality. Soft tissues are unremarkable. IMPRESSION: Negative. Electronically Signed   By: Lucrezia Europe M.D.   On: 07/03/2017 11:11   Dg Foot Complete Left  Result Date: 07/03/2017 CLINICAL DATA:  Less post fall down 2 steps injuring the left foot and left ankle laterally. The patient is unable to bear weight. EXAM: LEFT FOOT - COMPLETE 3+ VIEW COMPARISON:  Left ankle series of today's date FINDINGS: The bones  of the left foot are subjectively adequately mineralized. There is no acute fracture nor dislocation. There is mild soft tissue swelling over the dorsum of the midfoot. Specific attention to the metatarsal bases reveals no acute abnormality. IMPRESSION: There is no acute fracture nor dislocation of the bones of the left foot. Electronically Signed   By: David  Martinique M.D.   On: 07/03/2017 11:15       ASSESSMENT & PLAN:  1. Sprain of anterior talofibular ligament of left ankle, initial encounter     Meds ordered this encounter  Medications  . ibuprofen (ADVIL,MOTRIN) tablet 800 mg  . meloxicam (MOBIC) 15 MG tablet     Sig: Take 1 tablet (15 mg total) by mouth daily.    Dispense:  30 tablet    Refill:  0    Order Specific Question:   Supervising Provider    Answer:   Sherlene Shams [[id number]]   RICE therapy, crutches, follow up with ortho Reviewed expectations re: course of current medical issues. Questions answered. Outlined signs and symptoms indicating need for more acute intervention. Patient verbalized understanding. After Visit Summary given.    Procedures:        Barnet Glasgow, NP 07/03/17 1131

## 2017-07-03 NOTE — Discharge Instructions (Signed)
You have a sprained ankle. We have applied an ASO to your ankle and you have been given crutches. I have prescribed a medicine called Meloxicam for pain. Take 1 tablet every day. You may take Tylenol every 4-6 hours for additional pain control, not to exceed 4,000 mg a day of this medicine. I recommend rest, ice, compression through the use of an ASO or Ace bandages, and elevate your ankle as much as possible. Should your pain persist or fail to resolve, follow up with an orthopedist or your primary care provider.

## 2017-07-07 ENCOUNTER — Encounter (HOSPITAL_COMMUNITY): Payer: Self-pay | Admitting: Emergency Medicine

## 2017-07-07 ENCOUNTER — Ambulatory Visit (HOSPITAL_COMMUNITY)
Admission: EM | Admit: 2017-07-07 | Discharge: 2017-07-07 | Disposition: A | Discharge status: Home / Self Care | Payer: 59 | Source: Home / Self Care | Attending: Family Medicine | Admitting: Family Medicine

## 2017-07-07 ENCOUNTER — Ambulatory Visit (HOSPITAL_COMMUNITY)
Admission: RE | Admit: 2017-07-07 | Discharge: 2017-07-07 | Disposition: A | Discharge status: Home / Self Care | Payer: 59 | Source: Ambulatory Visit | Attending: Obstetrics and Gynecology | Admitting: Obstetrics and Gynecology

## 2017-07-07 ENCOUNTER — Emergency Department (HOSPITAL_COMMUNITY)
Admission: EM | Admit: 2017-07-07 | Discharge: 2017-07-07 | Disposition: A | Discharge status: Home / Self Care | Payer: 59 | Attending: Emergency Medicine | Admitting: Emergency Medicine

## 2017-07-07 ENCOUNTER — Emergency Department (HOSPITAL_COMMUNITY): Payer: 59

## 2017-07-07 DIAGNOSIS — N83291 Other ovarian cyst, right side: Secondary | ICD-10-CM | POA: Insufficient documentation

## 2017-07-07 DIAGNOSIS — Z87891 Personal history of nicotine dependence: Secondary | ICD-10-CM | POA: Diagnosis not present

## 2017-07-07 DIAGNOSIS — N83201 Unspecified ovarian cyst, right side: Secondary | ICD-10-CM | POA: Diagnosis not present

## 2017-07-07 DIAGNOSIS — R1031 Right lower quadrant pain: Secondary | ICD-10-CM | POA: Diagnosis not present

## 2017-07-07 DIAGNOSIS — R109 Unspecified abdominal pain: Secondary | ICD-10-CM | POA: Diagnosis not present

## 2017-07-07 DIAGNOSIS — D252 Subserosal leiomyoma of uterus: Secondary | ICD-10-CM

## 2017-07-07 HISTORY — DX: Unspecified ovarian cyst, unspecified side: N83.209

## 2017-07-07 LAB — COMPREHENSIVE METABOLIC PANEL
ALT: 14 U/L (ref 14–54)
AST: 18 U/L (ref 15–41)
Albumin: 4.1 g/dL (ref 3.5–5.0)
Alkaline Phosphatase: 56 U/L (ref 38–126)
Anion gap: 10 (ref 5–15)
BUN: <5 mg/dL — ABNORMAL LOW (ref 6–20)
CO2: 23 mmol/L (ref 22–32)
Calcium: 9 mg/dL (ref 8.9–10.3)
Chloride: 104 mmol/L (ref 101–111)
Creatinine, Ser: 0.86 mg/dL (ref 0.44–1.00)
GFR calc Af Amer: >60 mL/min (ref >60)
GFR calc non Af Amer: >60 mL/min (ref >60)
Glucose, Bld: 91 mg/dL (ref 65–99)
Potassium: 3.3 mmol/L — ABNORMAL LOW (ref 3.5–5.1)
Sodium: 137 mmol/L (ref 135–145)
Total Bilirubin: 0.7 mg/dL (ref 0.3–1.2)
Total Protein: 7.8 g/dL (ref 6.5–8.1)

## 2017-07-07 LAB — URINALYSIS, ROUTINE W REFLEX MICROSCOPIC
Bilirubin Urine: NEGATIVE
Glucose, UA: NEGATIVE mg/dL
Ketones, ur: NEGATIVE mg/dL
Nitrite: NEGATIVE
Protein, ur: NEGATIVE mg/dL
Specific Gravity, Urine: 1.013 (ref 1.005–1.030)
pH: 6 (ref 5.0–8.0)

## 2017-07-07 LAB — CBC
HCT: 35.5 % — ABNORMAL LOW (ref 36.0–46.0)
Hemoglobin: 11.9 g/dL — ABNORMAL LOW (ref 12.0–15.0)
MCH: 27.8 pg (ref 26.0–34.0)
MCHC: 33.5 g/dL (ref 30.0–36.0)
MCV: 82.9 fL (ref 78.0–100.0)
Platelets: 357 10*3/uL (ref 150–400)
RBC: 4.28 MIL/uL (ref 3.87–5.11)
RDW: 14.2 % (ref 11.5–15.5)
WBC: 8.9 10*3/uL (ref 4.0–10.5)

## 2017-07-07 LAB — LIPASE, BLOOD: Lipase: 29 U/L (ref 11–51)

## 2017-07-07 LAB — POC URINE PREG, ED: Preg Test, Ur: NEGATIVE

## 2017-07-07 MED ORDER — KETOROLAC TROMETHAMINE 15 MG/ML IJ SOLN
15.0000 mg | Freq: Once | INTRAMUSCULAR | Status: AC
  Administered 2017-07-07: 15 mg via INTRAVENOUS
  Filled 2017-07-07: qty 1

## 2017-07-07 MED ORDER — IOPAMIDOL (ISOVUE-300) INJECTION 61%
INTRAVENOUS | Status: AC
  Administered 2017-07-07: 75 mL via INTRAVENOUS
  Filled 2017-07-07: qty 100

## 2017-07-07 MED ORDER — OXYCODONE-ACETAMINOPHEN 5-325 MG PO TABS: 1.0000 | ORAL_TABLET | ORAL | 0 refills | Status: DC | PRN

## 2017-07-07 MED ORDER — SODIUM CHLORIDE 0.9 % IV BOLUS (SEPSIS)
1000.0000 mL | Freq: Once | INTRAVENOUS | Status: AC
  Administered 2017-07-07: 1000 mL via INTRAVENOUS

## 2017-07-07 MED ORDER — HYDROMORPHONE HCL 1 MG/ML IJ SOLN
1.0000 mg | Freq: Once | INTRAMUSCULAR | Status: AC
  Administered 2017-07-07: 1 mg via INTRAVENOUS
  Filled 2017-07-07: qty 1

## 2017-07-07 NOTE — ED Provider Notes (Signed)
  [place]   [id number] 07/07/17 Arrival Time: 1034   SUBJECTIVE:  Brandi Andrews is a 26 y.o. female who presents to the urgent care with complaint of right lower abdominal pain that is been ongoing for 3 days. Has had nausea, vomiting, has not had anything to eat due to lack of appetite, states she thought this was constipation, and did enemas, but had no relief in her symptoms. Last menstrual period was 06/27/2017. States she has both her gallbladder, and appendix, has had no prior abdominal surgeries. No fever or chills.   Past Medical History:  Diagnosis Date  . Obesity   . Ovarian cyst    Family History  Problem Relation Age of Onset  . Diabetes Mother   . Breast cancer Mother 51   Social History   Social History  . Marital status: Single    Spouse name: N/A  . Number of children: N/A  . Years of education: N/A   Occupational History  . Not on file.   Social History Main Topics  . Smoking status: Former Smoker    Packs/day: 0.10    Years: 4.00    Types: Cigarettes  . Smokeless tobacco: Never Used  . Alcohol use Yes  . Drug use: No  . Sexual activity: Not on file   Other Topics Concern  . Not on file   Social History Narrative  . No narrative on file   No outpatient prescriptions have been marked as taking for the 07/07/17 encounter [facility] Encounter).   Allergies  Allergen Reactions  . Mushroom Extract Complex Swelling    Lips swell and get dizzy  . Penicillins       ROS: As per HPI, remainder of ROS negative.   OBJECTIVE:   Vitals:   07/07/17 1149 07/07/17 1150  BP:  122/83  Pulse:  84  Resp:  16  Temp:  98.8 F (37.1 C)  TempSrc:  Oral  SpO2:  98%  Weight: 244 lb (110.7 kg)   Height: 5\' 7"  (1.702 m)      General appearance: alert; no distressIs observed sitting on the exam table holding her stomach and appearing in pain Eyes: PERRL; EOMI; conjunctiva normal HENT: normocephalic; atraumatic;  Neck: supple, no JVD  noted Lungs: clear to auscultation bilaterally Heart: regular rate and rhythm Abdomen: Bowel sounds are present, there is tenderness right lower quadrant, pain is relieved with pressure, and worsened with the release of pressure, positive rebound tenderness, negative psoas, negative Murphy  Back: no CVA tenderness Extremities: no cyanosis or edema; symmetrical with no gross deformities Skin: warm and dry Neurologic: normal gait; grossly normal Psychological: alert and cooperative; normal mood and affect      Labs:  Results for orders placed or performed in visit on 06/16/17  Pregnancy, urine POC  Result Value Ref Range   Preg Test, Ur NEGATIVE NEGATIVE    Labs Reviewed - No data to display   ASSESSMENT & PLAN:  1. Right lower quadrant abdominal pain    Based on signs, symptoms, physical exam findings, recommend going to the emergency room for further treatment and evaluation Reviewed expectations re: course of current medical issues. Questions answered. Outlined signs and symptoms indicating need for more acute intervention. Patient verbalized understanding. After Visit Summary given.    Procedures:        Barnet Glasgow, NP 07/07/17 1606

## 2017-07-07 NOTE — Discharge Instructions (Signed)
Based on signs, symptoms, and physical exam findings, I recommend you go to the emergency room for further evaluation of your condition.

## 2017-07-07 NOTE — Discharge Instructions (Signed)
The pain you are having is from a large R ovarian cyst .You need to follow-up with your gynecologist. Take prescribed pain medication as needed as well as ibuprofen 600mg  every 6 hours as needed.

## 2017-07-07 NOTE — ED Notes (Signed)
Patient transported to CT 

## 2017-07-07 NOTE — ED Triage Notes (Signed)
Pt states RLQ abdominal since Friday. Pt self diagnosed as constipation. Pt reports able to have BM Sat but pain has been unrelieved. Denies recent fever, diarrhea. Pt reports vomiting. LMP Aug 31. REports known right ovarian cyst. Seen at [facility] today and sent for further eval.

## 2017-07-07 NOTE — ED Triage Notes (Signed)
PT reports right lower quadrant abdominal pain with vomiting since Friday. PT reports she has not eaten anything since onset. PT reports she is not able to lay flat. PT thought this was due to constipation and tried an enema with little result.

## 2017-07-11 ENCOUNTER — Telehealth: Payer: Self-pay | Admitting: Obstetrics and Gynecology

## 2017-07-11 NOTE — Telephone Encounter (Signed)
GYN Telephone Note  Patient called at [phone] and it immediately went to a generic VM. Message left asking number to call clinic back for results. Will try again next week and if no success, send letter  Durene Romans MD [facility]ending [facility] (Faculty Practice) 07/11/2017 Time: 0354

## 2017-07-14 ENCOUNTER — Telehealth: Payer: Self-pay

## 2017-07-14 NOTE — Telephone Encounter (Signed)
Pt called requesting test results. 

## 2017-07-16 ENCOUNTER — Telehealth: Payer: Self-pay | Admitting: Obstetrics and Gynecology

## 2017-07-16 NOTE — Telephone Encounter (Signed)
Called pt and Dr. Ilda Basset requested that he will contact the pt during lunch hours to please stand by her phone during that time.  Pt stated that she will be available.  Message routed to Dr. Ilda Basset for results as requested.

## 2017-07-16 NOTE — Telephone Encounter (Signed)
GYN Telephone Note  Patient called at [phone] and it rang like normal and went to voicemail and it stated that it was full and couldn't leave messages. Will try again later  Durene Romans MD [facility]ending [facility] (Faculty Practice) 07/16/2017 Time: 1238pm

## 2017-07-17 NOTE — Telephone Encounter (Signed)
Shakeema left a message on voicemail 07/16/17 pm stating she missed Dr. Saverio Danker call.

## 2017-07-25 NOTE — ED Provider Notes (Signed)
[place] DEPT Provider Note   CSN: [id number] Arrival date & time: 07/07/17  1224     History   Chief Complaint Chief Complaint  Patient presents with  . Abdominal Pain    HPI Brandi Andrews is a 26 y.o. female.  HPI   26 year old female with abdominal pain. Gradual onset 3 days ago. Persistent since then. Pain is suprapubic right lower quadrant. No appreciable exacerbating relieving factors. Initially thought she may be constipated she has had subsequent bowel movements without improvement. No urinary complaints. No unusual vaginal bleeding or discharge. She is seen in urgent care prior to arrival.   Past Medical History:  Diagnosis Date  . Obesity   . Ovarian cyst     Patient Active Problem List   Diagnosis Date Noted  . Atypical squamous cells cannot exclude high grade squamous intraepithelial lesion on cytologic smear of cervix (ASC-H) 03/04/2017  . Right ovarian cyst 02/26/2017  . BMI 40.0-44.9, adult ([place]) 02/26/2017  . RLQ abdominal pain 02/26/2017    No past surgical history on file.  OB History    Gravida Para Term Preterm AB Living   1       1 0   SAB TAB Ectopic Multiple Live Births   1       0      Obstetric Comments   G1: 2012 early SAB       Home Medications    Prior to Admission medications   Medication Sig Start Date End Date Taking? Authorizing Provider  albuterol (PROVENTIL HFA;VENTOLIN HFA) 108 (90 Base) MCG/ACT inhaler Inhale 1-2 puffs into the lungs every 6 (six) hours as needed for wheezing or shortness of breath. 06/10/17   Lysbeth Penner, FNP  meloxicam (MOBIC) 15 MG tablet Take 1 tablet (15 mg total) by mouth daily. 07/03/17   Barnet Glasgow, NP  oxyCODONE-acetaminophen (PERCOCET/ROXICET) 5-325 MG tablet Take 1-2 tablets by mouth every 4 (four) hours as needed for severe pain. 07/07/17   Virgel Manifold, MD    Family History Family History  Problem Relation Age of Onset  . Diabetes Mother   . Breast cancer Mother 72     Social History Social History  Substance Use Topics  . Smoking status: Former Smoker    Packs/day: 0.10    Years: 4.00    Types: Cigarettes  . Smokeless tobacco: Never Used  . Alcohol use Yes     Allergies   Mushroom extract complex and Penicillins   Review of Systems Review of Systems  All systems reviewed and negative, other than as noted in HPI.  Physical Exam Updated Vital Signs BP 140/84 (BP Location: Right Arm)   Pulse 65   Temp 98.7 F (37.1 C) (Oral)   Resp 16   LMP 06/27/2017 (Exact Date)   SpO2 100%   Physical Exam  Constitutional: She appears well-developed and well-nourished. No distress.  HENT:  Head: Normocephalic and atraumatic.  Eyes: Conjunctivae are normal. Right eye exhibits no discharge. Left eye exhibits no discharge.  Neck: Neck supple.  Cardiovascular: Normal rate, regular rhythm and normal heart sounds.  Exam reveals no gallop and no friction rub.   No murmur heard. Pulmonary/Chest: Effort normal and breath sounds normal. No respiratory distress.  Abdominal: Soft. She exhibits no distension. There is tenderness.  rlq tenderness  Musculoskeletal: She exhibits no edema or tenderness.  Neurological: She is alert.  Skin: Skin is warm and dry.  Psychiatric: She has a normal mood and affect. Her behavior is normal.  Thought content normal.  Nursing note and vitals reviewed.    ED Treatments / Results  Labs (all labs ordered are listed, but only abnormal results are displayed) Labs Reviewed  COMPREHENSIVE METABOLIC PANEL - Abnormal; Notable for the following:       Result Value   Potassium 3.3 (*)    BUN <5 (*)    All other components within normal limits  CBC - Abnormal; Notable for the following:    Hemoglobin 11.9 (*)    HCT 35.5 (*)    All other components within normal limits  URINALYSIS, ROUTINE W REFLEX MICROSCOPIC - Abnormal; Notable for the following:    APPearance HAZY (*)    Hgb urine dipstick SMALL (*)    Leukocytes,  UA LARGE (*)    Bacteria, UA FEW (*)    Squamous Epithelial / LPF 6-30 (*)    All other components within normal limits  LIPASE, BLOOD  POC URINE PREG, ED    EKG  EKG Interpretation None       Radiology No results found.   US Transvaginal Non-ob  Result Date: 07/07/2017 CLINICAL DATA:  RIGHT ovarian cyst, followup, RIGHT lower quadrant pain and nausea for 5 months EXAM: TRANSABDOMINAL AND TRANSVAGINAL ULTRASOUND OF PELVIS TECHNIQUE: Both transabdominal and transvaginal ultrasound examinations of the pelvis were performed. Transabdominal technique was performed for global imaging of the pelvis including uterus, ovaries, adnexal regions, and pelvic cul-de-sac. It was necessary to proceed with endovaginal exam following the transabdominal exam to visualize the uterus, endometrium and LEFT ovary. COMPARISON:  CT abdomen and pelvis 02/23/2017, pelvic ultrasound 02/23/2017 FINDINGS: Uterus Measurements: 6.7 x 3.7 x 5.0 cm. Small anterior wall subserosal uterine leiomyoma near fundus 12 x 8 x 10 mm. No additional uterine masses Endometrium Thickness: 9 mm thick, normal. No endometrial fluid or focal abnormality Right ovary Measurements: 7.9 x 6.7 x 7.9 cm. Mildly complicated cyst within RIGHT ovary 8.0 x 7.2 x 7.2 cm, previously 8.9 x 6.4 x 7.9 cm. Scattered low level internal echoes and a small thin partial septation. No mural nodule. Left ovary Measurements: 3.9 x 3.2 x 3.5 cm. Small corpus luteal cyst. Otherwise normal appearance. Other findings Moderate simple appearing free pelvic fluid. No additional adnexal masses. IMPRESSION: Slight interval decrease in size of a mildly complicated RIGHT ovarian cyst which contains scattered low level internal echoes and a small thin partial septation. Since these may be difficult to assess completely with [place], further evaluation of simple-appearing cysts >7 cm with MRI or surgical evaluation is recommended according to the Society of Radiologists in Ultrasound  2010 Consensus Conference Statement (D Clovis Riley et al. Management of Asymptomatic Ovarian and other Adnexal Cysts Imaged at [place]: Society of Radiologists in [place] Statement 2010. Radiology 256 (Sept 2010): 440-102.). Small subserosal uterine leiomyoma 12 mm greatest diameter. Electronically Signed   By: Lavonia Dana M.D.   On: 07/07/2017 10:13   US Pelvis Complete  Result Date: 07/07/2017 CLINICAL DATA:  RIGHT ovarian cyst, followup, RIGHT lower quadrant pain and nausea for 5 months EXAM: TRANSABDOMINAL AND TRANSVAGINAL ULTRASOUND OF PELVIS TECHNIQUE: Both transabdominal and transvaginal ultrasound examinations of the pelvis were performed. Transabdominal technique was performed for global imaging of the pelvis including uterus, ovaries, adnexal regions, and pelvic cul-de-sac. It was necessary to proceed with endovaginal exam following the transabdominal exam to visualize the uterus, endometrium and LEFT ovary. COMPARISON:  CT abdomen and pelvis 02/23/2017, pelvic ultrasound 02/23/2017 FINDINGS: Uterus Measurements: 6.7 x 3.7 x 5.0 cm. Small  anterior wall subserosal uterine leiomyoma near fundus 12 x 8 x 10 mm. No additional uterine masses Endometrium Thickness: 9 mm thick, normal. No endometrial fluid or focal abnormality Right ovary Measurements: 7.9 x 6.7 x 7.9 cm. Mildly complicated cyst within RIGHT ovary 8.0 x 7.2 x 7.2 cm, previously 8.9 x 6.4 x 7.9 cm. Scattered low level internal echoes and a small thin partial septation. No mural nodule. Left ovary Measurements: 3.9 x 3.2 x 3.5 cm. Small corpus luteal cyst. Otherwise normal appearance. Other findings Moderate simple appearing free pelvic fluid. No additional adnexal masses. IMPRESSION: Slight interval decrease in size of a mildly complicated RIGHT ovarian cyst which contains scattered low level internal echoes and a small thin partial septation. Since these may be difficult to assess completely with [place], further evaluation of  simple-appearing cysts >7 cm with MRI or surgical evaluation is recommended according to the Society of Radiologists in Ultrasound 2010 Consensus Conference Statement (D Clovis Riley et al. Management of Asymptomatic Ovarian and other Adnexal Cysts Imaged at [place]: Society of Radiologists in [place] Statement 2010. Radiology 256 (Sept 2010): 102-725.). Small subserosal uterine leiomyoma 12 mm greatest diameter. Electronically Signed   By: Lavonia Dana M.D.   On: 07/07/2017 10:13   Ct Abdomen Pelvis W Contrast  Result Date: 07/07/2017 CLINICAL DATA:  Right mid abdominal pain since Friday. Nausea with lack of appetite. EXAM: CT ABDOMEN AND PELVIS WITH CONTRAST TECHNIQUE: Multidetector CT imaging of the abdomen and pelvis was performed using the standard protocol following bolus administration of intravenous contrast. CONTRAST:  39mL ISOVUE-300 IOPAMIDOL (ISOVUE-300) INJECTION 61% COMPARISON:  02/23/2017 FINDINGS: Lower chest:  Unremarkable. Hepatobiliary: No focal abnormality within the liver parenchyma. There is no evidence for gallstones, gallbladder wall thickening, or pericholecystic fluid. No intrahepatic or extrahepatic biliary dilation. Pancreas: No focal mass lesion. No dilatation of the main duct. No intraparenchymal cyst. No peripancreatic edema. Spleen: No splenomegaly. No focal mass lesion. Adrenals/Urinary Tract: No adrenal nodule or mass. Kidneys are unremarkable. No adrenal nodule or mass. No evidence for hydroureter. The urinary bladder appears normal for the degree of distention. Stomach/Bowel: Stomach is nondistended. No gastric wall thickening. No evidence of outlet obstruction. Duodenum is normally positioned as is the ligament of Treitz. No small bowel wall thickening. No small bowel dilatation. The terminal ileum is normal. The appendix is normal. No gross colonic mass. No colonic wall thickening. No substantial diverticular change. Vascular/Lymphatic: No abdominal aortic  aneurysm. No abdominal aortic atherosclerotic calcification. There is no gastrohepatic or hepatoduodenal ligament lymphadenopathy. No intraperitoneal or retroperitoneal lymphadenopathy. No pelvic sidewall lymphadenopathy. Reproductive: The uterus has normal CT imaging appearance. 7.1 x 8.3 cm cystic lesion is identified above the bladder and anterior to the uterus. This has some left-sided wall thickening (image 70 series 3). Left ovary appears to be just posterior to this lesion and right ovary is not clearly identified suggesting that it may be right ovarian in origin. Other: Small volume intraperitoneal free fluid. Musculoskeletal: Bone windows reveal no worrisome lytic or sclerotic osseous lesions. IMPRESSION: 1. 8.3 cm cystic lesion identified in the anterior pelvis, compatible with right ovarian origin. Lesion has an associated septation, better characterized on pelvic ultrasound performed earlier today. Please see that report for recommended follow-up including MRI without and with contrast or gyn consultation. 2. Small volume intraperitoneal free fluid. 3. Otherwise unremarkable CT scan of the abdomen and pelvis. Electronically Signed   By: Misty Stanley M.D.   On: 07/07/2017 16:51   Procedures Procedures (including critical  care time)  Medications Ordered in ED Medications  sodium chloride 0.9 % bolus 1,000 mL (0 mLs Intravenous Stopped 07/07/17 1723)  HYDROmorphone (DILAUDID) injection 1 mg (1 mg Intravenous Given 07/07/17 1459)  ketorolac (TORADOL) 15 MG/ML injection 15 mg (15 mg Intravenous Given 07/07/17 1459)  iopamidol (ISOVUE-300) 61 % injection (75 mLs Intravenous Contrast Given 07/07/17 1631)     Initial Impression / Assessment and Plan / ED Course  I have reviewed the triage vital signs and the nursing notes.  Pertinent labs & imaging results that were available during my care of the patient were reviewed by me and considered in my medical decision making (see chart for details).       26 year old female with right lower quadrant pain. Very large right ovarian cyst. Good flow by Doppler. No other acute findings. PRN pain medication. Return precautions discussed. GYN follow-up otherwise.  Final Clinical Impressions(s) / ED Diagnoses   Final diagnoses:  Right ovarian cyst    New Prescriptions Discharge Medication List as of 07/07/2017  5:31 PM    START taking these medications   Details  oxyCODONE-acetaminophen (PERCOCET/ROXICET) 5-325 MG tablet Take 1-2 tablets by mouth every 4 (four) hours as needed for severe pain., Starting Mon 07/07/2017, Print         Virgel Manifold, MD 07/25/17 1215

## 2017-07-29 ENCOUNTER — Telehealth: Payer: Self-pay | Admitting: Obstetrics and Gynecology

## 2017-07-29 NOTE — Telephone Encounter (Signed)
GYN Telephone Note  Patient called at [phone] and it rang like normal and went to voicemail. VM left to call the main clinic. Will send inbasket message to staff to try and call again and make a follow up appointment with me or send a letter asking the patient to call for a follow up appointment.   Durene Romans MD Attending Center for [facility] (Faculty Practice) 07/29/2017 Time: 1144am

## 2017-07-29 NOTE — Telephone Encounter (Signed)
GYN Telephone Note  Patient called the office at lunch. I Patient called at [phone] and it rang like normal and went to voicemail. I'm not in the office here in [place]; will have [place] office contact pt  Durene Romans MD Attending [facility] for [facility] (Faculty Practice) 07/29/2017 Time: [phone]

## 2017-08-14 ENCOUNTER — Emergency Department (HOSPITAL_COMMUNITY)
Admission: EM | Admit: 2017-08-14 | Discharge: 2017-08-14 | Disposition: A | Discharge status: Home / Self Care | Payer: Self-pay | Attending: Emergency Medicine | Admitting: Emergency Medicine

## 2017-08-14 ENCOUNTER — Emergency Department (HOSPITAL_COMMUNITY): Payer: Self-pay

## 2017-08-14 ENCOUNTER — Encounter (HOSPITAL_COMMUNITY): Payer: Self-pay | Admitting: Emergency Medicine

## 2017-08-14 DIAGNOSIS — B9789 Other viral agents as the cause of diseases classified elsewhere: Secondary | ICD-10-CM | POA: Insufficient documentation

## 2017-08-14 DIAGNOSIS — J069 Acute upper respiratory infection, unspecified: Secondary | ICD-10-CM | POA: Insufficient documentation

## 2017-08-14 DIAGNOSIS — Z87891 Personal history of nicotine dependence: Secondary | ICD-10-CM | POA: Insufficient documentation

## 2017-08-14 MED ORDER — ALBUTEROL SULFATE HFA 108 (90 BASE) MCG/ACT IN AERS
2.0000 | INHALATION_SPRAY | RESPIRATORY_TRACT | Status: DC | PRN
  Administered 2017-08-14: 2 via RESPIRATORY_TRACT
  Filled 2017-08-14: qty 6.7

## 2017-08-14 MED ORDER — GUAIFENESIN-DM 100-10 MG/5ML PO SYRP: 5.0000 mL | ORAL_SOLUTION | ORAL | 0 refills | Status: DC | PRN

## 2017-08-14 NOTE — ED Notes (Signed)
Declined W/C at D/C and was escorted to lobby by RN. 

## 2017-08-14 NOTE — ED Triage Notes (Signed)
Pt c/o cough productive for yellow sputum-- body aches, no fever, but states had a fever 2 days ago-- body aches and headache.  Did have flu shot.

## 2017-08-14 NOTE — ED Notes (Signed)
Patient is the room resting with call bell in reach

## 2017-08-14 NOTE — ED Provider Notes (Signed)
[facility] Provider Note   CSN: [id number] Arrival date & time: 08/14/17  1008   History   Chief Complaint Chief Complaint  Patient presents with  . URI  . Shortness of Breath    HPI Brandi Andrews is a 26 y.o. female.  HPI  Patient to the ER with complaints of coughing,soreness to the chest and abdomen with coughing, nasal congestion, sore throat and body aches. She has had yellow sputum production. Denies having fever recently but thinks she may have been febrile a few days ago. She works at [employer] and did have her flu shot. Her vital signs are stable and she is well appearing.  Denies neck pain, headaches, n/V/D, confusion, back pain,.  Past Medical History:  Diagnosis Date  . Obesity   . Ovarian cyst     Patient Active Problem List   Diagnosis Date Noted  . Atypical squamous cells cannot exclude high grade squamous intraepithelial lesion on cytologic smear of cervix (ASC-H) 03/04/2017  . Right ovarian cyst 02/26/2017  . BMI 40.0-44.9, adult ([place]) 02/26/2017  . RLQ abdominal pain 02/26/2017    History reviewed. No pertinent surgical history.  OB History    Gravida Para Term Preterm AB Living   1       1 0   SAB TAB Ectopic Multiple Live Births   1       0      Obstetric Comments   G1: 2012 early SAB       Home Medications    Prior to Admission medications   Medication Sig Start Date End Date Taking? Authorizing Provider  acetaminophen (TYLENOL) 500 MG tablet Take 500 mg by mouth every 6 (six) hours as needed for mild pain.   Yes [provider]  albuterol (PROVENTIL HFA;VENTOLIN HFA) 108 (90 Base) MCG/ACT inhaler Inhale 1-2 puffs into the lungs every 6 (six) hours as needed for wheezing or shortness of breath. 06/10/17  Yes Lysbeth Penner, FNP  guaiFENesin-dextromethorphan (ROBITUSSIN DM) 100-10 MG/5ML syrup Take 5 mLs by mouth every 4 (four) hours as needed for cough. 08/14/17    Delos Haring, PA-C  meloxicam (MOBIC) 15 MG tablet Take 1 tablet (15 mg total) by mouth daily. Patient not taking: Reported on 08/14/2017 07/03/17   Barnet Glasgow, NP  oxyCODONE-acetaminophen (PERCOCET/ROXICET) 5-325 MG tablet Take 1-2 tablets by mouth every 4 (four) hours as needed for severe pain. Patient not taking: Reported on 08/14/2017 07/07/17   Virgel Manifold, MD    Family History Family History  Problem Relation Age of Onset  . Diabetes Mother   . Breast cancer Mother 31    Social History Social History  Substance Use Topics  . Smoking status: Former Smoker    Packs/day: 0.10    Years: 4.00    Types: Cigarettes  . Smokeless tobacco: Never Used  . Alcohol use Yes     Allergies   Mushroom extract complex and Penicillins   Review of Systems Review of Systems Negative ROS aside from pertinent positives and negatives as listed in HPI   Physical Exam Updated Vital Signs BP 110/61 (BP Location: Right Arm)   Pulse 87   Temp 98.6 F (37 C) (Oral)   Resp 20   Ht 5\' 7"  (1.702 m)   Wt 106.6 kg (235 lb)   LMP 08/14/2017   SpO2 100%   BMI 36.81 kg/m   Physical Exam  Constitutional: She appears well-developed and well-nourished.  HENT:  Head: Normocephalic and atraumatic.  Eyes: Pupils are equal, round, and reactive to light. Conjunctivae are normal.  Neck: Trachea normal, normal range of motion and full passive range of motion without pain. Neck supple.  Cardiovascular: Normal rate, regular rhythm and normal pulses.   Pulmonary/Chest: Effort normal. No tachypnea. No respiratory distress. She has no decreased breath sounds. She has wheezes (mild). She has no rhonchi. She has no rales. Chest wall is not dull to percussion. She exhibits no tenderness, no crepitus, no edema, no deformity and no retraction.  Abdominal: Soft. Normal appearance and bowel sounds are normal.  Musculoskeletal: Normal range of motion.  Neurological: She is alert. She has normal  strength.  Skin: Skin is warm, dry and intact.  Psychiatric: She has a normal mood and affect. Her speech is normal and behavior is normal. Judgment and thought content normal. Cognition and memory are normal.     ED Treatments / Results  Labs (all labs ordered are listed, but only abnormal results are displayed) Labs Reviewed - No data to display  EKG  EKG Interpretation None       Radiology Dg Chest 2 View  Result Date: 08/14/2017 CLINICAL DATA:  Productive cough short of breath EXAM: CHEST  2 VIEW COMPARISON:  08/20/2016 FINDINGS: The heart size and mediastinal contours are within normal limits. Both lungs are clear. The visualized skeletal structures are unremarkable. IMPRESSION: No active cardiopulmonary disease. Electronically Signed   By: Franchot Gallo M.D.   On: 08/14/2017 10:52    Procedures Procedures (including critical care time)  Medications Ordered in ED Medications  albuterol (PROVENTIL HFA;VENTOLIN HFA) 108 (90 Base) MCG/ACT inhaler 2 puff (not administered)     Initial Impression / Assessment and Plan / ED Course  I have reviewed the triage vital signs and the nursing notes.  Pertinent labs & imaging results that were available during my care of the patient were reviewed by me and considered in my medical decision making (see chart for details).    1. Medications: continue home medications 2. Treatment: Continue to stay well-hydrated.  Continue to alternate between Tylenol and ibuprofen for pain and fever control. 3. Follow Up: Followup with your primary care doctor in 5-7 days for recheck of ongoing symptoms.  Return to emergency [facility] for emergent changing or worsening of symptoms as you may develop an indication for abx or further treatment.  Blood pressure 110/61, pulse 87, temperature 98.6 F (37 C), temperature source Oral, resp. rate 20, height 5\' 7"  (1.702 m), weight 106.6 kg (235 lb), last menstrual period 08/14/2017, SpO2 100  %.  [name] has been evaluated today in the emergency [facility]. The appropriate screening and testing was been performed and I believe the patient to be medically stable for discharge.   Return signs and symptoms have been discussed with the patient and/or caregivers and they have voiced their understanding. The patient has agreed to follow-up with their primary care provider or the referred specialist.    Final Clinical Impressions(s) / ED Diagnoses   Final diagnoses:  Viral URI with cough    New Prescriptions Current Discharge Medication List    START taking these medications   Details  guaiFENesin-dextromethorphan (ROBITUSSIN DM) 100-10 MG/5ML syrup Take 5 mLs by mouth every 4 (four) hours as needed for cough. Qty: 118 mL, Refills: 0         Delos Haring, PA-C 08/14/17 1401    Nat Christen, MD 08/16/17 [phone]

## 2017-08-21 ENCOUNTER — Encounter: Payer: Self-pay | Admitting: Obstetrics and Gynecology

## 2017-08-25 ENCOUNTER — Ambulatory Visit: Payer: Self-pay | Admitting: Obstetrics and Gynecology

## 2017-09-04 ENCOUNTER — Encounter: Payer: Self-pay | Admitting: Obstetrics and Gynecology

## 2017-09-04 ENCOUNTER — Ambulatory Visit: Payer: Self-pay | Admitting: Obstetrics and Gynecology

## 2017-09-05 ENCOUNTER — Encounter: Payer: Self-pay | Admitting: Radiology

## 2017-09-05 ENCOUNTER — Telehealth: Payer: Self-pay | Admitting: *Deleted

## 2017-09-05 NOTE — Progress Notes (Signed)
Patient did not keep GYN appointment for 09/04/2017.  Durene Romans MD Attending Center for [employer])

## 2017-09-05 NOTE — Telephone Encounter (Signed)
Attempted to contact patient via phone.  Phone went to voicemail.  I left message asked she give me a call back at [phone] and ask to speak to me.  I also explained I would send her a detailed My Chart message.

## 2017-09-10 ENCOUNTER — Emergency Department (HOSPITAL_COMMUNITY)
Admission: EM | Admit: 2017-09-10 | Discharge: 2017-09-10 | Disposition: A | Discharge status: Home / Self Care | Payer: Medicaid Other | Attending: Emergency Medicine | Admitting: Emergency Medicine

## 2017-09-10 ENCOUNTER — Encounter (HOSPITAL_COMMUNITY): Payer: Self-pay | Admitting: *Deleted

## 2017-09-10 DIAGNOSIS — R1031 Right lower quadrant pain: Secondary | ICD-10-CM | POA: Insufficient documentation

## 2017-09-10 DIAGNOSIS — Z5321 Procedure and treatment not carried out due to patient leaving prior to being seen by health care provider: Secondary | ICD-10-CM | POA: Insufficient documentation

## 2017-09-10 LAB — COMPREHENSIVE METABOLIC PANEL
ALK PHOS: 56 U/L (ref 38–126)
ALT: 10 U/L — AB (ref 14–54)
ANION GAP: 6 (ref 5–15)
AST: 15 U/L (ref 15–41)
Albumin: 3.4 g/dL — ABNORMAL LOW (ref 3.5–5.0)
BILIRUBIN TOTAL: 0.4 mg/dL (ref 0.3–1.2)
BUN: 9 mg/dL (ref 6–20)
CALCIUM: 8.5 mg/dL — AB (ref 8.9–10.3)
CO2: 21 mmol/L — ABNORMAL LOW (ref 22–32)
Chloride: 109 mmol/L (ref 101–111)
Creatinine, Ser: 0.78 mg/dL (ref 0.44–1.00)
GFR calc Af Amer: >60 mL/min (ref >60)
GFR calc non Af Amer: >60 mL/min (ref >60)
GLUCOSE: 90 mg/dL (ref 65–99)
Potassium: 3.9 mmol/L (ref 3.5–5.1)
SODIUM: 136 mmol/L (ref 135–145)
TOTAL PROTEIN: 6.5 g/dL (ref 6.5–8.1)

## 2017-09-10 LAB — URINALYSIS, ROUTINE W REFLEX MICROSCOPIC
Bacteria, UA: NONE SEEN
Bilirubin Urine: NEGATIVE
GLUCOSE, UA: NEGATIVE mg/dL
Ketones, ur: NEGATIVE mg/dL
Leukocytes, UA: NEGATIVE
NITRITE: NEGATIVE
Protein, ur: NEGATIVE mg/dL
SPECIFIC GRAVITY, URINE: 1.015 (ref 1.005–1.030)
pH: 5 (ref 5.0–8.0)

## 2017-09-10 LAB — CBC
HCT: 33.5 % — ABNORMAL LOW (ref 36.0–46.0)
Hemoglobin: 11.2 g/dL — ABNORMAL LOW (ref 12.0–15.0)
MCH: 27.9 pg (ref 26.0–34.0)
MCHC: 33.4 g/dL (ref 30.0–36.0)
MCV: 83.5 fL (ref 78.0–100.0)
Platelets: 362 10*3/uL (ref 150–400)
RBC: 4.01 MIL/uL (ref 3.87–5.11)
RDW: 13.8 % (ref 11.5–15.5)
WBC: 8.2 10*3/uL (ref 4.0–10.5)

## 2017-09-10 LAB — LIPASE, BLOOD: LIPASE: 43 U/L (ref 11–51)

## 2017-09-10 LAB — POC URINE PREG, ED: Preg Test, Ur: NEGATIVE

## 2017-09-10 NOTE — ED Notes (Signed)
Pt mother up to nurse first inquiring about pt results and current wait. Updated her on status and let her know I will be rounding in lobby to check on her. Then pt mother came to nurse first stating the patient had left and did not want to wait.

## 2017-09-10 NOTE — ED Triage Notes (Signed)
Pt reports right lower abdominal pain for two days. Feels like when the last time she had an ovarian cyst. No n/v, fevers, urinary or vaginal symptoms. No meds PTA

## 2017-10-23 ENCOUNTER — Other Ambulatory Visit: Payer: Self-pay

## 2017-10-23 ENCOUNTER — Encounter (HOSPITAL_COMMUNITY): Payer: Self-pay | Admitting: *Deleted

## 2017-10-23 ENCOUNTER — Emergency Department (HOSPITAL_COMMUNITY)
Admission: EM | Admit: 2017-10-23 | Discharge: 2017-10-23 | Disposition: A | Discharge status: Home / Self Care | Payer: Medicaid Other | Attending: Emergency Medicine | Admitting: Emergency Medicine

## 2017-10-23 DIAGNOSIS — Z87891 Personal history of nicotine dependence: Secondary | ICD-10-CM | POA: Insufficient documentation

## 2017-10-23 DIAGNOSIS — N760 Acute vaginitis: Secondary | ICD-10-CM | POA: Insufficient documentation

## 2017-10-23 DIAGNOSIS — B9689 Other specified bacterial agents as the cause of diseases classified elsewhere: Secondary | ICD-10-CM

## 2017-10-23 DIAGNOSIS — B9789 Other viral agents as the cause of diseases classified elsewhere: Secondary | ICD-10-CM | POA: Insufficient documentation

## 2017-10-23 DIAGNOSIS — R102 Pelvic and perineal pain: Secondary | ICD-10-CM | POA: Insufficient documentation

## 2017-10-23 DIAGNOSIS — Z79899 Other long term (current) drug therapy: Secondary | ICD-10-CM | POA: Insufficient documentation

## 2017-10-23 LAB — URINALYSIS, ROUTINE W REFLEX MICROSCOPIC
BILIRUBIN URINE: NEGATIVE
Glucose, UA: NEGATIVE mg/dL
KETONES UR: NEGATIVE mg/dL
NITRITE: NEGATIVE
Protein, ur: NEGATIVE mg/dL
Specific Gravity, Urine: 1.011 (ref 1.005–1.030)
pH: 6 (ref 5.0–8.0)

## 2017-10-23 LAB — CBC WITH DIFFERENTIAL/PLATELET
BASOS PCT: 0 %
Basophils Absolute: 0 10*3/uL (ref 0.0–0.1)
Eosinophils Absolute: 0.1 10*3/uL (ref 0.0–0.7)
Eosinophils Relative: 1 %
HEMATOCRIT: 35.6 % — AB (ref 36.0–46.0)
HEMOGLOBIN: 12.1 g/dL (ref 12.0–15.0)
LYMPHS ABS: 2.5 10*3/uL (ref 0.7–4.0)
LYMPHS PCT: 29 %
MCH: 28.2 pg (ref 26.0–34.0)
MCHC: 34 g/dL (ref 30.0–36.0)
MCV: 83 fL (ref 78.0–100.0)
MONO ABS: 0.4 10*3/uL (ref 0.1–1.0)
MONOS PCT: 4 %
NEUTROS ABS: 5.6 10*3/uL (ref 1.7–7.7)
Neutrophils Relative %: 66 %
Platelets: 354 10*3/uL (ref 150–400)
RBC: 4.29 MIL/uL (ref 3.87–5.11)
RDW: 13.9 % (ref 11.5–15.5)
WBC: 8.6 10*3/uL (ref 4.0–10.5)

## 2017-10-23 LAB — WET PREP, GENITAL
Sperm: NONE SEEN
Trich, Wet Prep: NONE SEEN
Yeast Wet Prep HPF POC: NONE SEEN

## 2017-10-23 LAB — COMPREHENSIVE METABOLIC PANEL
ALBUMIN: 4 g/dL (ref 3.5–5.0)
ALK PHOS: 64 U/L (ref 38–126)
ALT: 10 U/L — ABNORMAL LOW (ref 14–54)
ANION GAP: 9 (ref 5–15)
AST: 17 U/L (ref 15–41)
BUN: 8 mg/dL (ref 6–20)
CALCIUM: 9 mg/dL (ref 8.9–10.3)
CHLORIDE: 107 mmol/L (ref 101–111)
CO2: 22 mmol/L (ref 22–32)
Creatinine, Ser: 0.85 mg/dL (ref 0.44–1.00)
GFR calc Af Amer: >60 mL/min (ref >60)
GFR calc non Af Amer: >60 mL/min (ref >60)
GLUCOSE: 98 mg/dL (ref 65–99)
Potassium: 4.4 mmol/L (ref 3.5–5.1)
SODIUM: 138 mmol/L (ref 135–145)
Total Bilirubin: 0.4 mg/dL (ref 0.3–1.2)
Total Protein: 7.8 g/dL (ref 6.5–8.1)

## 2017-10-23 LAB — I-STAT BETA HCG BLOOD, ED (MC, WL, AP ONLY): I-stat hCG, quantitative: <5 m[IU]/mL (ref <5)

## 2017-10-23 LAB — RAPID HIV SCREEN (HIV 1/2 AB+AG)
HIV 1/2 ANTIBODIES: NONREACTIVE
HIV-1 P24 ANTIGEN - HIV24: NONREACTIVE
INTERPRETATION (HIV AG AB): NONREACTIVE

## 2017-10-23 LAB — LIPASE, BLOOD: Lipase: 35 U/L (ref 11–51)

## 2017-10-23 LAB — POC URINE PREG, ED: PREG TEST UR: NEGATIVE

## 2017-10-23 MED ORDER — METRONIDAZOLE 500 MG PO TABS: 500.0000 mg | ORAL_TABLET | Freq: Two times a day (BID) | ORAL | 0 refills | Status: DC

## 2017-10-23 MED ORDER — ONDANSETRON 4 MG PO TBDP
4.0000 mg | ORAL_TABLET | Freq: Once | ORAL | Status: AC
  Administered 2017-10-23: 4 mg via ORAL
  Filled 2017-10-23: qty 1

## 2017-10-23 MED ORDER — IBUPROFEN 800 MG PO TABS: 800.0000 mg | ORAL_TABLET | Freq: Three times a day (TID) | ORAL | 0 refills | Status: DC

## 2017-10-23 MED ORDER — TRAMADOL HCL 50 MG PO TABS: 50.0000 mg | ORAL_TABLET | Freq: Four times a day (QID) | ORAL | 0 refills | Status: DC | PRN

## 2017-10-23 MED ORDER — OXYCODONE-ACETAMINOPHEN 5-325 MG PO TABS
1.0000 | ORAL_TABLET | Freq: Once | ORAL | Status: AC
  Administered 2017-10-23: 1 via ORAL
  Filled 2017-10-23: qty 1

## 2017-10-23 NOTE — ED Triage Notes (Signed)
Pt reports right sided abdominal pain. Pt states that she has hx of ovarian cysts. Pt reports nausea.

## 2017-10-23 NOTE — ED Provider Notes (Signed)
Patient placed in [facility] pathway, seen and evaluated for chief complaint of rlq abd pain.  Pertinent H&P findings include hx of R ovarian cyst, progressive pain x 3 days, usually better by now. Unrelieved with motrin or rest. + nausae no urinary or vaginal sxs. LMP 10/01/2017. Not sexually active..  Based on initial evaluation, labs ARE indicated and radiology studies ARE indicated.  Patient counseled on process, plan, and necessity for staying for completing the evaluation    Onica, Davidovich, PA-C 10/23/17 1359    Pattricia Boss, MD 10/23/17 [phone]

## 2017-10-23 NOTE — Discharge Instructions (Signed)
Take tylenol or ibuprofen as needed for your pain.  Your pain is likely due to a ruptured ovarian cyst.  Take tramadol if the pain is severe.  Take flagyl as treatment for bacterial vaginosis.

## 2017-10-23 NOTE — ED Provider Notes (Signed)
[facility] EMERGENCY DEPARTMENT Provider Note   CSN: [id number] Arrival date & time: 10/23/17  1243     History   Chief Complaint Chief Complaint  Patient presents with  . Abdominal Pain    HPI Brandi Andrews is a 26 y.o. female.  HPI   25 year old obese female with history of ovarian cyst presenting for evaluation of abdominal pain.  Patient report for the past 3 days she has had pain to her right lower abdomen felt similar to prior ovarian cyst.  She describes her pain as a sharp sensation, waxing waning, sometimes worsening with positional change and currently rates as 6 out of 10.  She endorsed mild nausea without vomiting.  No fever, chills, chest pain, trouble breathing, back pain, dysuria, hematuria, vaginal bleeding or vaginal discharge.  Her last menstrual period was 10/02/17.  She endorses normal appetite.  She has been taking ibuprofen at home with some improvement but her symptoms still persist.  Past Medical History:  Diagnosis Date  . Obesity   . Ovarian cyst     Patient Active Problem List   Diagnosis Date Noted  . Atypical squamous cells cannot exclude high grade squamous intraepithelial lesion on cytologic smear of cervix (ASC-H) 03/04/2017  . Right ovarian cyst 02/26/2017  . BMI 40.0-44.9, adult ([place]) 02/26/2017  . RLQ abdominal pain 02/26/2017    History reviewed. No pertinent surgical history.  OB History    Gravida Para Term Preterm AB Living   1       1 0   SAB TAB Ectopic Multiple Live Births   1       0      Obstetric Comments   G1: 2012 early SAB       Home Medications    Prior to Admission medications   Medication Sig Start Date End Date Taking? Authorizing Provider  acetaminophen (TYLENOL) 500 MG tablet Take 500-1,000 mg every 6 (six) hours as needed by mouth (for pain).     [provider]  albuterol (PROVENTIL HFA;VENTOLIN HFA) 108 (90 Base) MCG/ACT inhaler Inhale 1-2 puffs into the lungs every 6 (six)  hours as needed for wheezing or shortness of breath. 06/10/17   Lysbeth Penner, FNP  guaiFENesin-dextromethorphan (ROBITUSSIN DM) 100-10 MG/5ML syrup Take 5 mLs by mouth every 4 (four) hours as needed for cough. 08/14/17   Delos Haring, PA-C  meloxicam (MOBIC) 15 MG tablet Take 1 tablet (15 mg total) by mouth daily. Patient not taking: Reported on 09/10/2017 07/03/17   Barnet Glasgow, NP  naproxen sodium (ALEVE) 220 MG tablet Take 220-440 mg 2 (two) times daily as needed by mouth (for pain).    [provider]  oxyCODONE-acetaminophen (PERCOCET/ROXICET) 5-325 MG tablet Take 1-2 tablets by mouth every 4 (four) hours as needed for severe pain. Patient not taking: Reported on 09/10/2017 07/07/17   Virgel Manifold, MD    Family History Family History  Problem Relation Age of Onset  . Diabetes Mother   . Breast cancer Mother 70    Social History Social History   Tobacco Use  . Smoking status: Former Smoker    Packs/day: 0.10    Years: 4.00    Pack years: 0.40    Types: Cigarettes  . Smokeless tobacco: Never Used  Substance Use Topics  . Alcohol use: Yes  . Drug use: No     Allergies   Mushroom extract complex and Penicillins   Review of Systems Review of Systems  All other systems  reviewed and are negative.    Physical Exam Updated Vital Signs BP (!) 135/95 (BP Location: Right Arm)   Pulse 74   Temp 98.6 F (37 C) (Oral)   Resp 18   LMP 10/02/2017   SpO2 97%   Physical Exam  Constitutional: She appears well-developed and well-nourished. No distress.  Obese female resting comfortably in no acute distress.  HENT:  Head: Atraumatic.  Eyes: Conjunctivae are normal.  Neck: Neck supple.  Cardiovascular: Normal rate and regular rhythm.  Pulmonary/Chest: Effort normal and breath sounds normal.  Abdominal: Soft. Normal appearance. There is generalized tenderness (Mild generalized abdominal tenderness with increased tenderness to right lower pelvic region.).   Genitourinary:  Genitourinary Comments: Chaperone present during exam.  No inguinal lymphopathy or inguinal hernia noted.  Normal external genitalia.  No discomfort with speculum insertion.  Normal vaginal vault without vaginal bleeding.  Close cervical loss visualized.  Small amount of vaginal discharge noted.  On bimanual examination, right adnexal tenderness without cervical motion tenderness.  No mass.  Neurological: She is alert.  Skin: No rash noted.  Psychiatric: She has a normal mood and affect.  Nursing note and vitals reviewed.    ED Treatments / Results  Labs (all labs ordered are listed, but only abnormal results are displayed) Labs Reviewed  WET PREP, GENITAL - Abnormal; Notable for the following components:      Result Value   Clue Cells Wet Prep HPF POC PRESENT (*)    WBC, Wet Prep HPF POC MODERATE (*)    All other components within normal limits  COMPREHENSIVE METABOLIC PANEL - Abnormal; Notable for the following components:   ALT 10 (*)    All other components within normal limits  CBC WITH DIFFERENTIAL/PLATELET - Abnormal; Notable for the following components:   HCT 35.6 (*)    All other components within normal limits  URINALYSIS, ROUTINE W REFLEX MICROSCOPIC - Abnormal; Notable for the following components:   APPearance HAZY (*)    Hgb urine dipstick SMALL (*)    Leukocytes, UA LARGE (*)    Bacteria, UA RARE (*)    Squamous Epithelial / LPF 6-30 (*)    All other components within normal limits  LIPASE, BLOOD  RAPID HIV SCREEN (HIV 1/2 AB+AG)  RPR  I-STAT BETA HCG BLOOD, ED ([facility], [facility], [facility] ONLY)  I-STAT BETA HCG BLOOD, ED ([facility], [facility], [facility] ONLY)  POC URINE PREG, ED  GC/CHLAMYDIA PROBE AMP ([facility]) NOT AT [facility]    EKG  EKG Interpretation None       Radiology No results found.  Procedures Procedures (including critical care time)  Medications Ordered in ED Medications  oxyCODONE-acetaminophen (PERCOCET/ROXICET) 5-325 MG per tablet 1 tablet (1 tablet  Oral Given 10/23/17 1359)  ondansetron (ZOFRAN-ODT) disintegrating tablet 4 mg (4 mg Oral Given 10/23/17 1359)     Initial Impression / Assessment and Plan / ED Course  I have reviewed the triage vital signs and the nursing notes.  Pertinent labs & imaging results that were available during my care of the patient were reviewed by me and considered in my medical decision making (see chart for details).     BP (!) 135/95 (BP Location: Right Arm)   Pulse 74   Temp 98.6 F (37 C) (Oral)   Resp 18   LMP 10/02/2017   SpO2 97%    Final Clinical Impressions(s) / ED Diagnoses   Final diagnoses:  Pelvic pain in female  BV (bacterial vaginosis)    ED  Discharge Orders        Ordered    ibuprofen (ADVIL,MOTRIN) 800 MG tablet  3 times daily     10/23/17 1544    traMADol (ULTRAM) 50 MG tablet  Every 6 hours PRN     10/23/17 1544    metroNIDAZOLE (FLAGYL) 500 MG tablet  2 times daily     10/23/17 1544     2:35 PM Patient with history of ovarian cyst here with right lower pelvic pain felt similar to prior ovarian cyst.  She is well-appearing with stable normal vital sign.  She does have some generalized abdominal tenderness without signs of peritonitis.  3:37 PM Pregnancy test is negative, urine without obvious signs of urinary tract infection and patient does not have any urinary symptoms, electrolytes panels are reassuring, normal H&H and normal WBC.  Wet prep shows presence of clue cells and patient would like to be treated for bacterial vaginosis.  Moderate amount of WBC noted.  At this time I have low suspicion for ovarian torsion, tubo-ovarian abscess, or other acute emergent medical condition. Doubt PID.  I encourage patient to take ibuprofen for her pain however patient states that she has been alternate between Tylenol and ibuprofen for the past several days without relief.  Therefore, patient will be given a short course of tramadol as needed for pain. In order to decrease risk of  narcotic abuse. Pt's record were checked using the [place] Controlled Substance database.     Domenic Moras, PA-C 10/23/17 1545    Julianne Rice, MD 10/25/17 1626

## 2017-10-24 ENCOUNTER — Telehealth: Payer: Self-pay | Admitting: Obstetrics and Gynecology

## 2017-10-24 DIAGNOSIS — N83201 Unspecified ovarian cyst, right side: Secondary | ICD-10-CM

## 2017-10-24 LAB — GC/CHLAMYDIA PROBE AMP (~~LOC~~) NOT AT ARMC
CHLAMYDIA, DNA PROBE: NEGATIVE
NEISSERIA GONORRHEA: NEGATIVE

## 2017-10-24 LAB — RPR: RPR: NONREACTIVE

## 2017-10-24 NOTE — Telephone Encounter (Signed)
Appointment made due to having issues with her cyst. She wanted to know if she should have an [place] before this appointment.

## 2017-10-30 NOTE — Addendum Note (Signed)
Addended by: Bethanne Ginger on: 10/30/2017 12:01 PM   Modules accepted: Orders

## 2017-10-30 NOTE — Telephone Encounter (Signed)
Spoke w/ Dr. Ilda Basset ,pt. Does need repeat US. Appt.scheduled for [place] is on 11/12/17 @11am . Spoke with pt. Regarding appt. & she verbally understood.

## 2017-11-11 ENCOUNTER — Ambulatory Visit (HOSPITAL_COMMUNITY)
Admission: EM | Admit: 2017-11-11 | Discharge: 2017-11-11 | Disposition: A | Discharge status: Home / Self Care | Payer: Medicaid Other | Attending: Family Medicine | Admitting: Family Medicine

## 2017-11-11 ENCOUNTER — Encounter (HOSPITAL_COMMUNITY): Payer: Self-pay | Admitting: Family Medicine

## 2017-11-11 DIAGNOSIS — H1031 Unspecified acute conjunctivitis, right eye: Secondary | ICD-10-CM

## 2017-11-11 MED ORDER — ERYTHROMYCIN 5 MG/GM OP OINT: 1.0000 "application " | TOPICAL_OINTMENT | Freq: Four times a day (QID) | OPHTHALMIC | 0 refills | Status: AC

## 2017-11-11 NOTE — ED Provider Notes (Signed)
Brandi [facility]    CSN: [id number] Arrival date & time: 11/11/17  1004     History   Chief Complaint Chief Complaint  Patient presents with  . Eye Problem    HPI Brandi Andrews is a 27 y.o. female.   Brad presents with complaints of redness, itching and drainage from right eye which started yesterday. When she opens the eye it tears more. Without pain but feels irritated. No known trauma or exposure to foreign body. Denies any previous similar. She does wear glasses, does not use contacts. Without fevers or URI symptoms. Denies change to vision, blurred vision, light sensitivity. Denies any known ill contacts.    ROS per HPI.       Past Medical History:  Diagnosis Date  . Obesity   . Ovarian cyst     Patient Active Problem List   Diagnosis Date Noted  . Atypical squamous cells cannot exclude high grade squamous intraepithelial lesion on cytologic smear of cervix (ASC-H) 03/04/2017  . Right ovarian cyst 02/26/2017  . BMI 40.0-44.9, adult ([place]) 02/26/2017  . RLQ abdominal pain 02/26/2017    History reviewed. No pertinent surgical history.  OB History    Gravida Para Term Preterm AB Living   1       1 0   SAB TAB Ectopic Multiple Live Births   1       0      Obstetric Comments   G1: 2012 early SAB       Home Medications    Prior to Admission medications   Medication Sig Start Date End Date Taking? Authorizing Provider  acetaminophen (TYLENOL) 500 MG tablet Take 500-1,000 mg every 6 (six) hours as needed by mouth (for pain).     [provider]  albuterol (PROVENTIL HFA;VENTOLIN HFA) 108 (90 Base) MCG/ACT inhaler Inhale 1-2 puffs into the lungs every 6 (six) hours as needed for wheezing or shortness of breath. 06/10/17   Lysbeth Penner, FNP  erythromycin ophthalmic ointment Place 1 application into the right eye 4 (four) times daily for 7 days. 11/11/17 11/18/17  Zigmund Gottron, NP  ibuprofen (ADVIL,MOTRIN) 800 MG tablet Take 1 tablet  (800 mg total) by mouth 3 (three) times daily. 10/23/17   Domenic Moras, PA-C  naproxen sodium (ALEVE) 220 MG tablet Take 220-440 mg 2 (two) times daily as needed by mouth (for pain).    [provider]    Family History Family History  Problem Relation Age of Onset  . Diabetes Mother   . Breast cancer Mother 34    Social History Social History   Tobacco Use  . Smoking status: Former Smoker    Packs/day: 0.10    Years: 4.00    Pack years: 0.40    Types: Cigarettes  . Smokeless tobacco: Never Used  Substance Use Topics  . Alcohol use: Yes  . Drug use: No     Allergies   Mushroom extract complex and Penicillins   Review of Systems Review of Systems   Physical Exam Triage Vital Signs ED Triage Vitals [11/11/17 1029]  Enc Vitals Group     BP      Pulse Rate 87     Resp 18     Temp 98.4 F (36.9 C)     Temp Source Oral     SpO2 99 %     Weight      Height      Head Circumference      Peak  Flow      Pain Score      Pain Loc      Pain Edu?      Excl. in [place]?    No data found.  Updated Vital Signs Pulse 87   Temp 98.4 F (36.9 C) (Oral)   Resp 18   LMP 10/28/2017   SpO2 99%   Visual Acuity Right Eye Distance: 20/50 Left Eye Distance: 20/40 Bilateral Distance: 20/30  Right Eye Near:   Left Eye Near:    Bilateral Near:     Physical Exam  Constitutional: She is oriented to person, place, and time. She appears well-developed and well-nourished. No distress.  Eyes: EOM are normal. Right eye exhibits discharge. Right conjunctiva is injected. Right pupil is round. Left pupil is not round.  Unaffected left pupil is not round, patient to continue to follow with eye doctor, she does see regularly; right eye with significant redness and discharge noted   Cardiovascular: Normal rate, regular rhythm and normal heart sounds.  Pulmonary/Chest: Effort normal and breath sounds normal.  Neurological: She is alert and oriented to person, place, and time.    Skin: Skin is warm and dry.     UC Treatments / Results  Labs (all labs ordered are listed, but only abnormal results are displayed) Labs Reviewed - No data to display  EKG  EKG Interpretation None       Radiology No results found.  Procedures Procedures (including critical care time)  Medications Ordered in UC Medications - No data to display   Initial Impression / Assessment and Plan / UC Course  I have reviewed the triage vital signs and the nursing notes.  Pertinent labs & imaging results that were available during my care of the patient were reviewed by me and considered in my medical decision making (see chart for details).     erythomycin ointment initiated at this time. Slight difference in vision noted per acuity, without patient glasses. To follow up with eye doctor in two days for recheck, sooner if worsening of symptoms. Patient verbalized understanding and agreeable to plan.    Final Clinical Impressions(s) / UC Diagnoses   Final diagnoses:  Acute conjunctivitis of right eye, unspecified acute conjunctivitis type    ED Discharge Orders        Ordered    erythromycin ophthalmic ointment  4 times daily     11/11/17 1044       Controlled Substance Prescriptions [place] Controlled Substance Registry consulted? Not Applicable   Zigmund Gottron, NP 11/11/17 1118

## 2017-11-11 NOTE — Discharge Instructions (Signed)
Initiate antibiotic ointment. Considered contagious for 24 hours after starting antibiotics. Please follow up with your eye doctor if no improvement of symptoms after approximately 48 hours of antibiotic ointment. If you develop eye pain, change in vision or otherwise worsening of symptoms please see your eye doctor sooner or go to Er.

## 2017-11-11 NOTE — ED Triage Notes (Signed)
Pt here for red right eye with drainage since yesterday. Denies pain. sts some irritation and sense of foreign body. Denies vision changes.

## 2017-11-12 ENCOUNTER — Other Ambulatory Visit (HOSPITAL_COMMUNITY): Payer: Self-pay

## 2017-11-12 ENCOUNTER — Ambulatory Visit (HOSPITAL_COMMUNITY): Payer: Self-pay

## 2017-11-13 ENCOUNTER — Ambulatory Visit (HOSPITAL_COMMUNITY): Payer: Self-pay | Attending: Obstetrics and Gynecology

## 2017-11-27 ENCOUNTER — Ambulatory Visit (INDEPENDENT_AMBULATORY_CARE_PROVIDER_SITE_OTHER): Payer: Self-pay | Admitting: Obstetrics & Gynecology

## 2017-11-27 ENCOUNTER — Encounter: Payer: Self-pay | Admitting: Obstetrics & Gynecology

## 2017-11-27 VITALS — BP 123/91 | HR 83 | Wt 245.0 lb

## 2017-11-27 DIAGNOSIS — N83201 Unspecified ovarian cyst, right side: Secondary | ICD-10-CM

## 2017-11-27 NOTE — Progress Notes (Signed)
Pt stated have cyst and very painful for about 1 year.Brandi Andrews

## 2017-11-27 NOTE — Progress Notes (Signed)
History:  Ms. Jamaiya Tunnell is a [name] with PMHx of ovarian cysts who presents to clinic today for lower abdominal pain. She was recently seen at [facility] ED on 12/27 for similar symptoms, suspicious for recurrent ovarian cyst. Today rating pain 3/10, sharp, in RLQ, non radiating and no other symptoms. Has taken IBU and Ultram as prescribed from recent visits with some relief.  Of note, states only having 1 BM/week which is "normal for her", however endorses hard stool and straining with defecation. Not interested in medication at this time.  The following portions of the patient's history were reviewed and updated as appropriate: allergies, current medications, family history, past medical history, social history, past surgical history and problem list.  Review of Systems:  Review of Systems  Constitutional: Negative for chills, fever, malaise/fatigue and weight loss.  Gastrointestinal: Positive for abdominal pain (RLQ) and constipation (1BM/week). Negative for blood in stool, diarrhea, melena, nausea and vomiting.  Genitourinary: Negative for dysuria, flank pain, frequency, hematuria and urgency.  Neurological: Negative for dizziness, weakness and headaches.  All other systems reviewed and are negative.     Objective:  Physical Exam BP (!) 123/91   Pulse 83   Wt 245 lb (111.1 kg)   LMP 10/28/2017   BMI 38.37 kg/m    Physical Exam  Constitutional: She appears well-developed and well-nourished. No distress.  Cardiovascular: Normal rate, regular rhythm and normal heart sounds.  Pulmonary/Chest: Effort normal and breath sounds normal.  Abdominal: Soft. She exhibits no distension and no mass. There is no tenderness. There is no rebound and no guarding.  Genitourinary:  Genitourinary Comments: Chaperone present. Bimanual exam performed. No appreciable adenexal mass or tenderness bilaterally.  Skin: Skin is warm and dry. She is not diaphoretic.  Psychiatric: She has a normal mood  and affect.  Nursing note and vitals reviewed.   Labs and Imaging No results found for this or any previous visit (from the past 24 hour(s)).  CT 07/07/17 IMPRESSION: 1. 8.3 cm cystic lesion identified in the anterior pelvis, compatible with right ovarian origin. Lesion has an associated septation, better characterized on pelvic ultrasound performed earlier today. Please see that report for recommended follow-up including MRI without and with contrast or gyn consultation. 2. Small volume intraperitoneal free fluid. 3. Otherwise unremarkable CT scan of the abdomen and pelvis.  Pelvic US 07/07/17 IMPRESSION: Slight interval decrease in size of a mildly complicated RIGHT ovarian cyst which contains scattered low level internal echoes and a small thin partial septation. Since these may be difficult to assess completely with [place], further evaluation of simple-appearing cysts >7 cm with MRI or surgical evaluation is recommended according to the Society of Radiologists in Ultrasound 2010 Consensus Conference Statement (D Clovis Riley et al. Management of Asymptomatic Ovarian and other Adnexal Cysts Imaged at [place]: Society of Radiologists in [place] Statement 2010. Radiology 256 (Sept 2010): 943-954.).   Assessment & Plan:  1. Right ovarian cyst - Pain is currently stable and well controlled. Advised OTC IBU as needed - US PELVIC COMPLETE WITH TRANSVAGINAL; Future - Follow up in 4 weeks with Ultrasound results, or sooner if symptoms worsen  2. Constipation - BM 1x/week. Patient declined medication at this time - Encouraged fiber rich foods and supplementation if desired in the future  Jennet Maduro 11/27/2017 2:51 PM

## 2017-11-27 NOTE — Patient Instructions (Signed)
Ovarian Cyst An ovarian cyst is a fluid-filled sac on an ovary. The ovaries are organs that make eggs in women. Most ovarian cysts go away on their own and are not cancerous (are benign). Some cysts need treatment. Follow these instructions at home:  Take over-the-counter and prescription medicines only as told by your doctor.  Do not drive or use heavy machinery while taking prescription pain medicine.  Get pelvic exams and Pap tests as often as told by your doctor.  Return to your normal activities as told by your doctor. Ask your doctor what activities are safe for you.  Do not use any products that contain nicotine or tobacco, such as cigarettes and e-cigarettes. If you need help quitting, ask your doctor.  Keep all follow-up visits as told by your doctor. This is important. Contact a doctor if:  Your periods are: ? Late. ? Irregular. ? Painful.  Your periods stop.  You have pelvic pain that does not go away.  You have pressure on your bladder.  You have trouble making your bladder empty when you pee (urinate).  You have pain during sex.  You have any of the following in your belly (abdomen): ? A feeling of fullness. ? Pressure. ? Discomfort. ? Pain that does not go away. ? Swelling.  You feel sick most of the time.  You have trouble pooping (have constipation).  You are not as hungry as usual (you lose your appetite).  You get very bad acne.  You start to have more hair on your body and face.  You are gaining weight or losing weight without changing your exercise and eating habits.  You think you may be pregnant. Get help right away if:  You have belly pain that is very bad or gets worse.  You cannot eat or drink without throwing up (vomiting).  You suddenly get a fever.  Your period is a lot heavier than usual. This information is not intended to replace advice given to you by your health care provider. Make sure you discuss any questions you have  with your health care provider. Document Released: 04/01/2008 Document Revised: 05/03/2016 Document Reviewed: 03/17/2016 Elsevier Interactive Patient Education  2018 [employer].  

## 2018-02-02 ENCOUNTER — Encounter (HOSPITAL_COMMUNITY): Payer: Self-pay | Admitting: Emergency Medicine

## 2018-02-02 ENCOUNTER — Ambulatory Visit (HOSPITAL_COMMUNITY)
Admission: EM | Admit: 2018-02-02 | Discharge: 2018-02-02 | Disposition: A | Discharge status: Home / Self Care | Payer: Medicaid Other | Attending: Family Medicine | Admitting: Family Medicine

## 2018-02-02 DIAGNOSIS — N83201 Unspecified ovarian cyst, right side: Secondary | ICD-10-CM | POA: Insufficient documentation

## 2018-02-02 DIAGNOSIS — Z87891 Personal history of nicotine dependence: Secondary | ICD-10-CM | POA: Insufficient documentation

## 2018-02-02 DIAGNOSIS — J029 Acute pharyngitis, unspecified: Secondary | ICD-10-CM

## 2018-02-02 DIAGNOSIS — E669 Obesity, unspecified: Secondary | ICD-10-CM | POA: Insufficient documentation

## 2018-02-02 DIAGNOSIS — R69 Illness, unspecified: Secondary | ICD-10-CM

## 2018-02-02 DIAGNOSIS — R05 Cough: Secondary | ICD-10-CM

## 2018-02-02 DIAGNOSIS — Z880 Allergy status to penicillin: Secondary | ICD-10-CM | POA: Insufficient documentation

## 2018-02-02 DIAGNOSIS — R509 Fever, unspecified: Secondary | ICD-10-CM

## 2018-02-02 DIAGNOSIS — Z79899 Other long term (current) drug therapy: Secondary | ICD-10-CM | POA: Insufficient documentation

## 2018-02-02 DIAGNOSIS — J111 Influenza due to unidentified influenza virus with other respiratory manifestations: Secondary | ICD-10-CM

## 2018-02-02 LAB — POCT RAPID STREP A: Streptococcus, Group A Screen (Direct): NEGATIVE

## 2018-02-02 MED ORDER — ONDANSETRON 4 MG PO TBDP
ORAL_TABLET | ORAL | Status: AC
  Filled 2018-02-02: qty 1

## 2018-02-02 MED ORDER — ACETAMINOPHEN 325 MG PO TABS
ORAL_TABLET | ORAL | Status: AC
  Filled 2018-02-02: qty 2

## 2018-02-02 MED ORDER — ONDANSETRON 4 MG PO TBDP: 4.0000 mg | ORAL_TABLET | Freq: Three times a day (TID) | ORAL | 0 refills | Status: DC | PRN

## 2018-02-02 MED ORDER — HYDROCOD POLST-CPM POLST ER 10-8 MG/5ML PO SUER: 5.0000 mL | Freq: Every evening | ORAL | 0 refills | Status: DC | PRN

## 2018-02-02 MED ORDER — ACETAMINOPHEN 325 MG PO TABS
650.0000 mg | ORAL_TABLET | Freq: Once | ORAL | Status: AC
  Administered 2018-02-02: 650 mg via ORAL

## 2018-02-02 MED ORDER — BENZONATATE 100 MG PO CAPS: 100.0000 mg | ORAL_CAPSULE | Freq: Three times a day (TID) | ORAL | 0 refills | Status: DC

## 2018-02-02 MED ORDER — IPRATROPIUM BROMIDE 0.06 % NA SOLN: 2.0000 | Freq: Four times a day (QID) | NASAL | 0 refills | Status: DC

## 2018-02-02 MED ORDER — ONDANSETRON 4 MG PO TBDP
4.0000 mg | ORAL_TABLET | Freq: Once | ORAL | Status: AC
  Administered 2018-02-02: 4 mg via ORAL

## 2018-02-02 NOTE — Discharge Instructions (Signed)
Rapid strep negative. Zofran for nausea/vomiting Tessalon for cough. Tussionex for cough at night. Start atrovent nasal spray for nasal congestion/drainage. You can use over the counter nasal saline rinse such as neti pot for nasal congestion. Keep hydrated, your urine should be clear to pale yellow in color. Tylenol/motrin for fever and pain. Monitor for any worsening of symptoms, chest pain, shortness of breath, wheezing, swelling of the throat, follow up for reevaluation. If experiencing worsening abdominal pain, nausea/vomiting not controlled by medicine, unwilling to jump up and down due to pain, go to the emergency department for further evaluation.  For sore throat try using a honey-based tea. Use 3 teaspoons of honey with juice squeezed from half lemon. Place shaved pieces of ginger into 1/2-1 cup of water and warm over stove top. Then mix the ingredients and repeat every 4 hours as needed.  You can go to Good[employer] to help find coupons/discounts for medicines called into the pharmacy.

## 2018-02-02 NOTE — ED Triage Notes (Signed)
Pt sts URI sx with fever

## 2018-02-02 NOTE — ED Provider Notes (Signed)
Brandi Andrews    CSN: [id number] Arrival date & time: 02/02/18  1140     History   Chief Complaint Chief Complaint  Patient presents with  . Fever  . Cough    HPI Brandi Andrews is a 27 y.o. female.   27 year old female comes in for 2-day history of URI symptoms.  Has had cough, congestion, nausea/vomiting, sore throat.  T-max 101.5.  OTC Robitussin without relief.  Has had trouble keeping liquid and solids down due to nausea and vomiting.  Has had 3 episodes of nonbilious nonbloody vomit that  can also be posttussive.  Some generalized abdominal pain.  Denies diarrhea.  Former smoker.     Past Medical History:  Diagnosis Date  . Obesity   . Ovarian cyst     Patient Active Problem List   Diagnosis Date Noted  . Atypical squamous cells cannot exclude high grade squamous intraepithelial lesion on cytologic smear of cervix (ASC-H) 03/04/2017  . Right ovarian cyst 02/26/2017  . BMI 40.0-44.9, adult ([place]) 02/26/2017  . RLQ abdominal pain 02/26/2017    History reviewed. No pertinent surgical history.  OB History    Gravida  1   Para      Term      Preterm      AB  1   Living  0     SAB  1   TAB      Ectopic      Multiple      Live Births  0        Obstetric Comments  G1: 2012 early SAB         Home Medications    Prior to Admission medications   Medication Sig Start Date End Date Taking? Authorizing Provider  acetaminophen (TYLENOL) 500 MG tablet Take 500-1,000 mg every 6 (six) hours as needed by mouth (for pain).     [provider]  benzonatate (TESSALON) 100 MG capsule Take 1 capsule (100 mg total) by mouth every 8 (eight) hours. 02/02/18   Tasia Catchings, Cederic Mozley V, PA-C  chlorpheniramine-HYDROcodone (TUSSIONEX PENNKINETIC ER) 10-8 MG/5ML SUER Take 5 mLs by mouth at bedtime as needed for cough. 02/02/18   Tasia Catchings, Robbin Escher V, PA-C  ibuprofen (ADVIL,MOTRIN) 800 MG tablet Take 1 tablet (800 mg total) by mouth 3 (three) times daily. 10/23/17   Domenic Moras, PA-C  ipratropium (ATROVENT) 0.06 % nasal spray Place 2 sprays into both nostrils 4 (four) times daily. 02/02/18   Tasia Catchings, Paisley Grajeda V, PA-C  naproxen sodium (ALEVE) 220 MG tablet Take 220-440 mg 2 (two) times daily as needed by mouth (for pain).    [provider]  ondansetron (ZOFRAN ODT) 4 MG disintegrating tablet Take 1 tablet (4 mg total) by mouth every 8 (eight) hours as needed for nausea or vomiting. 02/02/18   Ok Edwards, PA-C    Family History Family History  Problem Relation Age of Onset  . Diabetes Mother   . Breast cancer Mother 60    Social History Social History   Tobacco Use  . Smoking status: Former Smoker    Packs/day: 0.10    Years: 4.00    Pack years: 0.40    Types: Cigarettes  . Smokeless tobacco: Never Used  Substance Use Topics  . Alcohol use: Yes  . Drug use: No     Allergies   Mushroom extract complex and Penicillins   Review of Systems Review of Systems  Reason unable to perform ROS: See HPI as above.  Physical Exam Triage Vital Signs ED Triage Vitals [02/02/18 1202]  Enc Vitals Group     BP 123/77     Pulse Rate (!) 120     Resp 18     Temp (!) 101.5 F (38.6 C)     Temp Source Oral     SpO2 100 %     Weight      Height      Head Circumference      Peak Flow      Pain Score 8     Pain Loc      Pain Edu?      Excl. in [place]?    No data found.  Updated Vital Signs BP 123/77 (BP Location: Left Arm)   Pulse (!) 120   Temp (!) 101.5 F (38.6 C) (Oral)   Resp 18   SpO2 100%   Physical Exam  Constitutional: She is oriented to person, place, and time. She appears well-developed and well-nourished. No distress.  HENT:  Head: Normocephalic and atraumatic.  Right Ear: Tympanic membrane, external ear and ear canal normal. Tympanic membrane is not erythematous and not bulging.  Left Ear: Tympanic membrane, external ear and ear canal normal. Tympanic membrane is not erythematous and not bulging.  Nose: Mucosal edema and  rhinorrhea present. Right sinus exhibits no maxillary sinus tenderness and no frontal sinus tenderness. Left sinus exhibits no maxillary sinus tenderness and no frontal sinus tenderness.  Mouth/Throat: Uvula is midline, oropharynx is clear and moist and mucous membranes are normal.  Eyes: Pupils are equal, round, and reactive to light. Conjunctivae are normal.  Neck: Normal range of motion. Neck supple.  Cardiovascular: Regular rhythm and normal heart sounds. Tachycardia present. Exam reveals no gallop and no friction rub.  No murmur heard. Pulmonary/Chest: Effort normal and breath sounds normal. She has no decreased breath sounds. She has no wheezes. She has no rhonchi. She has no rales.  Abdominal: Soft. Normal appearance and bowel sounds are normal. There is no tenderness. There is no rigidity, no rebound, no guarding and no CVA tenderness.  Lymphadenopathy:    She has no cervical adenopathy.  Neurological: She is alert and oriented to person, place, and time.  Skin: Skin is warm and dry.  Psychiatric: She has a normal mood and affect. Her behavior is normal. Judgment normal.     UC Treatments / Results  Labs (all labs ordered are listed, but only abnormal results are displayed) Labs Reviewed  CULTURE, GROUP A STREP [facility])  POCT RAPID STREP A    EKG None Radiology No results found.  Procedures Procedures (including critical care time)  Medications Ordered in UC Medications  acetaminophen (TYLENOL) tablet 650 mg (650 mg Oral Given 02/02/18 1205)  ondansetron (ZOFRAN-ODT) disintegrating tablet 4 mg (4 mg Oral Given 02/02/18 1301)     Initial Impression / Assessment and Plan / UC Course  I have reviewed the triage vital signs and the nursing notes.  Pertinent labs & imaging results that were available during my care of the patient were reviewed by me and considered in my medical decision making (see chart for details).    Rapid strep negative. Patient nontoxic in appearance.  Discussed flu like symptoms, can cover with tamiflu, patient would like to defer tamiflu and treat symptomatically. Symptomatic treatment discussed. Push fluids. Return precautions given.   Final Clinical Impressions(s) / UC Diagnoses   Final diagnoses:  Influenza-like illness    ED Discharge Orders  Ordered    chlorpheniramine-HYDROcodone (TUSSIONEX PENNKINETIC ER) 10-8 MG/5ML SUER  At bedtime PRN     02/02/18 1258    benzonatate (TESSALON) 100 MG capsule  Every 8 hours     02/02/18 1258    ipratropium (ATROVENT) 0.06 % nasal spray  4 times daily     02/02/18 1258    ondansetron (ZOFRAN ODT) 4 MG disintegrating tablet  Every 8 hours PRN     02/02/18 1258        Ok Edwards, PA-C 02/02/18 1304

## 2018-02-04 ENCOUNTER — Telehealth (HOSPITAL_COMMUNITY): Payer: Self-pay

## 2018-02-04 LAB — CULTURE, GROUP A STREP (THRC)

## 2018-02-04 NOTE — Telephone Encounter (Signed)
Pt contacted regarding throat culture being positive for non group A Strep germ.  This is a finding of uncertain significance; not the typical 'strep throat' germ.  Pt endorses feeling better.

## 2018-03-02 ENCOUNTER — Encounter (HOSPITAL_COMMUNITY): Payer: Self-pay | Admitting: *Deleted

## 2018-03-02 ENCOUNTER — Inpatient Hospital Stay (HOSPITAL_COMMUNITY)
Admission: AD | Admit: 2018-03-02 | Discharge: 2018-03-03 | DRG: 743 | Disposition: A | Discharge status: Home / Self Care | Payer: Self-pay | Source: Ambulatory Visit | Attending: Obstetrics & Gynecology | Admitting: Obstetrics & Gynecology

## 2018-03-02 DIAGNOSIS — N83201 Unspecified ovarian cyst, right side: Secondary | ICD-10-CM | POA: Diagnosis present

## 2018-03-02 DIAGNOSIS — N83519 Torsion of ovary and ovarian pedicle, unspecified side: Secondary | ICD-10-CM

## 2018-03-02 DIAGNOSIS — Z6837 Body mass index (BMI) 37.0-37.9, adult: Secondary | ICD-10-CM

## 2018-03-02 DIAGNOSIS — N83511 Torsion of right ovary and ovarian pedicle: Principal | ICD-10-CM | POA: Diagnosis present

## 2018-03-02 DIAGNOSIS — Z91199 Patient's noncompliance with other medical treatment and regimen due to unspecified reason: Secondary | ICD-10-CM

## 2018-03-02 DIAGNOSIS — Z880 Allergy status to penicillin: Secondary | ICD-10-CM

## 2018-03-02 DIAGNOSIS — Z9119 Patient's noncompliance with other medical treatment and regimen: Secondary | ICD-10-CM

## 2018-03-02 DIAGNOSIS — F17210 Nicotine dependence, cigarettes, uncomplicated: Secondary | ICD-10-CM | POA: Diagnosis present

## 2018-03-02 DIAGNOSIS — R109 Unspecified abdominal pain: Secondary | ICD-10-CM

## 2018-03-02 HISTORY — DX: Other specified health status: Z78.9

## 2018-03-02 LAB — URINALYSIS, ROUTINE W REFLEX MICROSCOPIC
BILIRUBIN URINE: NEGATIVE
GLUCOSE, UA: NEGATIVE mg/dL
Ketones, ur: NEGATIVE mg/dL
NITRITE: NEGATIVE
Protein, ur: NEGATIVE mg/dL
SPECIFIC GRAVITY, URINE: 1.016 (ref 1.005–1.030)
pH: 6 (ref 5.0–8.0)

## 2018-03-02 LAB — CBC
HEMATOCRIT: 35 % — AB (ref 36.0–46.0)
Hemoglobin: 11.8 g/dL — ABNORMAL LOW (ref 12.0–15.0)
MCH: 27.7 pg (ref 26.0–34.0)
MCHC: 33.7 g/dL (ref 30.0–36.0)
MCV: 82.2 fL (ref 78.0–100.0)
Platelets: 350 10*3/uL (ref 150–400)
RBC: 4.26 MIL/uL (ref 3.87–5.11)
RDW: 14.4 % (ref 11.5–15.5)
WBC: 11.3 10*3/uL — AB (ref 4.0–10.5)

## 2018-03-02 LAB — POCT PREGNANCY, URINE: Preg Test, Ur: NEGATIVE

## 2018-03-02 MED ORDER — KETOROLAC TROMETHAMINE 60 MG/2ML IM SOLN
60.0000 mg | Freq: Once | INTRAMUSCULAR | Status: AC
  Administered 2018-03-02: 60 mg via INTRAMUSCULAR
  Filled 2018-03-02: qty 2

## 2018-03-02 NOTE — MAU Note (Signed)
Pt here with c/o right sided abdominal pain for about 3 days. Ibuprofen is not helping. Nauseated. Not having any bleeding.

## 2018-03-03 ENCOUNTER — Other Ambulatory Visit: Payer: Self-pay

## 2018-03-03 ENCOUNTER — Inpatient Hospital Stay (HOSPITAL_COMMUNITY): Payer: Self-pay

## 2018-03-03 ENCOUNTER — Inpatient Hospital Stay (HOSPITAL_COMMUNITY): Payer: Self-pay | Admitting: Certified Registered Nurse Anesthetist

## 2018-03-03 ENCOUNTER — Encounter (HOSPITAL_COMMUNITY): Payer: Self-pay | Admitting: *Deleted

## 2018-03-03 ENCOUNTER — Encounter (HOSPITAL_COMMUNITY)
Admission: AD | Disposition: A | Discharge status: Home / Self Care | Payer: Self-pay | Source: Ambulatory Visit | Attending: Obstetrics and Gynecology

## 2018-03-03 DIAGNOSIS — N83511 Torsion of right ovary and ovarian pedicle: Principal | ICD-10-CM

## 2018-03-03 DIAGNOSIS — Z91199 Patient's noncompliance with other medical treatment and regimen due to unspecified reason: Secondary | ICD-10-CM

## 2018-03-03 DIAGNOSIS — N83519 Torsion of ovary and ovarian pedicle, unspecified side: Secondary | ICD-10-CM

## 2018-03-03 DIAGNOSIS — Z9119 Patient's noncompliance with other medical treatment and regimen: Secondary | ICD-10-CM

## 2018-03-03 HISTORY — PX: LAPAROSCOPIC SALPINGO OOPHERECTOMY: SHX5927

## 2018-03-03 LAB — COMPREHENSIVE METABOLIC PANEL
ALBUMIN: 4.2 g/dL (ref 3.5–5.0)
ALT: 12 U/L — ABNORMAL LOW (ref 14–54)
AST: 16 U/L (ref 15–41)
Alkaline Phosphatase: 58 U/L (ref 38–126)
Anion gap: 10 (ref 5–15)
BILIRUBIN TOTAL: 0.6 mg/dL (ref 0.3–1.2)
BUN: 10 mg/dL (ref 6–20)
CO2: 21 mmol/L — ABNORMAL LOW (ref 22–32)
Calcium: 9 mg/dL (ref 8.9–10.3)
Chloride: 109 mmol/L (ref 101–111)
Creatinine, Ser: 0.74 mg/dL (ref 0.44–1.00)
GFR calc Af Amer: >60 mL/min (ref >60)
GLUCOSE: 121 mg/dL — AB (ref 65–99)
Potassium: 3.7 mmol/L (ref 3.5–5.1)
Sodium: 140 mmol/L (ref 135–145)
TOTAL PROTEIN: 7.9 g/dL (ref 6.5–8.1)

## 2018-03-03 LAB — TYPE AND SCREEN
ABO/RH(D): O POS
Antibody Screen: NEGATIVE

## 2018-03-03 LAB — ABO/RH: ABO/RH(D): O POS

## 2018-03-03 SURGERY — SALPINGO-OOPHORECTOMY, LAPAROSCOPIC
Anesthesia: General | Site: Abdomen | Laterality: Right

## 2018-03-03 MED ORDER — LIDOCAINE HCL (CARDIAC) PF 100 MG/5ML IV SOSY
PREFILLED_SYRINGE | INTRAVENOUS | Status: DC | PRN
  Administered 2018-03-03: 30 mg via INTRAVENOUS

## 2018-03-03 MED ORDER — HYDROMORPHONE HCL 1 MG/ML IJ SOLN
1.0000 mg | Freq: Once | INTRAMUSCULAR | Status: AC
  Administered 2018-03-03: 1 mg via INTRAVENOUS
  Filled 2018-03-03: qty 1

## 2018-03-03 MED ORDER — ROCURONIUM BROMIDE 100 MG/10ML IV SOLN
INTRAVENOUS | Status: DC | PRN
  Administered 2018-03-03: 50 mg via INTRAVENOUS

## 2018-03-03 MED ORDER — MIDAZOLAM HCL 2 MG/2ML IJ SOLN
INTRAMUSCULAR | Status: DC | PRN
  Administered 2018-03-03 (×2): 1 mg via INTRAVENOUS

## 2018-03-03 MED ORDER — MEPERIDINE HCL 25 MG/ML IJ SOLN: 6.2500 mg | INTRAMUSCULAR | Status: DC | PRN

## 2018-03-03 MED ORDER — OXYCODONE-ACETAMINOPHEN 5-325 MG PO TABS: 1.0000 | ORAL_TABLET | ORAL | 0 refills | Status: DC | PRN

## 2018-03-03 MED ORDER — SODIUM CHLORIDE 0.9 % IR SOLN
Status: DC | PRN
  Administered 2018-03-03: 3000 mL

## 2018-03-03 MED ORDER — GLYCOPYRROLATE 0.2 MG/ML IJ SOLN
INTRAMUSCULAR | Status: AC
  Filled 2018-03-03: qty 1

## 2018-03-03 MED ORDER — HYDROMORPHONE HCL 1 MG/ML IJ SOLN
INTRAMUSCULAR | Status: AC
  Filled 2018-03-03: qty 1

## 2018-03-03 MED ORDER — FENTANYL CITRATE (PF) 100 MCG/2ML IJ SOLN
50.0000 ug | INTRAMUSCULAR | Status: AC | PRN
  Administered 2018-03-03 (×3): 50 ug via INTRAVENOUS
  Administered 2018-03-03: 100 ug via INTRAVENOUS
  Filled 2018-03-03 (×2): qty 2

## 2018-03-03 MED ORDER — PROMETHAZINE HCL 25 MG/ML IJ SOLN: 6.2500 mg | INTRAMUSCULAR | Status: DC | PRN

## 2018-03-03 MED ORDER — ONDANSETRON HCL 4 MG/2ML IJ SOLN
INTRAMUSCULAR | Status: DC | PRN
  Administered 2018-03-03: 4 mg via INTRAVENOUS

## 2018-03-03 MED ORDER — HYDROMORPHONE HCL 1 MG/ML IJ SOLN
0.2500 mg | INTRAMUSCULAR | Status: DC | PRN
  Administered 2018-03-03: 0.5 mg via INTRAVENOUS

## 2018-03-03 MED ORDER — FENTANYL CITRATE (PF) 250 MCG/5ML IJ SOLN
INTRAMUSCULAR | Status: AC
  Filled 2018-03-03: qty 5

## 2018-03-03 MED ORDER — LIDOCAINE HCL (CARDIAC) PF 100 MG/5ML IV SOSY
PREFILLED_SYRINGE | INTRAVENOUS | Status: AC
  Filled 2018-03-03: qty 5

## 2018-03-03 MED ORDER — DEXAMETHASONE SODIUM PHOSPHATE 10 MG/ML IJ SOLN
INTRAMUSCULAR | Status: DC | PRN
  Administered 2018-03-03: 10 mg via INTRAVENOUS

## 2018-03-03 MED ORDER — DEXAMETHASONE SODIUM PHOSPHATE 10 MG/ML IJ SOLN
INTRAMUSCULAR | Status: AC
  Filled 2018-03-03: qty 1

## 2018-03-03 MED ORDER — SCOPOLAMINE 1 MG/3DAYS TD PT72
MEDICATED_PATCH | TRANSDERMAL | Status: DC | PRN
  Administered 2018-03-03: 1 via TRANSDERMAL

## 2018-03-03 MED ORDER — IOPAMIDOL (ISOVUE-300) INJECTION 61%
100.0000 mL | Freq: Once | INTRAVENOUS | Status: AC | PRN
  Administered 2018-03-03: 100 mL via INTRAVENOUS

## 2018-03-03 MED ORDER — MIDAZOLAM HCL 2 MG/2ML IJ SOLN: 0.5000 mg | Freq: Once | INTRAMUSCULAR | Status: DC | PRN

## 2018-03-03 MED ORDER — IOPAMIDOL (ISOVUE-300) INJECTION 61%
30.0000 mL | INTRAVENOUS | Status: AC
  Administered 2018-03-03 (×2): 30 mL via ORAL

## 2018-03-03 MED ORDER — HYDROMORPHONE HCL 1 MG/ML IJ SOLN
INTRAMUSCULAR | Status: DC | PRN
  Administered 2018-03-03: 1 mg via INTRAVENOUS

## 2018-03-03 MED ORDER — ONDANSETRON HCL 4 MG/2ML IJ SOLN
INTRAMUSCULAR | Status: AC
  Filled 2018-03-03: qty 2

## 2018-03-03 MED ORDER — PROPOFOL 10 MG/ML IV BOLUS
INTRAVENOUS | Status: DC | PRN
  Administered 2018-03-03: 200 mg via INTRAVENOUS

## 2018-03-03 MED ORDER — SCOPOLAMINE 1 MG/3DAYS TD PT72
MEDICATED_PATCH | TRANSDERMAL | Status: AC
  Filled 2018-03-03: qty 1

## 2018-03-03 MED ORDER — PROPOFOL 10 MG/ML IV BOLUS
INTRAVENOUS | Status: AC
  Filled 2018-03-03: qty 20

## 2018-03-03 MED ORDER — GLYCOPYRROLATE 0.2 MG/ML IJ SOLN
INTRAMUSCULAR | Status: DC | PRN
  Administered 2018-03-03: 0.2 mg via INTRAVENOUS

## 2018-03-03 MED ORDER — SUGAMMADEX SODIUM 200 MG/2ML IV SOLN
INTRAVENOUS | Status: DC | PRN
  Administered 2018-03-03: 200 mg via INTRAVENOUS

## 2018-03-03 MED ORDER — KETOROLAC TROMETHAMINE 30 MG/ML IJ SOLN
INTRAMUSCULAR | Status: AC
  Filled 2018-03-03: qty 1

## 2018-03-03 MED ORDER — ROCURONIUM BROMIDE 100 MG/10ML IV SOLN
INTRAVENOUS | Status: AC
  Filled 2018-03-03: qty 1

## 2018-03-03 MED ORDER — MIDAZOLAM HCL 2 MG/2ML IJ SOLN
INTRAMUSCULAR | Status: AC
  Filled 2018-03-03: qty 2

## 2018-03-03 MED ORDER — BUPIVACAINE HCL (PF) 0.5 % IJ SOLN
INTRAMUSCULAR | Status: AC
  Filled 2018-03-03: qty 30

## 2018-03-03 MED ORDER — DOCUSATE SODIUM 100 MG PO CAPS: 100.0000 mg | ORAL_CAPSULE | Freq: Two times a day (BID) | ORAL | 2 refills | Status: DC | PRN

## 2018-03-03 MED ORDER — SUGAMMADEX SODIUM 200 MG/2ML IV SOLN
INTRAVENOUS | Status: AC
  Filled 2018-03-03: qty 4

## 2018-03-03 MED ORDER — SOD CITRATE-CITRIC ACID 500-334 MG/5ML PO SOLN
30.0000 mL | ORAL | Status: AC
  Administered 2018-03-03: 30 mL via ORAL
  Filled 2018-03-03: qty 15

## 2018-03-03 MED ORDER — LACTATED RINGERS IV SOLN
INTRAVENOUS | Status: DC
  Administered 2018-03-03 (×3): via INTRAVENOUS

## 2018-03-03 MED ORDER — BUPIVACAINE HCL (PF) 0.5 % IJ SOLN
INTRAMUSCULAR | Status: DC | PRN
  Administered 2018-03-03 (×2): 15 mL

## 2018-03-03 MED ORDER — KETOROLAC TROMETHAMINE 30 MG/ML IJ SOLN
INTRAMUSCULAR | Status: DC | PRN
  Administered 2018-03-03: 30 mg via INTRAVENOUS

## 2018-03-03 MED ORDER — IBUPROFEN 800 MG PO TABS: 800.0000 mg | ORAL_TABLET | Freq: Three times a day (TID) | ORAL | 2 refills | Status: DC | PRN

## 2018-03-03 MED ORDER — PROMETHAZINE HCL 25 MG/ML IJ SOLN
12.5000 mg | Freq: Four times a day (QID) | INTRAMUSCULAR | Status: DC | PRN
  Administered 2018-03-03: 25 mg via INTRAVENOUS
  Filled 2018-03-03: qty 1

## 2018-03-03 SURGICAL SUPPLY — 32 items
CABLE HIGH FREQUENCY MONO STRZ (ELECTRODE) IMPLANT
COVER MAYO STAND STRL (DRAPES) ×4 IMPLANT
DERMABOND ADVANCED (GAUZE/BANDAGES/DRESSINGS) ×1
DERMABOND ADVANCED .7 DNX12 (GAUZE/BANDAGES/DRESSINGS) ×3 IMPLANT
DRSG OPSITE POSTOP 3X4 (GAUZE/BANDAGES/DRESSINGS) ×8 IMPLANT
DURAPREP 26ML APPLICATOR (WOUND CARE) ×4 IMPLANT
GLOVE BIOGEL PI IND STRL 7.0 (GLOVE) ×12 IMPLANT
GLOVE BIOGEL PI INDICATOR 7.0 (GLOVE) ×4
GLOVE ECLIPSE 7.0 STRL STRAW (GLOVE) ×4 IMPLANT
GLOVE SURG SS PI 7.0 STRL IVOR (GLOVE) ×12 IMPLANT
GOWN STRL REUS W/TWL LRG LVL3 (GOWN DISPOSABLE) ×8 IMPLANT
NEEDLE INSUFFLATION 120MM (ENDOMECHANICALS) IMPLANT
NS IRRIG 1000ML POUR BTL (IV SOLUTION) ×4 IMPLANT
PACK LAPAROSCOPY BASIN (CUSTOM PROCEDURE TRAY) ×4 IMPLANT
PACK TRENDGUARD 450 HYBRID PRO (MISCELLANEOUS) ×3 IMPLANT
POUCH SPECIMEN RETRIEVAL 10MM (ENDOMECHANICALS) IMPLANT
PROTECTOR NERVE ULNAR (MISCELLANEOUS) ×8 IMPLANT
RETRACTOR WOUND ALXS 19CM XSML (INSTRUMENTS) ×3 IMPLANT
RTRCTR WOUND ALEXIS 19CM XSML (INSTRUMENTS) ×4
SET IRRIG TUBING LAPAROSCOPIC (IRRIGATION / IRRIGATOR) ×4 IMPLANT
SHEARS HARMONIC ACE PLUS 36CM (ENDOMECHANICALS) ×4 IMPLANT
SLEEVE XCEL OPT CAN 5 100 (ENDOMECHANICALS) ×8 IMPLANT
SUT VICRYL 0 UR6 27IN ABS (SUTURE) ×8 IMPLANT
SUT VICRYL 4-0 PS2 18IN ABS (SUTURE) ×4 IMPLANT
SYRINGE 60CC LL (MISCELLANEOUS) ×4 IMPLANT
TOWEL OR 17X24 6PK STRL BLUE (TOWEL DISPOSABLE) ×8 IMPLANT
TRAY FOLEY W/BAG SLVR 14FR (SET/KITS/TRAYS/PACK) ×4 IMPLANT
TRENDGUARD 450 HYBRID PRO PACK (MISCELLANEOUS) ×4
TROCAR 12M 150ML BLUNT (TROCAR) ×4 IMPLANT
TROCAR XCEL NON-BLD 11X100MML (ENDOMECHANICALS) ×4 IMPLANT
TROCAR XCEL NON-BLD 5MMX100MML (ENDOMECHANICALS) ×8 IMPLANT
WARMER LAPAROSCOPE (MISCELLANEOUS) ×4 IMPLANT

## 2018-03-03 NOTE — Discharge Instructions (Signed)

## 2018-03-03 NOTE — Transfer of Care (Signed)
Immediate Anesthesia Transfer of Care Note  Patient: Brandi Andrews  Procedure(s) Performed: LAPAROSCOPIC SALPINGO OOPHORECTOMY (Right Abdomen)  Patient Location: PACU  Anesthesia Type:General  Level of Consciousness: awake, alert  and oriented  Airway & Oxygen Therapy: Patient Spontanous Breathing and Patient connected to nasal cannula oxygen  Post-op Assessment: Report given to RN, Post -op Vital signs reviewed and stable and Patient moving all extremities X 4  Post vital signs: Reviewed and stable  Last Vitals:  Vitals Value Taken Time  BP 105/55 03/03/2018 12:55 PM  Temp    Pulse 57 03/03/2018 12:57 PM  Resp 12 03/03/2018 12:57 PM  SpO2 98 % 03/03/2018 12:57 PM  Vitals shown include unvalidated device data.  Last Pain:  Vitals:   03/03/18 1000  TempSrc:   PainSc: 6       Patients Stated Pain Goal: 2 (17/79/39 0300)  Complications: No apparent anesthesia complications

## 2018-03-03 NOTE — Anesthesia Preprocedure Evaluation (Addendum)
Anesthesia Evaluation  Patient identified by MRN, date of birth, ID band Patient awake    Reviewed: Allergy & Precautions, NPO status , Patient's Chart, lab work & pertinent test results  History of Anesthesia Complications Negative for: history of anesthetic complications  Airway Mallampati: II  TM Distance: >3 FB Neck ROM: Full    Dental  (+) Chipped, Missing, Dental Advisory Given   Pulmonary COPD, Current Smoker,    breath sounds clear to auscultation       Cardiovascular negative cardio ROS   Rhythm:Regular Rate:Normal     Neuro/Psych negative neurological ROS     GI/Hepatic Neg liver ROS, Nausea and vomiting this am   Endo/Other  Morbid obesity  Renal/GU negative Renal ROS     Musculoskeletal   Abdominal (+) + obese,   Peds  Hematology negative hematology ROS (+)   Anesthesia Other Findings   Reproductive/Obstetrics                            Anesthesia Physical Anesthesia Plan  ASA: II  Anesthesia Plan: General   Post-op Pain Management:    Induction: Intravenous  PONV Risk Score and Plan: 3 and Ondansetron, Dexamethasone and Scopolamine patch - Pre-op  Airway Management Planned: Oral ETT  Additional Equipment:   Intra-op Plan:   Post-operative Plan: Extubation in OR  Informed Consent: I have reviewed the patients History and Physical, chart, labs and discussed the procedure including the risks, benefits and alternatives for the proposed anesthesia with the patient or authorized representative who has indicated his/her understanding and acceptance.   Dental advisory given  Plan Discussed with: CRNA and Surgeon  Anesthesia Plan Comments: (Plan routine monitors, GETA)       Anesthesia Quick Evaluation

## 2018-03-03 NOTE — Progress Notes (Signed)
Discharge instructions and prescriptions given to pt. Discussed post-op care, signs and symptoms to report to the doctor, upcoming appointments, and medications. Pt verbalizes understanding and has no questions or concerns at this time. IV taken out and pt tolerated well. Pt leaving hospital in stable condition.

## 2018-03-03 NOTE — Discharge Summary (Signed)
Gynecology Physician Postoperative Discharge Summary  Patient ID: Brandi Andrews MRN: [id number] DOB/AGE: 05-15-1991 27 y.o.  Admit Date: 03/02/2018 Discharge Date: 03/03/2018  Preoperative Diagnoses: Torsion of right ovary and ovarian pedicle due to large ovarian cyst  Procedures: Procedure(s) (LRB): LAPAROSCOPIC SALPINGO OOPHORECTOMY (Right)  Hospital Course:  Brandi Andrews is a [name] admitted for concerns about torsed and nonviable ovary. She had this ovarian pain for several months and deferred surgery; presented with worsening pain and nausea. Imaging showed no flow to the right ovary and it was enlarged to about 10 cm in size.  She subsequently underwent the procedure as mentioned above, her operation was uncomplicated.  For further details about surgery, please refer to the operative report. Patient had an uncomplicated postoperative course. Given that it was done laparoscopically with no immediate complications; and her pain was controlled on oral pain medications, she was ambulating, voiding without difficulty and tolerating regular diet and passing flatus, she was deemed stable for discharge to home.   Significant Labs: CBC Latest Ref Rng & Units 03/02/2018 10/23/2017 09/10/2017  WBC 4.0 - 10.5 K/uL 11.3(H) 8.6 8.2  Hemoglobin 12.0 - 15.0 g/dL 11.8(L) 12.1 11.2(L)  Hematocrit 36.0 - 46.0 % 35.0(L) 35.6(L) 33.5(L)  Platelets 150 - 400 K/uL 350 354 362   CMP Latest Ref Rng & Units 03/02/2018 10/23/2017 09/10/2017  Glucose 65 - 99 mg/dL 121(H) 98 90  BUN 6 - 20 mg/dL 10 8 9   Creatinine 0.44 - 1.00 mg/dL 0.74 0.85 0.78  Sodium 135 - 145 mmol/L 140 138 136  Potassium 3.5 - 5.1 mmol/L 3.7 4.4 3.9  Chloride 101 - 111 mmol/L 109 107 109  CO2 22 - 32 mmol/L 21(L) 22 21(L)  Calcium 8.9 - 10.3 mg/dL 9.0 9.0 8.5(L)  Total Protein 6.5 - 8.1 g/dL 7.9 7.8 6.5  Total Bilirubin 0.3 - 1.2 mg/dL 0.6 0.4 0.4  Alkaline Phos 38 - 126 U/L 58 64 56  AST 15 - 41 U/L 16 17 15   ALT 14 - 54 U/L  12(L) 10(L) 10(L)    Ct Abdomen Pelvis W Contrast  Result Date: 03/03/2018 CLINICAL DATA:  27 year old female with abdominal pain. History of ovarian cysts. EXAM: CT ABDOMEN AND PELVIS WITH CONTRAST TECHNIQUE: Multidetector CT imaging of the abdomen and pelvis was performed using the standard protocol following bolus administration of intravenous contrast. CONTRAST:  127mL ISOVUE-300 IOPAMIDOL (ISOVUE-300) INJECTION 61% COMPARISON:  Abdominal CT dated 07/07/2017 and pelvic ultrasound dated 07/07/2017 FINDINGS: Lower chest: The visualized lung bases are clear. No intra-abdominal free air. Small free fluid within the pelvis. Hepatobiliary: No focal liver abnormality is seen. No gallstones, gallbladder wall thickening, or biliary dilatation. Pancreas: Unremarkable. No pancreatic ductal dilatation or surrounding inflammatory changes. Spleen: Normal in size without focal abnormality. Adrenals/Urinary Tract: Adrenal glands are unremarkable. Kidneys are normal, without renal calculi, focal lesion, or hydronephrosis. Bladder is unremarkable. Stomach/Bowel: Stomach is within normal limits. Appendix appears normal. No evidence of bowel wall thickening, distention, or inflammatory changes. Vascular/Lymphatic: No significant vascular findings are present. No enlarged abdominal or pelvic lymph nodes. Reproductive: The uterus is anteverted and grossly unremarkable. Small anterior body uterine fibroid noted. 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 There is a 6.3 x 7.3 x 7.6 cm cyst with peripheral septation rising from the right ovary similar to prior CT and ultrasound. The right ovary is located anterior to the pelvis and in the midline. The overall appearance of the cyst and location of the ovary has not changed since  the prior CT. This cyst was better evaluated on the ultrasound of 07/07/2017. Please refer to the prior ultrasound report and recommendation. Please note that this cyst can predispose the ovary to  torsion. Ultrasound of the pelvis with Doppler interrogation of the ovaries recommended if there is clinical concern for acute ovarian torsion. Other: None Musculoskeletal: No acute or significant osseous findings. IMPRESSION: No significant interval change in the size or appearance of the right ovarian cyst. If there is clinical concern for torsion, further evaluation with pelvic ultrasound is recommended (see above). No other acute intra-abdominal or pelvic pathology identified. Electronically Signed   By: Anner Crete M.D.   On: 03/03/2018 03:47   US Pelvic Complete W Transvaginal And Torsion R/o  Result Date: 03/03/2018 CLINICAL DATA:  27 y/o  F; 3 days of right-sided abdominal pain. EXAM: TRANSABDOMINAL AND TRANSVAGINAL ULTRASOUND OF PELVIS DOPPLER ULTRASOUND OF OVARIES TECHNIQUE: Both transabdominal and transvaginal ultrasound examinations of the pelvis were performed. Transabdominal technique was performed for global imaging of the pelvis including uterus, ovaries, adnexal regions, and pelvic cul-de-sac. It was necessary to proceed with endovaginal exam following the transabdominal exam to visualize the uterus and adnexa. Color and duplex Doppler ultrasound was utilized to evaluate blood flow to the ovaries. COMPARISON:  03/03/2018 and 07/07/2017 CT abdomen and pelvis. 07/07/2017 pelvic ultrasound. FINDINGS: Uterus Measurements: 6.4 x 3.3 x 4.0 cm. 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 Endometrium Thickness: 3.4 mm.  No focal abnormality visualized. Right ovary Measurements: Cyst with single thin septation measuring 7.8 x 5.8 x 7.6 cm. Ovarian tissue surrounds the margin of the cyst eccentrically. Left ovary Measurements: 2.2 x 1.4 x 1.3 cm. Normal appearance/no adnexal mass. Pulsed Doppler evaluation of both ovaries demonstrates normal low-resistance arterial and venous waveforms in the left ovary. No appreciable flow identified in the right ovarian tissue on color and spectral Doppler.  Other findings Moderate volume of simple peritoneal fluid. IMPRESSION: 1. No appreciable flow within the ovarian tissue at the margin of the right adnexal cyst. Ovarian torsion is possible, however, the ultrasound tech noted suboptimal Doppler evaluation, reimaging may be warranted in the appropriate clinical setting. 2. 7.8 cm right ovarian cyst is stable in size. Since these may be difficult to assess completely with [place], further evaluation of simple-appearing cysts >7 cm with MRI or surgical evaluation is recommended according to the Society of Radiologists in Ultrasound 2010 Consensus Conference Statement (D Clovis Riley et al. Management of Asymptomatic Ovarian and other Adnexal Cysts Imaged at [place]: Society of Radiologists in [place] Statement 2010. Radiology 256 (Sept 2010): 093-267.). These results will be called to the ordering clinician or representative by the Radiologist Assistant, and communication documented in the PACS or zVision Dashboard. Electronically Signed   By: Kristine Garbe M.D.   On: 03/03/2018 04:34     Discharge Exam: Blood pressure 97/61, pulse (!) 55, temperature 98.8 F (37.1 C), resp. rate 16, height 5\' 7"  (1.702 m), weight 237 lb (107.5 kg), last menstrual period 02/10/2018, SpO2 98 %. General appearance: alert and no distress  Resp: clear to auscultation bilaterally  Cardio: regular rate and rhythm  GI: soft, non-tender; bowel sounds normal; no masses, no organomegaly.  Incisions: C/D/I, no erythema, no drainage noted Pelvic: scant blood on pad  Extremities: extremities normal, atraumatic, no cyanosis or edema and Homans sign is negative, no sign of DVT  Discharged Condition: Stable    Discharge disposition: 01-Home or Self Care       Discharge Instructions  Discharge patient   Complete by:  As directed    Discharge disposition:  01-Home or Self Care   Discharge patient date:  03/03/2018     Allergies as of 03/03/2018       Reactions   Mushroom Extract Complex Swelling, Other (See Comments)   Lips swell and patient gets dizzy   Penicillins Other (See Comments)   Was told from childhood: Has patient had a PCN reaction causing immediate rash, facial/tongue/throat swelling, SOB or lightheadedness with hypotension: Unknown Has patient had a PCN reaction causing severe rash involving mucus membranes or skin necrosis: Unknown Has patient had a PCN reaction that required hospitalization: Unknown Has patient had a PCN reaction occurring within the last 10 years: No If all of the above answers are "NO", then may proceed with Cephalosporin use.      Medication List    STOP taking these medications   benzonatate 100 MG capsule Commonly known as:  TESSALON   chlorpheniramine-HYDROcodone 10-8 MG/5ML Suer Commonly known as:  TUSSIONEX PENNKINETIC ER   ondansetron 4 MG disintegrating tablet Commonly known as:  ZOFRAN ODT     TAKE these medications   docusate sodium 100 MG capsule Commonly known as:  COLACE Take 1 capsule (100 mg total) by mouth 2 (two) times daily as needed for mild constipation or moderate constipation.   ibuprofen 800 MG tablet Commonly known as:  ADVIL,MOTRIN Take 1 tablet (800 mg total) by mouth every 8 (eight) hours as needed for moderate pain or cramping. What changed:    when to take this  reasons to take this   ipratropium 0.06 % nasal spray Commonly known as:  ATROVENT Place 2 sprays into both nostrils 4 (four) times daily.   oxyCODONE-acetaminophen 5-325 MG tablet Commonly known as:  PERCOCET/ROXICET Take 1 tablet by mouth every 4 (four) hours as needed for severe pain.       [place] [facility] In 2 weeks.   Specialty:  Obstetrics and Gynecology Why:  You will be called with appointment time and date for postoperative appointment. Call clinic/come to MAU for any concerning issues Contact information: [place] [phone]          Signed:  Verita Schneiders, MD, [place], [facility]

## 2018-03-03 NOTE — Interval H&P Note (Signed)
History and Physical Interval Note 03/03/2018 10:33 AM  Brandi Andrews has presented today for surgery with the diagnosis of Torsed Right Ovary  The various methods of treatment have been discussed with the patient and family. After consideration of risks, benefits and other options for treatment, the patient has consented to [place] With Removal of Right Ovarian Cyst, RIGHT OVARIAN CYSTECTOMY as a surgical intervention.  The patient's history has been reviewed, patient examined, no change in status, stable for surgery.  I have reviewed the patient's chart and labs.  Questions were answered to the patient's satisfaction.      Verita Schneiders, MD, [place] for [employer], [place]

## 2018-03-03 NOTE — H&P (Signed)
History   CSN: [id number]  Arrival date and time: 03/02/18 2237   First Provider Initiated Contact with Patient 03/02/18 2323        Chief Complaint  Patient presents with  . Abdominal Pain  . Nausea   HPI Brandi Andrews is a [name] non pregnant female who presents with right sided lower abdominal pain. She states the pain has been ongoing for 3 days and she rates the pain a 10/10. She tried ibuprofen with no relief. She also reports nausea and vomiting today. Denies any diarrhea.           OB History    Gravida  1   Para      Term      Preterm      AB  1   Living  0     SAB  1   TAB      Ectopic      Multiple      Live Births  0        Obstetric Comments  G1: 2012 early SAB            Past Medical History:  Diagnosis Date  . Obesity   . Ovarian cyst          Past Surgical History:  Procedure Laterality Date  . EYE SURGERY           Family History  Problem Relation Age of Onset  . Diabetes Mother   . Breast cancer Mother 54    Social History        Tobacco Use  . Smoking status: Current Some Day Smoker    Packs/day: 0.10    Years: 4.00    Pack years: 0.40    Types: Cigarettes  . Smokeless tobacco: Never Used  Substance Use Topics  . Alcohol use: Not Currently  . Drug use: No    Allergies:       Allergies  Allergen Reactions  . Mushroom Extract Complex Swelling and Other (See Comments)    Lips swell and patient gets dizzy  . Penicillins Other (See Comments)    Was told from childhood: Has patient had a PCN reaction causing immediate rash, facial/tongue/throat swelling, SOB or lightheadedness with hypotension: Unknown Has patient had a PCN reaction causing severe rash involving mucus membranes or skin necrosis: Unknown Has patient had a PCN reaction that required hospitalization: Unknown Has patient had a PCN reaction occurring within the last 10 years: No If all of the above  answers are "NO", then may proceed with Cephalosporin use.            Medications Prior to Admission  Medication Sig Dispense Refill Last Dose  . acetaminophen (TYLENOL) 500 MG tablet Take 500-1,000 mg every 6 (six) hours as needed by mouth (for pain).    Taking  . benzonatate (TESSALON) 100 MG capsule Take 1 capsule (100 mg total) by mouth every 8 (eight) hours. 21 capsule 0   . chlorpheniramine-HYDROcodone (TUSSIONEX PENNKINETIC ER) 10-8 MG/5ML SUER Take 5 mLs by mouth at bedtime as needed for cough. 60 mL 0   . ibuprofen (ADVIL,MOTRIN) 800 MG tablet Take 1 tablet (800 mg total) by mouth 3 (three) times daily. 21 tablet 0 Taking  . ipratropium (ATROVENT) 0.06 % nasal spray Place 2 sprays into both nostrils 4 (four) times daily. 15 mL 0   . naproxen sodium (ALEVE) 220 MG tablet Take 220-440 mg 2 (two) times daily as needed by mouth (for  pain).   Taking  . ondansetron (ZOFRAN ODT) 4 MG disintegrating tablet Take 1 tablet (4 mg total) by mouth every 8 (eight) hours as needed for nausea or vomiting. 15 tablet 0     Review of Systems  Constitutional: Negative.  Negative for fatigue and fever.  HENT: Negative.   Respiratory: Negative.  Negative for shortness of breath.   Cardiovascular: Negative.  Negative for chest pain.  Gastrointestinal: Positive for abdominal pain, nausea and vomiting. Negative for constipation and diarrhea.  Genitourinary: Negative.  Negative for dysuria, vaginal bleeding and vaginal discharge.  Neurological: Negative.  Negative for dizziness and headaches.   Physical Exam   Blood pressure 134/78, pulse 81, temperature 99 F (37.2 C), temperature source Oral, resp. rate 20, height 5\' 7"  (1.702 m), weight 237 lb (107.5 kg), last menstrual period 02/10/2018, SpO2 99 %.  Physical Exam  Nursing note and vitals reviewed. Constitutional: She is oriented to person, place, and time. She appears well-developed and well-nourished. No distress.  HENT:  Head:  Normocephalic.  Eyes: Pupils are equal, round, and reactive to light.  Cardiovascular: Normal rate, regular rhythm and normal heart sounds.  Respiratory: Effort normal and breath sounds normal. No respiratory distress.  GI: Soft. Bowel sounds are normal. She exhibits no distension. There is tenderness in the right lower quadrant. There is rebound and guarding.  Neurological: She is alert and oriented to person, place, and time.  Skin: Skin is warm and dry.  Psychiatric: She has a normal mood and affect. Her behavior is normal. Judgment and thought content normal.    MAU Course  Procedures LabResultsLast24Hours  Results for orders placed or performed during the hospital encounter of 03/02/18 (from the past 24 hour(s))  Urinalysis, Routine w reflex microscopic     Status: Abnormal   Collection Time: 03/02/18 11:04 PM  Result Value Ref Range   Color, Urine YELLOW YELLOW   APPearance HAZY (A) CLEAR   Specific Gravity, Urine 1.016 1.005 - 1.030   pH 6.0 5.0 - 8.0   Glucose, UA NEGATIVE NEGATIVE mg/dL   Hgb urine dipstick LARGE (A) NEGATIVE   Bilirubin Urine NEGATIVE NEGATIVE   Ketones, ur NEGATIVE NEGATIVE mg/dL   Protein, ur NEGATIVE NEGATIVE mg/dL   Nitrite NEGATIVE NEGATIVE   Leukocytes, UA TRACE (A) NEGATIVE   RBC / HPF 0-5 0 - 5 RBC/hpf   WBC, UA 0-5 0 - 5 WBC/hpf   Bacteria, UA RARE (A) NONE SEEN   Squamous Epithelial / LPF 6-10 0 - 5   Mucus PRESENT   Pregnancy, urine POC     Status: None   Collection Time: 03/02/18 11:13 PM  Result Value Ref Range   Preg Test, Ur NEGATIVE NEGATIVE  CBC     Status: Abnormal   Collection Time: 03/02/18 11:39 PM  Result Value Ref Range   WBC 11.3 (H) 4.0 - 10.5 K/uL   RBC 4.26 3.87 - 5.11 MIL/uL   Hemoglobin 11.8 (L) 12.0 - 15.0 g/dL   HCT 35.0 (L) 36.0 - 46.0 %   MCV 82.2 78.0 - 100.0 fL   MCH 27.7 26.0 - 34.0 pg   MCHC 33.7 30.0 - 36.0 g/dL   RDW 14.4 11.5 - 15.5 %   Platelets 350 150 - 400 K/uL   Comprehensive metabolic panel     Status: Abnormal   Collection Time: 03/02/18 11:39 PM  Result Value Ref Range   Sodium 140 135 - 145 mmol/L   Potassium 3.7 3.5 - 5.1 mmol/L  Chloride 109 101 - 111 mmol/L   CO2 21 (L) 22 - 32 mmol/L   Glucose, Bld 121 (H) 65 - 99 mg/dL   BUN 10 6 - 20 mg/dL   Creatinine, Ser 0.74 0.44 - 1.00 mg/dL   Calcium 9.0 8.9 - 10.3 mg/dL   Total Protein 7.9 6.5 - 8.1 g/dL   Albumin 4.2 3.5 - 5.0 g/dL   AST 16 15 - 41 U/L   ALT 12 (L) 14 - 54 U/L   Alkaline Phosphatase 58 38 - 126 U/L   Total Bilirubin 0.6 0.3 - 1.2 mg/dL   GFR calc non Af Amer >60 >60 mL/min   GFR calc Af Amer >60 >60 mL/min   Anion gap 10 5 - 15     Ct Abdomen Pelvis W Contrast  Result Date: 03/03/2018 CLINICAL DATA:  27 year old female with abdominal pain. History of ovarian cysts. EXAM: CT ABDOMEN AND PELVIS WITH CONTRAST TECHNIQUE: Multidetector CT imaging of the abdomen and pelvis was performed using the standard protocol following bolus administration of intravenous contrast. CONTRAST:  170mL ISOVUE-300 IOPAMIDOL (ISOVUE-300) INJECTION 61% COMPARISON:  Abdominal CT dated 07/07/2017 and pelvic ultrasound dated 07/07/2017 FINDINGS: Lower chest: The visualized lung bases are clear. No intra-abdominal free air. Small free fluid within the pelvis. Hepatobiliary: No focal liver abnormality is seen. No gallstones, gallbladder wall thickening, or biliary dilatation. Pancreas: Unremarkable. No pancreatic ductal dilatation or surrounding inflammatory changes. Spleen: Normal in size without focal abnormality. Adrenals/Urinary Tract: Adrenal glands are unremarkable. Kidneys are normal, without renal calculi, focal lesion, or hydronephrosis. Bladder is unremarkable. Stomach/Bowel: Stomach is within normal limits. Appendix appears normal. No evidence of bowel wall thickening, distention, or inflammatory changes. Vascular/Lymphatic: No significant vascular findings are present. No  enlarged abdominal or pelvic lymph nodes. Reproductive: The uterus is anteverted and grossly unremarkable. Small anterior body uterine fibroid noted. 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 There is a 6.3 x 7.3 x 7.6 cm cyst with peripheral septation rising from the right ovary similar to prior CT and ultrasound. The right ovary is located anterior to the pelvis and in the midline. The overall appearance of the cyst and location of the ovary has not changed since the prior CT. This cyst was better evaluated on the ultrasound of 07/07/2017. Please refer to the prior ultrasound report and recommendation. Please note that this cyst can predispose the ovary to torsion. Ultrasound of the pelvis with Doppler interrogation of the ovaries recommended if there is clinical concern for acute ovarian torsion. Other: None Musculoskeletal: No acute or significant osseous findings. IMPRESSION: No significant interval change in the size or appearance of the right ovarian cyst. If there is clinical concern for torsion, further evaluation with pelvic ultrasound is recommended (see above). No other acute intra-abdominal or pelvic pathology identified. Electronically Signed   By: Anner Crete M.D.   On: 03/03/2018 03:47   US Pelvic Complete W Transvaginal And Torsion R/o  Result Date: 03/03/2018 CLINICAL DATA:  27 y/o  F; 3 days of right-sided abdominal pain. EXAM: TRANSABDOMINAL AND TRANSVAGINAL ULTRASOUND OF PELVIS DOPPLER ULTRASOUND OF OVARIES TECHNIQUE: Both transabdominal and transvaginal ultrasound examinations of the pelvis were performed. Transabdominal technique was performed for global imaging of the pelvis including uterus, ovaries, adnexal regions, and pelvic cul-de-sac. It was necessary to proceed with endovaginal exam following the transabdominal exam to visualize the uterus and adnexa. Color and duplex Doppler ultrasound was utilized to evaluate blood flow to the ovaries. COMPARISON:  03/03/2018 and  07/07/2017  CT abdomen and pelvis. 07/07/2017 pelvic ultrasound. FINDINGS: Uterus Measurements: 6.4 x 3.3 x 4.0 cm. 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 Endometrium Thickness: 3.4 mm.  No focal abnormality visualized. Right ovary Measurements: Cyst with single thin septation measuring 7.8 x 5.8 x 7.6 cm. Ovarian tissue surrounds the margin of the cyst eccentrically. Left ovary Measurements: 2.2 x 1.4 x 1.3 cm. Normal appearance/no adnexal mass. Pulsed Doppler evaluation of both ovaries demonstrates normal low-resistance arterial and venous waveforms in the left ovary. No appreciable flow identified in the right ovarian tissue on color and spectral Doppler. Other findings Moderate volume of simple peritoneal fluid. IMPRESSION: 1. No appreciable flow within the ovarian tissue at the margin of the right adnexal cyst. Ovarian torsion is possible, however, the ultrasound tech noted suboptimal Doppler evaluation, reimaging may be warranted in the appropriate clinical setting. 2. 7.8 cm right ovarian cyst is stable in size. Since these may be difficult to assess completely with [place], further evaluation of simple-appearing cysts >7 cm with MRI or surgical evaluation is recommended according to the Society of Radiologists in Ultrasound 2010 Consensus Conference Statement (D Clovis Riley et al. Management of Asymptomatic Ovarian and other Adnexal Cysts Imaged at [place]: Society of Radiologists in [place] Statement 2010. Radiology 256 (Sept 2010): 735-329.). These results will be called to the ordering clinician or representative by the Radiologist Assistant, and communication documented in the PACS or zVision Dashboard. Electronically Signed   By: Kristine Garbe M.D.   On: 03/03/2018 04:34    MDM UA, UPT CBC, CMP Toradol IM Dilaudid IV Saline Lock CT Abdomen and Pelvis with contrast US Pelvis Complete with transvaginal and torsion r/o  Consulted with Dr. Ilda Basset regarding  patient's presentation and results- will come see patient.   Assessment and Plan   1. Ovarian torsion   2. Abdominal pain    -Admit patient to [facility] for pain management -Will proceed to OR in AM -Orders placed by Dr. Ilda Basset.  Wende Mott CNM 03/02/2018, 11:23 PM

## 2018-03-03 NOTE — Progress Notes (Signed)
Transporting pt to OR.

## 2018-03-03 NOTE — Anesthesia Postprocedure Evaluation (Signed)
Anesthesia Post Note  Patient: Brandi Andrews  Procedure(s) Performed: LAPAROSCOPIC SALPINGO OOPHORECTOMY (Right Abdomen)     Patient location during evaluation: PACU Anesthesia Type: General Level of consciousness: awake and alert, oriented and patient cooperative Pain management: pain level controlled Vital Signs Assessment: post-procedure vital signs reviewed and stable Respiratory status: spontaneous breathing, nonlabored ventilation and respiratory function stable Cardiovascular status: blood pressure returned to baseline and stable Postop Assessment: no apparent nausea or vomiting Anesthetic complications: no    Last Vitals:  Vitals:   03/03/18 1254 03/03/18 1315  BP: (!) 105/55 109/66  Pulse: (!) 57 (!) 57  Resp:  12  Temp: 36.7 C   SpO2: 99% 100%    Last Pain:  Vitals:   03/03/18 1315  TempSrc:   PainSc: Asleep   Pain Goal: Patients Stated Pain Goal: 2 (03/03/18 1000)               Ghislaine Harcum,E. Leviticus Harton

## 2018-03-03 NOTE — Op Note (Signed)
Brandi Andrews PROCEDURE DATE: 03/03/2018  PREOPERATIVE DIAGNOSES: Torsed right ovary and pedicle POSTOPERATIVE DIAGNOSES: The same PROCEDURE: Laparoscopic right salpingoophorectomy SURGEON:  Dr. Verita Schneiders ANESTHESIOLOGIST: Dr. Annye Asa  INDICATIONS: [name] with aforementioned preoperative diagnoses here today for surgical management.   Risks of surgery were discussed with the patient including but not limited to: bleeding which may require transfusion or reoperation; infection which may require antibiotics; injury to bowel, bladder, ureters or other surrounding organs; need for additional procedures including laparotomy; thromboembolic phenomenon, incisional problems and other postoperative/anesthesia complications. Written informed consent was obtained.    FINDINGS:  Small uterus, right adnexa with large ovary (about 10 cm) which was torsed three times around the utero-ovarian pedicle.  Did not look viable at all.  Dark, bloody fluid within ovary.  Normal left adnexa .  No evidence of endometriosis, adhesions or any other abdominal/pelvic abnormality.  Normal upper abdomen.  ANESTHESIA:    General INTRAVENOUS FLUIDS: 1300 ml ESTIMATED BLOOD LOSS: 50 ml SPECIMENS:  rightovary and fallopian tube COMPLICATIONS: None immediate  PROCEDURE IN DETAIL:  The patient had sequential compression devices applied to her lower extremities while in the preoperative area.  She was then taken to the operating room where general anesthesia was administered and was found to be adequate.  She was placed in the dorsal lithotomy position, and was prepped and draped in a sterile manner.  A Foley catheter was inserted into her bladder and attached to constant drainage and a uterine manipulator was then advanced into the uterus . After an adequate timeout was performed, attention was then turned to the patient's abdomen where a 11-mm skin incision was made in the umbilical fold.  The Optiview 11-mm  trocar and sleeve were then advanced without difficulty with the laparoscope under direct visualization into the abdomen.  The abdomen was then insufflated with carbon dioxide gas.  Adequate pneumoperitoneum was obtained.  A survey of the patient's pelvis and abdomen revealed the findings above and the decision was made to proceed with right salpingo-oophorectomy. Two 5-mm left lower quadrant ports were then placed under direct visualization.  On the right side, Harmonic device was used to make a hole into the large ovary and dark, red brown fluid was suctioned with the [place].  The right uteroovarian ligament was then clamped and transected with the Harmonic device.  The right infundibulopelvic ligament was also clamped and transected allowing for salpingooophorectomy.  Excellent hemostasis was noted. The specimen was then removed from the abdomen through the 11-mm port which was widened to allow direct removal.  The small Alexis retractor was also placed and other retractors were also used to extract the large specimen under direct visualization.  The operative site was surveyed, and it was found to be hemostatic.  No intraoperative injury to other surrounding organs was noted.  The abdomen was desufflated and all instruments were then removed from the patient's abdomen. The fascial incision of the umbilicus was closed with a 0 Vicryl running stitch.  All skin incisions were closed with 4-0 Monocryl subcuticular stitches and Dermabond.   The patient will be discharged to home as per PACU criteria.  Routine postoperative instructions given.  She was prescribed Percocet, Ibuprofen and Colace.  She will follow up in the clinic in 2 -3 weeks for postoperative evaluation.    Verita Schneiders, MD, [place] for [employer], [place]

## 2018-03-03 NOTE — Anesthesia Procedure Notes (Signed)
Procedure Name: Intubation Date/Time: 03/03/2018 10:45 AM Performed by: Annye Asa, MD Pre-anesthesia Checklist: Patient identified, Patient being monitored, Timeout performed, Emergency Drugs available and Suction available Patient Re-evaluated:Patient Re-evaluated prior to induction Oxygen Delivery Method: Circle System Utilized Preoxygenation: Pre-oxygenation with 100% oxygen Induction Type: IV induction Ventilation: Mask ventilation without difficulty Laryngoscope Size: 2 and Miller Grade View: Grade II Tube type: Oral Tube size: 7.0 mm Number of attempts: 1 Airway Equipment and Method: stylet Placement Confirmation: ETT inserted through vocal cords under direct vision,  positive ETCO2 and breath sounds checked- equal and bilateral Secured at: 21 cm Tube secured with: Tape Dental Injury: Teeth and Oropharynx as per pre-operative assessment

## 2018-03-04 ENCOUNTER — Encounter (HOSPITAL_COMMUNITY): Payer: Self-pay | Admitting: Obstetrics & Gynecology

## 2018-03-10 ENCOUNTER — Telehealth: Payer: Self-pay

## 2018-03-10 NOTE — Telephone Encounter (Signed)
Pt called requesting test results and f/u.

## 2018-03-11 NOTE — Telephone Encounter (Signed)
Diagnosis Ovary and fallopian tube, right - BENIGN SEROUS CYST - HEMORRHAGE AND VASCULAR CONGESTION CONSISTENT WITH TORSION - NO MALIGNANCY IDENTIFIED  Benign pathology. Results were released to [place].  Follow up for postoperative visit as scheduled on 03/18/18.

## 2018-03-11 NOTE — Telephone Encounter (Signed)
Please advise. Surgical path looks ok, but patient would like to know follow up plan.

## 2018-03-18 ENCOUNTER — Encounter: Payer: Self-pay | Admitting: Obstetrics & Gynecology

## 2018-03-18 ENCOUNTER — Ambulatory Visit (INDEPENDENT_AMBULATORY_CARE_PROVIDER_SITE_OTHER): Payer: Self-pay | Admitting: Obstetrics & Gynecology

## 2018-03-18 VITALS — BP 119/67 | HR 79 | Ht 67.0 in | Wt 236.0 lb

## 2018-03-18 DIAGNOSIS — G8918 Other acute postprocedural pain: Secondary | ICD-10-CM

## 2018-03-18 DIAGNOSIS — Z029 Encounter for administrative examinations, unspecified: Secondary | ICD-10-CM

## 2018-03-18 DIAGNOSIS — Z09 Encounter for follow-up examination after completed treatment for conditions other than malignant neoplasm: Secondary | ICD-10-CM

## 2018-03-18 MED ORDER — OXYCODONE-ACETAMINOPHEN 5-325 MG PO TABS: 1.0000 | ORAL_TABLET | ORAL | 0 refills | Status: DC | PRN

## 2018-03-18 NOTE — Progress Notes (Signed)
Subjective:     Brandi Andrews is a 27 y.o. female who presents to the clinic 2 weeks status post laparoscopic right salpingoophorectomy for large torsed right adnexa.  Eating a regular diet without difficulty. Bowel movements are normal. Pain is not well controlled.  Medications being used: ibuprofen (OTC), desires stronger medication.  The following portions of the patient's history were reviewed and updated as appropriate: allergies, current medications, past family history, past medical history, past social history, past surgical history and problem list.  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 Repeat pap needed soon.   Review of Systems Pertinent items noted in HPI and remainder of comprehensive ROS otherwise negative.    Objective:    BP 119/67   Pulse 79   Ht 5\' 7"  (1.702 m)   Wt 236 lb (107 kg)   LMP 03/03/2018 (Exact Date)   BMI 36.96 kg/m  General:  alert and no distress  Abdomen: soft, bowel sounds active, non-tender  Incision:   healing well, no drainage, no erythema, no hernia, no seroma, no swelling, no dehiscence, incision well approximated      03/03/2018 Surgical Pathology Ovary and fallopian tube, right - BENIGN SEROUS CYST - HEMORRHAGE AND VASCULAR CONGESTION CONSISTENT WITH TORSION - NO MALIGNANCY IDENTIFIED  Assessment:    Doing well postoperatively. Operative findings again reviewed. Pathology report discussed.    Plan:   1. Continue any current medications. 2. Wound care discussed. 3. Activity restrictions: none 4. Anticipated return to work: now. 5. Follow up for repeat pap smear.  Verita Schneiders, MD, [place] for [employer], [place]

## 2018-03-18 NOTE — Telephone Encounter (Signed)
Patient has follow up appointment today and will discuss follow up care.

## 2018-03-18 NOTE — Patient Instructions (Signed)
Return to clinic for any scheduled appointments or for any gynecologic concerns as needed.   

## 2018-08-25 ENCOUNTER — Encounter (HOSPITAL_COMMUNITY): Payer: Self-pay | Admitting: Emergency Medicine

## 2018-08-25 ENCOUNTER — Ambulatory Visit (HOSPITAL_COMMUNITY)
Admission: EM | Admit: 2018-08-25 | Discharge: 2018-08-25 | Disposition: A | Discharge status: Home / Self Care | Payer: Self-pay | Attending: Family Medicine | Admitting: Family Medicine

## 2018-08-25 DIAGNOSIS — K0889 Other specified disorders of teeth and supporting structures: Secondary | ICD-10-CM

## 2018-08-25 MED ORDER — CLINDAMYCIN HCL 150 MG PO CAPS: 150.0000 mg | ORAL_CAPSULE | Freq: Four times a day (QID) | ORAL | 0 refills | Status: DC

## 2018-08-25 MED ORDER — TRAMADOL HCL 50 MG PO TABS: 50.0000 mg | ORAL_TABLET | Freq: Four times a day (QID) | ORAL | 0 refills | Status: DC | PRN

## 2018-08-25 NOTE — Discharge Instructions (Signed)
We will go ahead and treat you for dental infection and give you some pain medicine until you see your dentist on Friday as scheduled

## 2018-08-25 NOTE — ED Triage Notes (Signed)
PT reports dental pain for 2 days. OTC meds not helping, has dental sppt Friday.

## 2018-08-25 NOTE — ED Provider Notes (Signed)
[facility]    CSN: [id number] Arrival date & time: 08/25/18  0847     History   Chief Complaint Chief Complaint  Patient presents with  . Dental Pain    HPI Brandi Andrews is a 27 y.o. female.    Dental Pain  Location:  Upper Upper teeth location:  8/RU central incisor Quality:  Pulsating and throbbing Severity:  Moderate Onset quality:  Sudden Duration:  2 days Progression:  Worsening Chronicity:  New Context: dental caries and dental fracture   Previous work-up:  Dental exam Relieved by:  Nothing Worsened by:  Pressure and touching Ineffective treatments:  NSAIDs Associated symptoms: no congestion, no difficulty swallowing, no drooling, no facial pain, no facial swelling, no fever, no gum swelling, no headaches, no neck pain, no neck swelling, no oral bleeding, no oral lesions and no trismus   Risk factors: lack of dental care   Risk factors: no smoking     Past Medical History:  Diagnosis Date  . Medical history non-contributory   . Obesity   . Ovarian cyst     Patient Active Problem List   Diagnosis Date Noted  . Noncompliance 03/03/2018  . Torsion of right ovary and ovarian pedicle 03/03/2018  . Atypical squamous cells cannot exclude high grade squamous intraepithelial lesion on cytologic smear of cervix (ASC-H) 03/04/2017  . BMI 40.0-44.9, adult ([place]) 02/26/2017    Past Surgical History:  Procedure Laterality Date  . EYE SURGERY    . LAPAROSCOPIC SALPINGO OOPHERECTOMY Right 03/03/2018   Procedure: LAPAROSCOPIC SALPINGO OOPHORECTOMY;  Surgeon: Osborne Oman, MD;  Location: [facility];  Service: Gynecology;  Laterality: Right;    OB History    Gravida  1   Para      Term      Preterm      AB  1   Living  0     SAB  1   TAB      Ectopic      Multiple      Live Births  0        Obstetric Comments  G1: 2012 early SAB         Home Medications    Prior to Admission medications   Medication Sig Start Date End  Date Taking? Authorizing Provider  ibuprofen (ADVIL,MOTRIN) 800 MG tablet Take 1 tablet (800 mg total) by mouth every 8 (eight) hours as needed for moderate pain or cramping. 03/03/18  Yes Anyanwu, Sallyanne Havers, MD  clindamycin (CLEOCIN) 150 MG capsule Take 1 capsule (150 mg total) by mouth every 6 (six) hours. 08/25/18   Loura Halt A, NP  docusate sodium (COLACE) 100 MG capsule Take 1 capsule (100 mg total) by mouth 2 (two) times daily as needed for mild constipation or moderate constipation. 03/03/18   Anyanwu, Sallyanne Havers, MD  oxyCODONE-acetaminophen (PERCOCET/ROXICET) 5-325 MG tablet Take 1 tablet by mouth every 4 (four) hours as needed for severe pain. 03/18/18   Anyanwu, Sallyanne Havers, MD  traMADol (ULTRAM) 50 MG tablet Take 1 tablet (50 mg total) by mouth every 6 (six) hours as needed. 08/25/18   Orvan July, NP    Family History Family History  Problem Relation Age of Onset  . Diabetes Mother   . Breast cancer Mother 78    Social History Social History   Tobacco Use  . Smoking status: Current Some Day Smoker    Packs/day: 0.10    Years: 4.00    Pack years: 0.40  Types: Cigarettes  . Smokeless tobacco: Never Used  Substance Use Topics  . Alcohol use: Not Currently  . Drug use: No     Allergies   Mushroom extract complex and Penicillins   Review of Systems Review of Systems  Constitutional: Negative for fever.  HENT: Negative for congestion, drooling, facial swelling and mouth sores.   Musculoskeletal: Negative for neck pain.  Neurological: Negative for headaches.     Physical Exam Triage Vital Signs ED Triage Vitals  Enc Vitals Group     BP 08/25/18 0917 118/78     Pulse Rate 08/25/18 0917 90     Resp 08/25/18 0917 18     Temp 08/25/18 0917 99.1 F (37.3 C)     Temp Source 08/25/18 0917 Oral     SpO2 08/25/18 0917 99 %     Weight --      Height --      Head Circumference --      Peak Flow --      Pain Score 08/25/18 0918 6     Pain Loc --      Pain Edu? --       Excl. in [place]? --    No data found.  Updated Vital Signs BP 118/78 (BP Location: Left Arm)   Pulse 90   Temp 99.1 F (37.3 C) (Oral)   Resp 18   LMP 08/04/2018   SpO2 99%   Visual Acuity Right Eye Distance:   Left Eye Distance:   Bilateral Distance:    Right Eye Near:   Left Eye Near:    Bilateral Near:     Physical Exam  Constitutional: She appears well-developed and well-nourished.  Very pleasant. Non toxic or ill appearing.   HENT:  Head: Normocephalic and atraumatic.  Mouth/Throat: No trismus in the jaw.    Broken number 8 with caries. Mild gum swelling   Eyes: Conjunctivae are normal.  Nursing note and vitals reviewed.    UC Treatments / Results  Labs (all labs ordered are listed, but only abnormal results are displayed) Labs Reviewed - No data to display  EKG None  Radiology No results found.  Procedures Procedures (including critical care time)  Medications Ordered in UC Medications - No data to display  Initial Impression / Assessment and Plan / UC Course  I have reviewed the triage vital signs and the nursing notes.  Pertinent labs & imaging results that were available during my care of the patient were reviewed by me and considered in my medical decision making (see chart for details).     Dental fracture and infection We will treat with antibiotics and give her some pain medicine until she can see her dentist on Friday as scheduled Final Clinical Impressions(s) / UC Diagnoses   Final diagnoses:  Pain, dental     Discharge Instructions     We will go ahead and treat you for dental infection and give you some pain medicine until you see your dentist on Friday as scheduled     ED Prescriptions    Medication Sig Dispense Auth. Provider   clindamycin (CLEOCIN) 150 MG capsule Take 1 capsule (150 mg total) by mouth every 6 (six) hours. 28 capsule Wyolene Weimann A, NP   traMADol (ULTRAM) 50 MG tablet Take 1 tablet (50 mg total) by mouth  every 6 (six) hours as needed. 10 tablet Loura Halt A, NP     Controlled Substance Prescriptions  Controlled Substance Registry consulted? Yes, I have  consulted the [place] Controlled Substances Registry for this patient, and feel the risk/benefit ratio today is favorable for proceeding with this prescription for a controlled substance.   Orvan July, NP 08/25/18 [id number]

## 2018-08-26 ENCOUNTER — Ambulatory Visit: Payer: Self-pay | Admitting: Obstetrics & Gynecology

## 2018-08-26 ENCOUNTER — Encounter: Payer: Self-pay | Admitting: *Deleted

## 2018-08-26 NOTE — Progress Notes (Signed)
Irene Pap did not keep her scheduled appointment for annual. Per discussion with Dr.Anyanwu does not need to be called, may reschedule if she calls.

## 2018-09-09 ENCOUNTER — Other Ambulatory Visit (HOSPITAL_COMMUNITY)
Admission: RE | Admit: 2018-09-09 | Discharge: 2018-09-09 | Disposition: A | Discharge status: Home / Self Care | Payer: 59 | Source: Ambulatory Visit | Attending: Obstetrics & Gynecology | Admitting: Obstetrics & Gynecology

## 2018-09-09 ENCOUNTER — Encounter: Payer: Self-pay | Admitting: Obstetrics & Gynecology

## 2018-09-09 ENCOUNTER — Ambulatory Visit (INDEPENDENT_AMBULATORY_CARE_PROVIDER_SITE_OTHER): Payer: 59 | Admitting: Obstetrics & Gynecology

## 2018-09-09 VITALS — BP 119/76 | HR 77 | Ht 67.0 in | Wt 228.9 lb

## 2018-09-09 DIAGNOSIS — Z01419 Encounter for gynecological examination (general) (routine) without abnormal findings: Secondary | ICD-10-CM | POA: Insufficient documentation

## 2018-09-09 DIAGNOSIS — Z8742 Personal history of other diseases of the female genital tract: Secondary | ICD-10-CM

## 2018-09-09 DIAGNOSIS — B3731 Acute candidiasis of vulva and vagina: Secondary | ICD-10-CM

## 2018-09-09 DIAGNOSIS — R102 Pelvic and perineal pain: Secondary | ICD-10-CM

## 2018-09-09 DIAGNOSIS — B373 Candidiasis of vulva and vagina: Secondary | ICD-10-CM

## 2018-09-09 NOTE — Progress Notes (Addendum)
GYNECOLOGY ANNUAL PREVENTATIVE CARE ENCOUNTER NOTE  Subjective:   Brandi Andrews is a 27 y.o. G59P0010 female here for a routine annual gynecologic exam.  Current complaints: some lower pelvic pain, reminiscent of there pain prior to undergoing RSO in May 2019 for torsed 10 cm cyst. No nausea, vomiting. Pain is intermittent, like a pulling sensations at times. Mild-moderate in intensity. Wants to make sure her left ovary is fine.   Denies abnormal vaginal bleeding, discharge, pelvic pain, problems with intercourse or other gynecologic concerns.    Gynecologic History Patient's last menstrual period was 08/24/2018 (within days). Contraception: coitus interruptus, declines any other methods Last Pap: 02/2017. Results were: ASC-H. Benign colposcopy.  Obstetric History OB History  Gravida Para Term Preterm AB Living  1       1 0  SAB TAB Ectopic Multiple Live Births  1       0    # Outcome Date GA Lbr Len/2nd Weight Sex Delivery Anes PTL Lv  1 SAB             Obstetric Comments  G1: 2012 early SAB    Past Medical History:  Diagnosis Date  . Medical history non-contributory   . Obesity   . Ovarian cyst     Past Surgical History:  Procedure Laterality Date  . EYE SURGERY    . LAPAROSCOPIC SALPINGO OOPHERECTOMY Right 03/03/2018   Procedure: LAPAROSCOPIC SALPINGO OOPHORECTOMY;  Surgeon: Osborne Oman, MD;  Location: [facility];  Service: Gynecology;  Laterality: Right;    No current outpatient medications on file prior to visit.   No current facility-administered medications on file prior to visit.     Allergies  Allergen Reactions  . Mushroom Extract Complex Swelling and Other (See Comments)    Lips swell and patient gets dizzy  . Penicillins Other (See Comments)    Was told from childhood: Has patient had a PCN reaction causing immediate rash, facial/tongue/throat swelling, SOB or lightheadedness with hypotension: Unknown Has patient had a PCN reaction causing severe  rash involving mucus membranes or skin necrosis: Unknown Has patient had a PCN reaction that required hospitalization: Unknown Has patient had a PCN reaction occurring within the last 10 years: No If all of the above answers are "NO", then may proceed with Cephalosporin use.     Social History:  reports that she has been smoking cigarettes. She has a 0.40 pack-year smoking history. She has never used smokeless tobacco. She reports that she drank alcohol. She reports that she does not use drugs.  Family History  Problem Relation Age of Onset  . Diabetes Mother   . Breast cancer Mother 32    The following portions of the patient's history were reviewed and updated as appropriate: allergies, current medications, past family history, past medical history, past social history, past surgical history and problem list.  Review of Systems Pertinent items noted in HPI and remainder of comprehensive ROS otherwise negative.   Objective:  BP 119/76   Pulse 77   Ht 5\' 7"  (1.702 m)   Wt 228 lb 14.4 oz (103.8 kg)   LMP 08/24/2018 (Within Days)   BMI 35.85 kg/m  CONSTITUTIONAL: Well-developed, well-nourished female in no acute distress.  HENT:  Normocephalic, atraumatic, External right and left ear normal. Oropharynx is clear and moist EYES: Conjunctivae and EOM are normal. Pupils are equal, round, and reactive to light. No scleral icterus.  NECK: Normal range of motion, supple, no masses.  Normal thyroid.  SKIN:  Skin is warm and dry. No rash noted. Not diaphoretic. No erythema. No pallor. MUSCULOSKELETAL: Normal range of motion. No tenderness.  No cyanosis, clubbing, or edema.  2+ distal pulses. NEUROLOGIC: Alert and oriented to person, place, and time. Normal reflexes, muscle tone coordination. No cranial nerve deficit noted. PSYCHIATRIC: Normal mood and affect. Normal behavior. Normal judgment and thought content. CARDIOVASCULAR: Normal heart rate noted, regular rhythm RESPIRATORY: Clear to  auscultation bilaterally. Effort and breath sounds normal, no problems with respiration noted. BREASTS: Symmetric in size. No masses, skin changes, nipple drainage, or lymphadenopathy. ABDOMEN: Soft, normal bowel sounds, no distention noted.  No tenderness, rebound or guarding.  PELVIC: Normal appearing external genitalia; normal appearing vaginal mucosa and cervix.  Scant white discharge noted, testing sample and  Pap smear obtained.  Normal uterine size, no other palpable masses, no uterine or left adnexal tenderness, ?right sided tenderness.    Assessment and Plan:  1. Pelvic pain 2. History of ovarian cyst Pain could be due to adhesive tissues s/p recent surgery or other etiology. Ultrasound ordered, will follow up results and manage accordingly.   - US PELVIC COMPLETE WITH TRANSVAGINAL; Future  3. Well woman exam with routine gynecological exam - Cytology - PAP - Hepatitis B surface antigen - Hepatitis C antibody - RPR - HIV Antibody (routine testing w rflx) - Cervicovaginal ancillary only Will follow up results of pap smear and labs and manage accordingly. Routine preventative health maintenance measures emphasized. Please refer to After Visit Summary for other counseling recommendations.    Verita Schneiders, MD, [place] for [employer], [place]

## 2018-09-09 NOTE — Patient Instructions (Signed)
Preventive Care 18-39 Years, Female Preventive care refers to lifestyle choices and visits with your health care provider that can promote health and wellness. What does preventive care include?  A yearly physical exam. This is also called an annual well check.  Dental exams once or twice a year.  Routine eye exams. Ask your health care provider how often you should have your eyes checked.  Personal lifestyle choices, including: ? Daily care of your teeth and gums. ? Regular physical activity. ? Eating a healthy diet. ? Avoiding tobacco and drug use. ? Limiting alcohol use. ? Practicing safe sex. ? Taking vitamin and mineral supplements as recommended by your health care provider. What happens during an annual well check? The services and screenings done by your health care provider during your annual well check will depend on your age, overall health, lifestyle risk factors, and family history of disease. Counseling Your health care provider may ask you questions about your:  Alcohol use.  Tobacco use.  Drug use.  Emotional well-being.  Home and relationship well-being.  Sexual activity.  Eating habits.  Work and work Statistician.  Method of birth control.  Menstrual cycle.  Pregnancy history.  Screening You may have the following tests or measurements:  Height, weight, and BMI.  Diabetes screening. This is done by checking your blood sugar (glucose) after you have not eaten for a while (fasting).  Blood pressure.  Lipid and cholesterol levels. These may be checked every 5 years starting at age 66.  Skin check.  Hepatitis C blood test.  Hepatitis B blood test.  Sexually transmitted disease (STD) testing.  BRCA-related cancer screening. This may be done if you have a family history of breast, ovarian, tubal, or peritoneal cancers.  Pelvic exam and Pap test. This may be done every 3 years starting at age 40. Starting at age 59, this may be done every 5  years if you have a Pap test in combination with an HPV test.  Discuss your test results, treatment options, and if necessary, the need for more tests with your health care provider. Vaccines Your health care provider may recommend certain vaccines, such as:  Influenza vaccine. This is recommended every year.  Tetanus, diphtheria, and acellular pertussis (Tdap, Td) vaccine. You may need a Td booster every 10 years.  Varicella vaccine. You may need this if you have not been vaccinated.  HPV vaccine. If you are 69 or younger, you may need three doses over 6 months.  Measles, mumps, and rubella (MMR) vaccine. You may need at least one dose of MMR. You may also need a second dose.  Pneumococcal 13-valent conjugate (PCV13) vaccine. You may need this if you have certain conditions and were not previously vaccinated.  Pneumococcal polysaccharide (PPSV23) vaccine. You may need one or two doses if you smoke cigarettes or if you have certain conditions.  Meningococcal vaccine. One dose is recommended if you are age 27-21 years and a first-year college student living in a residence hall, or if you have one of several medical conditions. You may also need additional booster doses.  Hepatitis A vaccine. You may need this if you have certain conditions or if you travel or work in places where you may be exposed to hepatitis A.  Hepatitis B vaccine. You may need this if you have certain conditions or if you travel or work in places where you may be exposed to hepatitis B.  Haemophilus influenzae type b (Hib) vaccine. You may need this if  you have certain risk factors.  Talk to your health care provider about which screenings and vaccines you need and how often you need them. This information is not intended to replace advice given to you by your health care provider. Make sure you discuss any questions you have with your health care provider. Document Released: 12/10/2001 Document Revised: 07/03/2016  Document Reviewed: 08/15/2015 Elsevier Interactive Patient Education  [employer].

## 2018-09-10 LAB — CERVICOVAGINAL ANCILLARY ONLY
Bacterial vaginitis: NEGATIVE
Candida vaginitis: POSITIVE — AB
Chlamydia: NEGATIVE
Neisseria Gonorrhea: NEGATIVE
Trichomonas: NEGATIVE

## 2018-09-10 LAB — HIV ANTIBODY (ROUTINE TESTING W REFLEX): HIV Screen 4th Generation wRfx: NONREACTIVE

## 2018-09-10 LAB — HEPATITIS B SURFACE ANTIGEN: Hepatitis B Surface Ag: NEGATIVE

## 2018-09-10 LAB — HEPATITIS C ANTIBODY: Hep C Virus Ab: 0.1 s/co ratio (ref 0.0–0.9)

## 2018-09-10 LAB — RPR: RPR: NONREACTIVE

## 2018-09-11 ENCOUNTER — Ambulatory Visit (HOSPITAL_COMMUNITY): Payer: 59

## 2018-09-11 MED ORDER — FLUCONAZOLE 150 MG PO TABS: 150.0000 mg | ORAL_TABLET | Freq: Once | ORAL | 3 refills | Status: AC

## 2018-09-11 NOTE — Addendum Note (Signed)
Addended by: Verita Schneiders A on: 09/11/2018 08:32 AM   Modules accepted: Orders

## 2018-09-14 ENCOUNTER — Telehealth: Payer: Self-pay

## 2018-09-14 ENCOUNTER — Telehealth: Payer: Self-pay | Admitting: *Deleted

## 2018-09-14 LAB — CYTOLOGY - PAP

## 2018-09-14 NOTE — Telephone Encounter (Signed)
-----   Message from Osborne Oman, MD sent at 09/14/2018 11:58 AM EST ----- Please schedule patient for colposcopy for LGSIL pap done on 09/09/18. Please call to inform patient of results and need for appointment.

## 2018-09-14 NOTE — Telephone Encounter (Signed)
Called pt to inform her of her pap smear and of her need for a colposcopy.  Advised pt that the front office staff would be contacting her to schedule an appointment.  Pt verbalized understanding.

## 2018-09-15 ENCOUNTER — Telehealth: Payer: Self-pay | Admitting: *Deleted

## 2018-09-15 NOTE — Telephone Encounter (Signed)
Returned pt's call regarding her results.  Pt did not pick up.  Voicemail left informing pt that she was being contacted in regards to her call and that if she continued to have questions or concerns she could call the office back.

## 2018-09-17 ENCOUNTER — Ambulatory Visit (HOSPITAL_COMMUNITY): Admission: RE | Admit: 2018-09-17 | Payer: 59 | Source: Ambulatory Visit

## 2018-09-17 NOTE — Telephone Encounter (Signed)
Opened in error

## 2018-10-22 ENCOUNTER — Ambulatory Visit: Payer: Self-pay | Admitting: Obstetrics and Gynecology

## 2018-10-28 NOTE — L&D Delivery Note (Signed)
OB/GYN Faculty Practice Delivery Note  Brandi Andrews is a 28 y.o. G2P1011 s/p VAVD at [redacted]w[redacted]d. She was admitted for IOL for GDMA2.   ROM: 180h 48m with clear fluid GBS Status: Negative/-- (11/12 1213) Maximum Maternal Temperature: 100.23F  Labor Progress: . Initial SVE: 3.5/30/-3. She had received Foley bulb in clinic the day before which fell out the morning of induction. Patient received Cytotec, Pitocin and had SROM. Received epidural. She then progressed to complete. Patient pushed for 3+ hours and able to progress to +2 station but then with little progress.   Delivery Date/Time: 12/9 @ 0915 Delivery: Called to room to assess for vacuum delivery Indication for operative vaginal delivery: Maternal fatigue, pushing for 3+ hours, FHR slower to return to baseline after each contraction  Patient was examined and found to be fully dilated with fetal station of +2.  Patient's bladder was noted to be empty, and there were no known fetal contraindications to operative vaginal delivery. EFW was 3600g by [place]/recent ultrasound.  FHR tracing remarkable for moderate variability, baseline ~ 155 bpm with slow return to baseline after ctx.  Risks of vacuum assistance were discussed in detail, including but not limited to, bleeding, infection, damage to maternal tissues, fetal cephalohematoma, inability to effect vaginal delivery of the head or shoulder dystocia that cannot be resolved by established maneuvers and need for emergency cesarean section.  Patient gave verbal consent. Dr. Ernestina Patches present at bedside.  MityVac Bell cup was positioned over the sagittal suture 3 cm anterior to posterior fontanelle.  Pressure was then increased to 500 mmHg, and the patient was instructed to push.  Pulling was administered along the pelvic curve while patient was pushing; there were 3 contractions and 2 popoffs.  Vacuum was reduced in between contractions.  The infant was then delivered atraumatically in LOP  position, noted to be a viable female infant, Apgars of 8 and 9.  Neonatology present for delivery, however cord clamping delayed x1 minute due to spontaneous movement and cry. There was spontaneous placental delivery, intact with three-vessel cord. 3a degree perineal laceration with deep sulcus extension noted requiring repair with 3-0 Vicryl in the usual fashion, epidural anesthesia. TXA and Methergine given due to continued bleeding and slightly boggy uterus during repair. No clots with uterine sweep x2. Sponge, instrument and needle counts were correct x 2.  The patient and baby were stable after delivery and remained in couplet care, with plans to transfer later to postpartum unit.  Baby Weight: 3694g  Placenta: patholgoy Complications: Vacuum-assisted and 3a tear Lacerations: 3a perineal laceration with deep left sulcus extension EBL: 1137 mL Analgesia: Epidural   Infant:  APGAR (1 MIN): 8   APGAR (5 MINS): 9   APGAR (10 MINS):     Barrington Ellison, MD OB Family Medicine Fellow, [facility] 10/06/2019, 11:02 AM

## 2018-11-11 ENCOUNTER — Ambulatory Visit (INDEPENDENT_AMBULATORY_CARE_PROVIDER_SITE_OTHER): Payer: Commercial Managed Care - PPO | Admitting: Obstetrics and Gynecology

## 2018-11-11 ENCOUNTER — Encounter: Payer: Self-pay | Admitting: Obstetrics and Gynecology

## 2018-11-11 ENCOUNTER — Other Ambulatory Visit (HOSPITAL_COMMUNITY)
Admission: RE | Admit: 2018-11-11 | Discharge: 2018-11-11 | Disposition: A | Discharge status: Home / Self Care | Payer: Commercial Managed Care - PPO | Source: Ambulatory Visit | Attending: Obstetrics and Gynecology | Admitting: Obstetrics and Gynecology

## 2018-11-11 VITALS — BP 125/78 | HR 75 | Wt 228.0 lb

## 2018-11-11 DIAGNOSIS — N870 Mild cervical dysplasia: Secondary | ICD-10-CM | POA: Diagnosis not present

## 2018-11-11 DIAGNOSIS — R1031 Right lower quadrant pain: Secondary | ICD-10-CM

## 2018-11-11 LAB — POCT PREGNANCY, URINE: Preg Test, Ur: NEGATIVE

## 2018-11-11 NOTE — Procedures (Signed)
Colposcopy Procedure Note  Pre-operative Diagnosis:  08/2018 pap: LGSIL 05/2017: colpo (adequate): 10 o'clock bx with hpv effect, ecc negative 02/2017 pap: ASC-H  Post-operative Diagnosis: CIN 1  Procedure Details  LMP: started today. UPT negative.   The risks (including infection, bleeding, pain) and benefits of the procedure were explained to the patient and written informed consent was obtained.  The patient was placed in the dorsal lithotomy position. A Graves was speculum inserted in the vagina, and the cervix was visualized.  AA staining done Lugol's with green filter.  Biopsy at 12 o'clock and then single toothed tenaculum applied and ECC in all four quadrants done. No bleeding after procedure  Findings: diffuse AWE changes. ?increased vascularity at 10-12 o'clock  Adequate: Yes  Specimens: 12 o'clock, ECC  Condition: Stable  Complications: None  Plan: The patient was advised to call for any fever or for prolonged or severe pain or bleeding. She was advised to use OTC analgesics as needed for mild to moderate pain. She was advised to avoid vaginal intercourse for 48 hours or until the bleeding has completely stopped.  Abdominal exam benign. RLQ pain got better after surgery but then came back and has been ongoing for past few months. No new imaging since surgery. Will get early cycle u/s for this month.   Durene Romans MD Attending Center for [employer])

## 2018-11-11 NOTE — Progress Notes (Signed)
Pain on right side, same as pelvic pains as before. When happening 10/10

## 2018-11-19 ENCOUNTER — Ambulatory Visit (HOSPITAL_COMMUNITY)
Admission: RE | Admit: 2018-11-19 | Discharge: 2018-11-19 | Disposition: A | Discharge status: Home / Self Care | Payer: Commercial Managed Care - PPO | Source: Ambulatory Visit | Attending: Obstetrics and Gynecology | Admitting: Obstetrics and Gynecology

## 2018-11-19 DIAGNOSIS — R1031 Right lower quadrant pain: Secondary | ICD-10-CM | POA: Diagnosis present

## 2018-11-23 ENCOUNTER — Encounter: Payer: Self-pay | Admitting: Obstetrics and Gynecology

## 2019-03-02 ENCOUNTER — Ambulatory Visit: Payer: Commercial Managed Care - PPO

## 2019-03-02 ENCOUNTER — Other Ambulatory Visit: Payer: Self-pay

## 2019-03-02 ENCOUNTER — Other Ambulatory Visit (HOSPITAL_COMMUNITY)
Admission: RE | Admit: 2019-03-02 | Discharge: 2019-03-02 | Disposition: A | Discharge status: Home / Self Care | Payer: Commercial Managed Care - PPO | Source: Ambulatory Visit | Attending: Family Medicine | Admitting: Family Medicine

## 2019-03-02 DIAGNOSIS — Z3201 Encounter for pregnancy test, result positive: Secondary | ICD-10-CM

## 2019-03-02 DIAGNOSIS — Z202 Contact with and (suspected) exposure to infections with a predominantly sexual mode of transmission: Secondary | ICD-10-CM | POA: Insufficient documentation

## 2019-03-02 LAB — POCT PREGNANCY, URINE: Preg Test, Ur: POSITIVE — AB

## 2019-03-02 NOTE — Progress Notes (Signed)
Pt here today STD testing and pregnancy test.  Pt reports LMP 12/30/18 with no pain or vaginal bleeding.  EDD 10/06/19 8w 6d today.

## 2019-03-03 ENCOUNTER — Telehealth (INDEPENDENT_AMBULATORY_CARE_PROVIDER_SITE_OTHER): Payer: Commercial Managed Care - PPO | Admitting: Lactation Services

## 2019-03-03 DIAGNOSIS — B9689 Other specified bacterial agents as the cause of diseases classified elsewhere: Secondary | ICD-10-CM

## 2019-03-03 DIAGNOSIS — N760 Acute vaginitis: Secondary | ICD-10-CM

## 2019-03-03 LAB — CERVICOVAGINAL ANCILLARY ONLY
Bacterial vaginitis: POSITIVE — AB
Candida vaginitis: NEGATIVE
Chlamydia: NEGATIVE
Neisseria Gonorrhea: NEGATIVE
Trichomonas: NEGATIVE

## 2019-03-03 MED ORDER — METRONIDAZOLE 500 MG PO TABS: 500.0000 mg | ORAL_TABLET | Freq: Two times a day (BID) | ORAL | 0 refills | Status: DC

## 2019-03-03 NOTE — Telephone Encounter (Signed)
Pt called asking for results from STD testing from yesterday. Reviewed with Verdell Carmine, RN. Reviewed results and called pt at 4 pm to let her know that she is + for BV and all other STD results were negative. Flagyl ordered per protocol. Pt voiced understanding.

## 2019-03-15 ENCOUNTER — Telehealth (INDEPENDENT_AMBULATORY_CARE_PROVIDER_SITE_OTHER): Payer: Commercial Managed Care - PPO | Admitting: *Deleted

## 2019-03-15 ENCOUNTER — Other Ambulatory Visit: Payer: Self-pay

## 2019-03-15 DIAGNOSIS — Z3480 Encounter for supervision of other normal pregnancy, unspecified trimester: Secondary | ICD-10-CM

## 2019-03-15 DIAGNOSIS — O0990 Supervision of high risk pregnancy, unspecified, unspecified trimester: Secondary | ICD-10-CM | POA: Insufficient documentation

## 2019-03-15 MED ORDER — AMBULATORY NON FORMULARY MEDICATION: 1.0000 | 0 refills | Status: DC

## 2019-03-15 NOTE — Progress Notes (Signed)
I connected with  Brandi Andrews on 03/15/19 at  3:15 PM EDT by telephone and MyChart and verified that I am speaking with the correct person using two identifiers.   I discussed the limitations, risks, security and privacy concerns of performing an evaluation and management service by telephone and the availability of in person appointments. I also discussed with the patient that there may be a patient responsible charge related to this service. The patient expressed understanding and agreed to proceed.  Dolores Hoose, RN 03/15/2019  3:12 PM  Order placed for babyscripts and pt educated on it's use.  Pt unable to set up d/t driving at the time of the appointment.  Order placed for BP cuff and a message sent to front office staff to fax prescription.  Pt instructed to bring the cuff to her first visit.  Pt verbalized understanding.

## 2019-03-25 ENCOUNTER — Encounter: Payer: Self-pay | Admitting: Obstetrics and Gynecology

## 2019-03-25 ENCOUNTER — Ambulatory Visit (INDEPENDENT_AMBULATORY_CARE_PROVIDER_SITE_OTHER): Payer: Commercial Managed Care - PPO | Admitting: Obstetrics and Gynecology

## 2019-03-25 ENCOUNTER — Other Ambulatory Visit: Payer: Self-pay

## 2019-03-25 VITALS — BP 127/73 | HR 74 | Temp 98.5°F | Wt 237.0 lb

## 2019-03-25 DIAGNOSIS — Z3480 Encounter for supervision of other normal pregnancy, unspecified trimester: Secondary | ICD-10-CM

## 2019-03-25 DIAGNOSIS — Z3A12 12 weeks gestation of pregnancy: Secondary | ICD-10-CM

## 2019-03-25 DIAGNOSIS — N879 Dysplasia of cervix uteri, unspecified: Secondary | ICD-10-CM

## 2019-03-25 DIAGNOSIS — O26891 Other specified pregnancy related conditions, first trimester: Secondary | ICD-10-CM

## 2019-03-25 NOTE — Progress Notes (Signed)
Subjective:  Brandi Andrews is a 28 y.o. G2P0010 at [redacted]w[redacted]d being seen today for her first OB visit. EDD of 65/6/81 by certain LMP. No chronic medical problems or medications. 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  She is currently monitored for the following issues for this low-risk pregnancy and has BMI 40.0-44.9, adult ([place]); Cervical dysplasia; Noncompliance; and Supervision of other normal pregnancy, antepartum on their problem list.  Patient reports no complaints.   . Vag. Bleeding: None.  Movement: Absent. Denies leaking of fluid.   The following portions of the patient's history were reviewed and updated as appropriate: allergies, current medications, past family history, past medical history, past social history, past surgical history and problem list. Problem list updated.  Objective:   Vitals:   03/25/19 1002  BP: 127/73  Pulse: 74  Temp: 98.5 F (36.9 C)  Weight: 237 lb (107.5 kg)    Fetal Status: Fetal Heart Rate (bpm): 160   Movement: Absent     General:  Alert, oriented and cooperative. Patient is in no acute distress.  Skin: Skin is warm and dry. No rash noted.   Cardiovascular: Normal heart rate noted  Respiratory: Normal respiratory effort, no problems with respiration noted  Abdomen: Soft, gravid, appropriate for gestational age. Pain/Pressure: Present     Pelvic:  Cervical exam deferred        Extremities: Normal range of motion.  Edema: None  Mental Status: Normal mood and affect. Normal behavior. Normal judgment and thought content.   Urinalysis:      Assessment and Plan:  Pregnancy: G2P0010 at [redacted]w[redacted]d  1. Supervision of other normal pregnancy, antepartum Prenatal care and labs reviewed with pt. Genetic testing reviewed. To contact insurance company requiring coverage. - Culture, OB Urine - Obstetric Panel, Including HIV - Hemoglobinopathy Evaluation - [place] MFM OB DETAIL +14 WK; Future  2. Cervical dysplasia CIN 1 on colpo 1/20 Repeat pap smear PP  Preterm labor  symptoms and general obstetric precautions including but not limited to vaginal bleeding, contractions, leaking of fluid and fetal movement were reviewed in detail with the patient. Please refer to After Visit Summary for other counseling recommendations.  Return in about 4 weeks (around 04/22/2019) for OB visit, virtual.   Chancy Milroy, MD

## 2019-03-25 NOTE — Patient Instructions (Signed)
First Trimester of Pregnancy  The first trimester of pregnancy is from week 1 until the end of week 13 (months 1 through 3). A week after a sperm fertilizes an egg, the egg will implant on the wall of the uterus. This embryo will begin to develop into a baby. Genes from you and your partner will form the baby. The female genes will determine whether the baby will be a boy or a girl. At 6-8 weeks, the eyes and face will be formed, and the heartbeat can be seen on ultrasound. At the end of 12 weeks, all the baby's organs will be formed.  Now that you are pregnant, you will want to do everything you can to have a healthy baby. Two of the most important things are to get good prenatal care and to follow your health care provider's instructions. Prenatal care is all the medical care you receive before the baby's birth. This care will help prevent, find, and treat any problems during the pregnancy and childbirth.  Body changes during your first trimester  Your body goes through many changes during pregnancy. The changes vary from woman to woman.   You may gain or lose a couple of pounds at first.   You may feel sick to your stomach (nauseous) and you may throw up (vomit). If the vomiting is uncontrollable, call your health care provider.   You may tire easily.   You may develop headaches that can be relieved by medicines. All medicines should be approved by your health care provider.   You may urinate more often. Painful urination may mean you have a bladder infection.   You may develop heartburn as a result of your pregnancy.   You may develop constipation because certain hormones are causing the muscles that push stool through your intestines to slow down.   You may develop hemorrhoids or swollen veins (varicose veins).   Your breasts may begin to grow larger and become tender. Your nipples may stick out more, and the tissue that surrounds them (areola) may become darker.   Your gums may bleed and may be  sensitive to brushing and flossing.   Dark spots or blotches (chloasma, mask of pregnancy) may develop on your face. This will likely fade after the baby is born.   Your menstrual periods will stop.   You may have a loss of appetite.   You may develop cravings for certain kinds of food.   You may have changes in your emotions from day to day, such as being excited to be pregnant or being concerned that something may go wrong with the pregnancy and baby.   You may have more vivid and strange dreams.   You may have changes in your hair. These can include thickening of your hair, rapid growth, and changes in texture. Some women also have hair loss during or after pregnancy, or hair that feels dry or thin. Your hair will most likely return to normal after your baby is born.  What to expect at prenatal visits  During a routine prenatal visit:   You will be weighed to make sure you and the baby are growing normally.   Your blood pressure will be taken.   Your abdomen will be measured to track your baby's growth.   The fetal heartbeat will be listened to between weeks 10 and 14 of your pregnancy.   Test results from any previous visits will be discussed.  Your health care provider may ask you:     How you are feeling.   If you are feeling the baby move.   If you have had any abnormal symptoms, such as leaking fluid, bleeding, severe headaches, or abdominal cramping.   If you are using any tobacco products, including cigarettes, chewing tobacco, and electronic cigarettes.   If you have any questions.  Other tests that may be performed during your first trimester include:   Blood tests to find your blood type and to check for the presence of any previous infections. The tests will also be used to check for low iron levels (anemia) and protein on red blood cells (Rh antibodies). Depending on your risk factors, or if you previously had diabetes during pregnancy, you may have tests to check for high blood sugar  that affects pregnant women (gestational diabetes).   Urine tests to check for infections, diabetes, or protein in the urine.   An ultrasound to confirm the proper growth and development of the baby.   Fetal screens for spinal cord problems (spina bifida) and Down syndrome.   HIV (human immunodeficiency virus) testing. Routine prenatal testing includes screening for HIV, unless you choose not to have this test.   You may need other tests to make sure you and the baby are doing well.  Follow these instructions at home:  Medicines   Follow your health care provider's instructions regarding medicine use. Specific medicines may be either safe or unsafe to take during pregnancy.   Take a prenatal vitamin that contains at least 600 micrograms (mcg) of folic acid.   If you develop constipation, try taking a stool softener if your health care provider approves.  Eating and drinking     Eat a balanced diet that includes fresh fruits and vegetables, whole grains, good sources of protein such as meat, eggs, or tofu, and low-fat dairy. Your health care provider will help you determine the amount of weight gain that is right for you.   Avoid raw meat and uncooked cheese. These carry germs that can cause birth defects in the baby.   Eating four or five small meals rather than three large meals a day may help relieve nausea and vomiting. If you start to feel nauseous, eating a few soda crackers can be helpful. Drinking liquids between meals, instead of during meals, also seems to help ease nausea and vomiting.   Limit foods that are high in fat and processed sugars, such as fried and sweet foods.   To prevent constipation:  ? Eat foods that are high in fiber, such as fresh fruits and vegetables, whole grains, and beans.  ? Drink enough fluid to keep your urine clear or pale yellow.  Activity   Exercise only as directed by your health care provider. Most women can continue their usual exercise routine during  pregnancy. Try to exercise for 30 minutes at least 5 days a week. Exercising will help you:  ? Control your weight.  ? Stay in shape.  ? Be prepared for labor and delivery.   Experiencing pain or cramping in the lower abdomen or lower back is a good sign that you should stop exercising. Check with your health care provider before continuing with normal exercises.   Try to avoid standing for long periods of time. Move your legs often if you must stand in one place for a long time.   Avoid heavy lifting.   Wear low-heeled shoes and practice good posture.   You may continue to have sex unless your health care   provider tells you not to.  Relieving pain and discomfort   Wear a good support bra to relieve breast tenderness.   Take warm sitz baths to soothe any pain or discomfort caused by hemorrhoids. Use hemorrhoid cream if your health care provider approves.   Rest with your legs elevated if you have leg cramps or low back pain.   If you develop varicose veins in your legs, wear support hose. Elevate your feet for 15 minutes, 3-4 times a day. Limit salt in your diet.  Prenatal care   Schedule your prenatal visits by the twelfth week of pregnancy. They are usually scheduled monthly at first, then more often in the last 2 months before delivery.   Write down your questions. Take them to your prenatal visits.   Keep all your prenatal visits as told by your health care provider. This is important.  Safety   Wear your seat belt at all times when driving.   Make a list of emergency phone numbers, including numbers for family, friends, the hospital, and police and fire departments.  General instructions   Ask your health care provider for a referral to a local prenatal education class. Begin classes no later than the beginning of month 6 of your pregnancy.   Ask for help if you have counseling or nutritional needs during pregnancy. Your health care provider can offer advice or refer you to specialists for help  with various needs.   Do not use hot tubs, steam rooms, or saunas.   Do not douche or use tampons or scented sanitary pads.   Do not cross your legs for long periods of time.   Avoid cat litter boxes and soil used by cats. These carry germs that can cause birth defects in the baby and possibly loss of the fetus by miscarriage or stillbirth.   Avoid all smoking, herbs, alcohol, and medicines not prescribed by your health care provider. Chemicals in these products affect the formation and growth of the baby.   Do not use any products that contain nicotine or tobacco, such as cigarettes and e-cigarettes. If you need help quitting, ask your health care provider. You may receive counseling support and other resources to help you quit.   Schedule a dentist appointment. At home, brush your teeth with a soft toothbrush and be gentle when you floss.  Contact a health care provider if:   You have dizziness.   You have mild pelvic cramps, pelvic pressure, or nagging pain in the abdominal area.   You have persistent nausea, vomiting, or diarrhea.   You have a bad smelling vaginal discharge.   You have pain when you urinate.   You notice increased swelling in your face, hands, legs, or ankles.   You are exposed to fifth disease or chickenpox.   You are exposed to German measles (rubella) and have never had it.  Get help right away if:   You have a fever.   You are leaking fluid from your vagina.   You have spotting or bleeding from your vagina.   You have severe abdominal cramping or pain.   You have rapid weight gain or loss.   You vomit blood or material that looks like coffee grounds.   You develop a severe headache.   You have shortness of breath.   You have any kind of trauma, such as from a fall or a car accident.  Summary   The first trimester of pregnancy is from week 1 until   the end of week 13 (months 1 through 3).   Your body goes through many changes during pregnancy. The changes vary from  woman to woman.   You will have routine prenatal visits. During those visits, your health care provider will examine you, discuss any test results you may have, and talk with you about how you are feeling.  This information is not intended to replace advice given to you by your health care provider. Make sure you discuss any questions you have with your health care provider.  Document Released: 10/08/2001 Document Revised: 09/25/2016 Document Reviewed: 09/25/2016  Elsevier Interactive Patient Education  2019 [employer]

## 2019-03-27 ENCOUNTER — Inpatient Hospital Stay (HOSPITAL_COMMUNITY)
Admission: AD | Admit: 2019-03-27 | Discharge: 2019-03-27 | Disposition: A | Discharge status: Home / Self Care | Payer: Commercial Managed Care - PPO | Attending: Obstetrics and Gynecology | Admitting: Obstetrics and Gynecology

## 2019-03-27 ENCOUNTER — Encounter (HOSPITAL_COMMUNITY): Payer: Self-pay | Admitting: *Deleted

## 2019-03-27 ENCOUNTER — Other Ambulatory Visit: Payer: Self-pay

## 2019-03-27 DIAGNOSIS — R1030 Lower abdominal pain, unspecified: Secondary | ICD-10-CM | POA: Diagnosis not present

## 2019-03-27 DIAGNOSIS — O26891 Other specified pregnancy related conditions, first trimester: Secondary | ICD-10-CM | POA: Insufficient documentation

## 2019-03-27 DIAGNOSIS — Z880 Allergy status to penicillin: Secondary | ICD-10-CM | POA: Insufficient documentation

## 2019-03-27 DIAGNOSIS — Z3A12 12 weeks gestation of pregnancy: Secondary | ICD-10-CM | POA: Insufficient documentation

## 2019-03-27 DIAGNOSIS — O2341 Unspecified infection of urinary tract in pregnancy, first trimester: Secondary | ICD-10-CM | POA: Diagnosis not present

## 2019-03-27 DIAGNOSIS — Z87891 Personal history of nicotine dependence: Secondary | ICD-10-CM | POA: Insufficient documentation

## 2019-03-27 LAB — URINALYSIS, ROUTINE W REFLEX MICROSCOPIC
Bilirubin Urine: NEGATIVE
Glucose, UA: NEGATIVE mg/dL
Ketones, ur: 15 mg/dL — AB
Nitrite: POSITIVE — AB
Protein, ur: NEGATIVE mg/dL
Specific Gravity, Urine: 1.01 (ref 1.005–1.030)
pH: 6 (ref 5.0–8.0)

## 2019-03-27 LAB — URINALYSIS, MICROSCOPIC (REFLEX)

## 2019-03-27 MED ORDER — CEPHALEXIN 500 MG PO CAPS: 500.0000 mg | ORAL_CAPSULE | Freq: Four times a day (QID) | ORAL | 0 refills | Status: DC

## 2019-03-27 NOTE — Discharge Instructions (Signed)
Pregnancy and Urinary Tract Infection What is a urinary tract infection?  A urinary tract infection (UTI) is an infection of any part of the urinary tract. This includes the kidneys, the tubes that connect your kidneys to your bladder (ureters), the bladder, and the tube that carries urine out of your body (urethra). These organs make, store, and get rid of urine in the body.  An upper UTI affects the ureters and kidneys (pyelonephritis), and a lower UTI affects the bladder (cystitis) and urethra (urethritis). Most urinary tract infections are caused by bacteria in your genital area, around the entrance to your urinary tract (urethra). These bacteria grow and cause irritation and inflammation of your urinary tract. Why am I more likely to get a UTI during pregnancy? You are more likely to develop a UTI during pregnancy because:  The physical and hormonal changes your body goes through can make it easier for bacteria to get into your urinary tract.  Your growing baby puts pressure on your uterus and can affect urine flow. Does a UTI place my baby at risk? An untreated UTI during pregnancy could lead to a kidney infection, which can cause health problems that could affect your baby. Possible complications of an untreated UTI include:  Having your baby before 37 weeks of pregnancy (premature).  Having a baby with a low birth weight.  Developing high blood pressure during pregnancy (preeclampsia).  Having a low hemoglobin level (anemia). What are the symptoms of a UTI? Symptoms of a UTI include:  Needing to urinate right away (urgently).  Frequent urination or passing small amounts of urine frequently.  Pain or burning with urination.  Blood in the urine.  Urine that smells bad or unusual.  Trouble urinating.  Cloudy urine.  Pain in the abdomen or lower back.  Vaginal discharge. You may also have:  Vomiting or a decreased appetite.  Confusion.  Irritability or  tiredness.  A fever.  Diarrhea. What are the treatment options for a UTI during pregnancy? Treatment for this condition may include:  Antibiotic medicines that are safe to take during pregnancy.  Other medicines to treat less common causes of UTI. How can I prevent a UTI? To prevent a UTI:  Go to the bathroom as soon as you feel the need. Do not hold urine for long periods of time.  Always wipe from front to back after a bowel movement. Use each tissue one time when you wipe.  Empty your bladder after sex.  Keep your genital area dry.  Drink 6-10 glasses of water each day.  Do not douche or use deodorant sprays. Contact a health care provider if:  Your symptoms do not improve or they get worse.  You have abnormal vaginal discharge. Get help right away if:  You have a fever.  You have nausea and vomiting.  You have back or side pain.  You feel contractions in your uterus.  You have lower belly pain.  You have a gush of fluid from your vagina.  You have blood in your urine. Summary  A urinary tract infection (UTI) is an infection of any part of the urinary tract, which includes the kidneys, ureters, bladder, and urethra.  Most urinary tract infections are caused by bacteria in your genital area, around the entrance to your urinary tract (urethra).  You are more likely to develop a UTI during pregnancy.  If you were prescribed an antibiotic, take it as told by your health care provider. Do not stop taking the  bladder, and urethra.  · Most urinary tract infections are caused by bacteria in your genital area, around the entrance to your urinary tract (urethra).  · You are more likely to develop a UTI during pregnancy.  · If you were prescribed an antibiotic, take it as told by your health care provider. Do not stop taking the antibiotic even if you start to feel better.  This information is not intended to replace advice given to you by your health care provider. Make sure you discuss any questions you have with your health care provider.  Document Released: 02/08/2011 Document Revised: 12/09/2017 Document Reviewed: 09/04/2015  Elsevier Interactive Patient Education © 2019 [employer] Inc[employer]

## 2019-03-27 NOTE — MAU Note (Signed)
Brandi Andrews is a 28 y.o. at [redacted]w[redacted]d here in MAU reporting: for the past couple of days she has been having a shooting pain in abdomen and cramping. No bleeding, no abnormal discharge. Reports urine has a foul odor, states she also has urinary urgency and frequency and dysuria.  Onset of complaint: couple of days  Pain score: 5/10  Vitals:   03/27/19 1154  BP: 110/63  Pulse: 84  Resp: 16  Temp: 98.8 F (37.1 C)  SpO2: 99%     Lab orders placed from triage: UA

## 2019-03-27 NOTE — MAU Provider Note (Signed)
Chief Complaint: Abdominal Pain and Dysuria   First Provider Initiated Contact with Patient 03/27/19 1241     SUBJECTIVE HPI: Brandi Andrews is a 28 y.o. G2P0010 at [redacted]w[redacted]d who presents to [facility] reporting abdominal pain & urinary symptoms. Symptoms started a few days ago. Reports intermittent shooting pains in her lower abdomen. Also reports dysuria & urinary frequency. Recently has noticed her urine has a foul odor. Denies fever/chills, flank pain, hematuria, n/v, or vaginal bleeding.   Location: abdomen Quality: shooting Severity: 5/10 on pain scale Duration: 2 days Timing: intermittent Modifying factors: none Associated signs and symptoms: dysuria & urinary frequency  Past Medical History:  Diagnosis Date  . Medical history non-contributory   . Obesity   . Ovarian cyst    OB History  Gravida Para Term Preterm AB Living  2       1 0  SAB TAB Ectopic Multiple Live Births  1       0    # Outcome Date GA Lbr Len/2nd Weight Sex Delivery Anes PTL Lv  2 Current           1 SAB 2012            Obstetric Comments  G1: 2012 early SAB   Past Surgical History:  Procedure Laterality Date  . EYE SURGERY    . LAPAROSCOPIC SALPINGO OOPHERECTOMY Right 03/03/2018   Procedure: LAPAROSCOPIC SALPINGO OOPHORECTOMY;  Surgeon: Osborne Oman, MD;  Location: [facility];  Service: Gynecology;  Laterality: Right;   Social History   Socioeconomic History  . Marital status: Single    Spouse name: Not on file  . Number of children: Not on file  . Years of education: Not on file  . Highest education level: Not on file  Occupational History  . Not on file  Social Needs  . Financial resource strain: Not on file  . Food insecurity:    Worry: Never true    Inability: Never true  . Transportation needs:    Medical: No    Non-medical: No  Tobacco Use  . Smoking status: Former Smoker    Packs/day: 0.10    Years: 4.00    Pack years: 0.40    Types: Cigarettes  . Smokeless  tobacco: Never Used  Substance and Sexual Activity  . Alcohol use: Not Currently  . Drug use: No  . Sexual activity: Yes  Lifestyle  . Physical activity:    Days per week: Not on file    Minutes per session: Not on file  . Stress: Not on file  Relationships  . Social connections:    Talks on phone: Not on file    Gets together: Not on file    Attends religious service: Not on file    Active member of club or organization: Not on file    Attends meetings of clubs or organizations: Not on file    Relationship status: Not on file  . Intimate partner violence:    Fear of current or ex partner: Not on file    Emotionally abused: Not on file    Physically abused: Not on file    Forced sexual activity: Not on file  Other Topics Concern  . Not on file  Social History Narrative  . Not on file   Family History  Problem Relation Age of Onset  . Diabetes Mother   . Breast cancer Mother 70  . Hypertension Father    No current facility-administered medications on file prior  to encounter.    No current outpatient medications on file prior to encounter.   Allergies  Allergen Reactions  . Mushroom Extract Complex Swelling and Other (See Comments)    Lips swell and patient gets dizzy  . Penicillins Other (See Comments)    Was told from childhood: Has patient had a PCN reaction causing immediate rash, facial/tongue/throat swelling, SOB or lightheadedness with hypotension: Unknown Has patient had a PCN reaction causing severe rash involving mucus membranes or skin necrosis: Unknown Has patient had a PCN reaction that required hospitalization: Unknown Has patient had a PCN reaction occurring within the last 10 years: No If all of the above answers are "NO", then may proceed with Cephalosporin use.     I have reviewed patient's Past Medical Hx, Surgical Hx, Family Hx, Social Hx, medications and allergies.   Review of Systems  Constitutional: Negative.   Gastrointestinal: Positive  for abdominal pain. Negative for constipation, diarrhea, nausea and vomiting.  Genitourinary: Positive for dysuria, frequency and urgency. Negative for flank pain, hematuria, vaginal bleeding and vaginal discharge.    OBJECTIVE Patient Vitals for the past 24 hrs:  BP Temp Temp src Pulse Resp SpO2 Height Weight  03/27/19 1340 120/70 - - 71 - - - -  03/27/19 1230 (!) 110/55 - - 85 - - - -  03/27/19 1154 110/63 98.8 F (37.1 C) Oral 84 16 99 % - -  03/27/19 1150 - - - - - - 5\' 7"  (1.702 m) 108.3 kg   Constitutional: Well-developed, well-nourished female in no acute distress.  Cardiovascular: normal rate & rhythm, no murmur Respiratory: normal rate and effort. Lung sounds clear throughout GI: no cvat.  Abd soft, non-tender, Pos BS x 4. No guarding or rebound tenderness MS: Extremities nontender, no edema, normal ROM Neurologic: Alert and oriented x 4.  GU:  Cervix closed/thick   LAB RESULTS Results for orders placed or performed during the hospital encounter of 03/27/19 (from the past 24 hour(s))  Urinalysis, Routine w reflex microscopic     Status: Abnormal   Collection Time: 03/27/19 12:32 PM  Result Value Ref Range   Color, Urine YELLOW YELLOW   APPearance CLOUDY (A) CLEAR   Specific Gravity, Urine 1.010 1.005 - 1.030   pH 6.0 5.0 - 8.0   Glucose, UA NEGATIVE NEGATIVE mg/dL   Hgb urine dipstick TRACE (A) NEGATIVE   Bilirubin Urine NEGATIVE NEGATIVE   Ketones, ur 15 (A) NEGATIVE mg/dL   Protein, ur NEGATIVE NEGATIVE mg/dL   Nitrite POSITIVE (A) NEGATIVE   Leukocytes,Ua LARGE (A) NEGATIVE  Urinalysis, Microscopic (reflex)     Status: Abnormal   Collection Time: 03/27/19 12:32 PM  Result Value Ref Range   RBC / HPF 0-5 0 - 5 RBC/hpf   WBC, UA 21-50 0 - 5 WBC/hpf   Bacteria, UA MANY (A) NONE SEEN   Squamous Epithelial / LPF 0-5 0 - 5   Mucus PRESENT    Budding Yeast PRESENT     IMAGING No results found.  MAU COURSE Orders Placed This Encounter  Procedures  . Culture,  OB Urine  . Urinalysis, Routine w reflex microscopic  . Urinalysis, Microscopic (reflex)  . Discharge patient   Meds ordered this encounter  Medications  . cephALEXin (KEFLEX) 500 MG capsule    Sig: Take 1 capsule (500 mg total) by mouth 4 (four) times daily.    Dispense:  28 capsule    Refill:  0    Order Specific Question:   Supervising  Provider    Answer:   Aletha Halim [[id number]]    MDM FHT present via doppler Cervix closed/thick  U/a with + nitrites. Will send for culture & tx for UTI. No CVAT & pt afebrile.  Pt reports penicillin allergy as a child. States she has no idea what her allergy was. Per epic (and patient), she has taken penicillin & amoxicillin 3 times in the last few years. Pt reports no issues with taken these medications. Will rx keflex.   ASSESSMENT 1. Urinary tract infection in mother during first trimester of pregnancy   2. [redacted] weeks gestation of pregnancy     PLAN Discharge home in stable condition. Discussed reasons to return to MAU Urine culture pending Rx keflex  Allergies as of 03/27/2019      Reactions   Mushroom Extract Complex Swelling, Other (See Comments)   Lips swell and patient gets dizzy   Penicillins Other (See Comments)   Was told from childhood: Has patient had a PCN reaction causing immediate rash, facial/tongue/throat swelling, SOB or lightheadedness with hypotension: Unknown Has patient had a PCN reaction causing severe rash involving mucus membranes or skin necrosis: Unknown Has patient had a PCN reaction that required hospitalization: Unknown Has patient had a PCN reaction occurring within the last 10 years: No If all of the above answers are "NO", then may proceed with Cephalosporin use.      Medication List    TAKE these medications   AMBULATORY NON FORMULARY MEDICATION 1 Device by Other route once a week. Blood Pressure Cuff Large Monitored Regularly at home ICD 10:Z34.90   cephALEXin 500 MG capsule Commonly known  as:  KEFLEX Take 1 capsule (500 mg total) by mouth 4 (four) times daily.   PRENATAL PO Take by mouth.        Jorje Guild, NP 03/27/2019  2:21 PM

## 2019-03-28 LAB — URINE CULTURE, OB REFLEX

## 2019-03-28 LAB — CULTURE, OB URINE

## 2019-03-29 LAB — HEMOGLOBINOPATHY EVALUATION
Ferritin: 70 ng/mL (ref 15–150)
Hgb A2 Quant: 3.4 % — ABNORMAL HIGH (ref 1.8–3.2)
Hgb A: 57.6 % — ABNORMAL LOW (ref 96.4–98.8)
Hgb C: 0 %
Hgb F Quant: 0 % (ref 0.0–2.0)
Hgb S: 39 % — ABNORMAL HIGH
Hgb Solubility: POSITIVE — AB
Hgb Variant: 0 %

## 2019-03-29 LAB — OBSTETRIC PANEL, INCLUDING HIV
Antibody Screen: NEGATIVE
Basophils Absolute: 0 10*3/uL (ref 0.0–0.2)
Basos: 0 %
EOS (ABSOLUTE): 0.1 10*3/uL (ref 0.0–0.4)
Eos: 1 %
HIV Screen 4th Generation wRfx: NONREACTIVE
Hematocrit: 35.1 % (ref 34.0–46.6)
Hemoglobin: 11.2 g/dL (ref 11.1–15.9)
Hepatitis B Surface Ag: NEGATIVE
Immature Grans (Abs): 0.1 10*3/uL (ref 0.0–0.1)
Immature Granulocytes: 1 %
Lymphocytes Absolute: 2 10*3/uL (ref 0.7–3.1)
Lymphs: 26 %
MCH: 28.4 pg (ref 26.6–33.0)
MCHC: 31.9 g/dL (ref 31.5–35.7)
MCV: 89 fL (ref 79–97)
Monocytes Absolute: 0.5 10*3/uL (ref 0.1–0.9)
Monocytes: 6 %
Neutrophils Absolute: 5 10*3/uL (ref 1.4–7.0)
Neutrophils: 66 %
Platelets: 298 10*3/uL (ref 150–450)
RBC: 3.95 x10E6/uL (ref 3.77–5.28)
RDW: 13.2 % (ref 11.7–15.4)
RPR Ser Ql: NONREACTIVE
Rh Factor: POSITIVE
Rubella Antibodies, IGG: 5.16 index (ref >0.99)
WBC: 7.6 10*3/uL (ref 3.4–10.8)

## 2019-03-29 LAB — CULTURE, OB URINE: Culture: >=100000 — AB

## 2019-04-06 ENCOUNTER — Encounter (HOSPITAL_COMMUNITY): Payer: Self-pay

## 2019-04-14 ENCOUNTER — Telehealth: Payer: Self-pay | Admitting: *Deleted

## 2019-04-14 ENCOUNTER — Telehealth: Payer: Self-pay | Admitting: Family Medicine

## 2019-04-14 NOTE — Telephone Encounter (Signed)
Pt left voice mail message stating she would like her records sent to another facility. She requests call back.

## 2019-04-14 NOTE — Telephone Encounter (Signed)
Patient is requesting her records to see if she can change providers. I informed her she needs to sign a ROI either here at this office, or with [facility].

## 2019-04-15 ENCOUNTER — Encounter (HOSPITAL_COMMUNITY): Payer: Self-pay | Admitting: Advanced Practice Midwife

## 2019-04-15 ENCOUNTER — Inpatient Hospital Stay (HOSPITAL_COMMUNITY)
Admission: AD | Admit: 2019-04-15 | Discharge: 2019-04-15 | Disposition: A | Discharge status: Home / Self Care | Payer: Commercial Managed Care - PPO | Attending: Obstetrics and Gynecology | Admitting: Obstetrics and Gynecology

## 2019-04-15 ENCOUNTER — Other Ambulatory Visit: Payer: Self-pay

## 2019-04-15 ENCOUNTER — Inpatient Hospital Stay (HOSPITAL_BASED_OUTPATIENT_CLINIC_OR_DEPARTMENT_OTHER): Payer: Commercial Managed Care - PPO

## 2019-04-15 DIAGNOSIS — O26892 Other specified pregnancy related conditions, second trimester: Secondary | ICD-10-CM

## 2019-04-15 DIAGNOSIS — Z833 Family history of diabetes mellitus: Secondary | ICD-10-CM | POA: Insufficient documentation

## 2019-04-15 DIAGNOSIS — Z3A15 15 weeks gestation of pregnancy: Secondary | ICD-10-CM

## 2019-04-15 DIAGNOSIS — Z8249 Family history of ischemic heart disease and other diseases of the circulatory system: Secondary | ICD-10-CM | POA: Insufficient documentation

## 2019-04-15 DIAGNOSIS — Z87891 Personal history of nicotine dependence: Secondary | ICD-10-CM | POA: Insufficient documentation

## 2019-04-15 DIAGNOSIS — Z880 Allergy status to penicillin: Secondary | ICD-10-CM | POA: Insufficient documentation

## 2019-04-15 DIAGNOSIS — R1084 Generalized abdominal pain: Secondary | ICD-10-CM | POA: Diagnosis not present

## 2019-04-15 DIAGNOSIS — O468X2 Other antepartum hemorrhage, second trimester: Secondary | ICD-10-CM

## 2019-04-15 DIAGNOSIS — O418X20 Other specified disorders of amniotic fluid and membranes, second trimester, not applicable or unspecified: Secondary | ICD-10-CM | POA: Diagnosis not present

## 2019-04-15 DIAGNOSIS — Z803 Family history of malignant neoplasm of breast: Secondary | ICD-10-CM | POA: Insufficient documentation

## 2019-04-15 DIAGNOSIS — O208 Other hemorrhage in early pregnancy: Secondary | ICD-10-CM | POA: Diagnosis not present

## 2019-04-15 DIAGNOSIS — Z3480 Encounter for supervision of other normal pregnancy, unspecified trimester: Secondary | ICD-10-CM

## 2019-04-15 DIAGNOSIS — O4692 Antepartum hemorrhage, unspecified, second trimester: Secondary | ICD-10-CM

## 2019-04-15 DIAGNOSIS — N898 Other specified noninflammatory disorders of vagina: Secondary | ICD-10-CM | POA: Diagnosis present

## 2019-04-15 LAB — CBC WITH DIFFERENTIAL/PLATELET
Abs Immature Granulocytes: 0.09 10*3/uL — ABNORMAL HIGH (ref 0.00–0.07)
Basophils Absolute: 0 10*3/uL (ref 0.0–0.1)
Basophils Relative: 0 %
Eosinophils Absolute: 0.1 10*3/uL (ref 0.0–0.5)
Eosinophils Relative: 1 %
HCT: 31.4 % — ABNORMAL LOW (ref 36.0–46.0)
Hemoglobin: 10.7 g/dL — ABNORMAL LOW (ref 12.0–15.0)
Immature Granulocytes: 1 %
Lymphocytes Relative: 22 %
Lymphs Abs: 1.9 10*3/uL (ref 0.7–4.0)
MCH: 28.5 pg (ref 26.0–34.0)
MCHC: 34.1 g/dL (ref 30.0–36.0)
MCV: 83.5 fL (ref 80.0–100.0)
Monocytes Absolute: 0.5 10*3/uL (ref 0.1–1.0)
Monocytes Relative: 6 %
Neutro Abs: 6 10*3/uL (ref 1.7–7.7)
Neutrophils Relative %: 70 %
Platelets: 265 10*3/uL (ref 150–400)
RBC: 3.76 MIL/uL — ABNORMAL LOW (ref 3.87–5.11)
RDW: 12.5 % (ref 11.5–15.5)
WBC: 8.5 10*3/uL (ref 4.0–10.5)
nRBC: 0 % (ref 0.0–0.2)

## 2019-04-15 LAB — WET PREP, GENITAL
Clue Cells Wet Prep HPF POC: NONE SEEN
Sperm: NONE SEEN
Trich, Wet Prep: NONE SEEN
Yeast Wet Prep HPF POC: NONE SEEN

## 2019-04-15 LAB — URINALYSIS, ROUTINE W REFLEX MICROSCOPIC
Bacteria, UA: NONE SEEN
Bilirubin Urine: NEGATIVE
Glucose, UA: NEGATIVE mg/dL
Ketones, ur: NEGATIVE mg/dL
Leukocytes,Ua: NEGATIVE
Nitrite: NEGATIVE
Protein, ur: NEGATIVE mg/dL
Specific Gravity, Urine: 1.015 (ref 1.005–1.030)
pH: 5 (ref 5.0–8.0)

## 2019-04-15 NOTE — Discharge Instructions (Signed)

## 2019-04-15 NOTE — MAU Provider Note (Signed)
History     CSN: [id number]  Arrival date and time: 04/15/19 1017   None     Chief Complaint  Patient presents with  . Vaginal Bleeding   HPI Brandi Andrews is a 28 y.o. G2P0010 at 34w1dwho presents to MAU with chief complaint of vaginal bleeding. This is a new problem, onset between 0930 and 1000 this morning. Patient reports seeing bleeding when she wiped and stains on her underwear. She is unsure about ongoing vaginal bleeding.  Patient reports additional complaint of abdominal pain. She describes mild "sharp" but generalized pain which she rates as 2-3/10. She gestures to the left side of her torso from the bottom of her rib cage to the top of her left hip. She denies aggravating or alleviating factors. She has not taken medication or tried other treatments for this complaint.  Patient is s/p UTI diagnosis on 03/26/2018. She was prescribed Keflex and reports she completed that prescription.   Patient is remote from sexual intercourse. She denies dysuria, abdominal tenderness, abnormal vaginal discharge, fever, falls or recent illness.  OB History    Gravida  2   Para      Term      Preterm      AB  1   Living  0     SAB  1   TAB      Ectopic      Multiple      Live Births  0        Obstetric Comments  G1: 2012 early SAB        Past Medical History:  Diagnosis Date  . Medical history non-contributory   . Obesity   . Ovarian cyst     Past Surgical History:  Procedure Laterality Date  . EYE SURGERY    . LAPAROSCOPIC SALPINGO OOPHERECTOMY Right 03/03/2018   Procedure: LAPAROSCOPIC SALPINGO OOPHORECTOMY;  Surgeon: AOsborne Oman MD;  Location: [facility];  Service: Gynecology;  Laterality: Right;    Family History  Problem Relation Age of Onset  . Diabetes Mother   . Breast cancer Mother 544 . Hypertension Father     Social History   Tobacco Use  . Smoking status: Former Smoker    Packs/day: 0.10    Years: 4.00    Pack years: 0.40     Types: Cigarettes  . Smokeless tobacco: Never Used  Substance Use Topics  . Alcohol use: Not Currently  . Drug use: No    Allergies:  Allergies  Allergen Reactions  . Mushroom Extract Complex Swelling and Other (See Comments)    Lips swell and patient gets dizzy  . Penicillins Other (See Comments)    Was told from childhood: Has patient had a PCN reaction causing immediate rash, facial/tongue/throat swelling, SOB or lightheadedness with hypotension: Unknown Has patient had a PCN reaction causing severe rash involving mucus membranes or skin necrosis: Unknown Has patient had a PCN reaction that required hospitalization: Unknown Has patient had a PCN reaction occurring within the last 10 years: No If all of the above answers are "NO", then may proceed with Cephalosporin use.     Medications Prior to Admission  Medication Sig Dispense Refill Last Dose  . Prenatal Vit-Fe Fumarate-FA (PRENATAL PO) Take by mouth.   04/15/2019 at 0930  . AMBULATORY NON FORMULARY MEDICATION 1 Device by Other route once a week. Blood Pressure Cuff Large Monitored Regularly at home ICD 10:Z34.90 1 kit 0   . cephALEXin (KEFLEX) 500 MG  capsule Take 1 capsule (500 mg total) by mouth 4 (four) times daily. 28 capsule 0     Review of Systems  Constitutional: Negative for chills and fever.  Respiratory: Negative for shortness of breath.   Gastrointestinal: Positive for abdominal pain. Negative for constipation, diarrhea, nausea and vomiting.  Genitourinary: Positive for vaginal bleeding. Negative for difficulty urinating, dyspareunia, dysuria, flank pain, pelvic pain and vaginal discharge.  Musculoskeletal: Negative for back pain.  All other systems reviewed and are negative.  Physical Exam   Blood pressure 124/68, pulse 99, temperature 98.7 F (37.1 C), resp. rate 20, height _0  (1.702 m), weight 109.8 kg, last menstrual period 12/30/2018, SpO2 98 %.  Physical Exam  Nursing note and vitals  reviewed. Constitutional: She is oriented to person, place, and time. She appears well-developed and well-nourished.  Cardiovascular: Normal rate.  Respiratory: Effort normal.  GI: Soft. She exhibits no distension. There is no abdominal tenderness. There is no rebound and no guarding.  Genitourinary:    Uterus normal.     Vaginal discharge present.     Genitourinary Comments: Small amount of dark red discharge proximal to cervix. Removed with fox swab x 1. Slow trickle of bleeding from cervical os following application of fox swab. No CMT   Neurological: She is alert and oriented to person, place, and time.  Skin: Skin is warm and dry.  Psychiatric: She has a normal mood and affect. Her behavior is normal. Thought content normal.    MAU Course/MDM  Procedures: Sterile speculum exam  --Cautioned patient that today's imaging is considered an OB Limited assessment and is not a replacement for her 19 week screening for fetal aneuploidy --Patient posed questions about being removed from work or placed on bedrest for subchorionic hemorrhage. Reviewed strong recommendation for pelvic rest and listing restrictions, summarized in AVS  Patient Vitals for the past 24 hrs:  BP Temp Pulse Resp SpO2 Height Weight  04/15/19 1210 115/70 99.1 F (37.3 C) 94 19 100 % - -  04/15/19 1038 124/68 98.7 F (37.1 C) 99 20 98 % _1  (1.702 m) 109.8 kg    Results for orders placed or performed during the hospital encounter of 04/15/19 (from the past 24 hour(s))  Wet prep, genital     Status: Abnormal   Collection Time: 04/15/19 10:49 AM   Specimen: Cervix  Result Value Ref Range   Yeast Wet Prep HPF POC NONE SEEN NONE SEEN   Trich, Wet Prep NONE SEEN NONE SEEN   Clue Cells Wet Prep HPF POC NONE SEEN NONE SEEN   WBC, Wet Prep HPF POC FEW (A) NONE SEEN   Sperm NONE SEEN   CBC with Differential/Platelet     Status: Abnormal   Collection Time: 04/15/19 11:08 AM  Result Value Ref Range   WBC 8.5 4.0 - 10.5  K/uL   RBC 3.76 (L) 3.87 - 5.11 MIL/uL   Hemoglobin 10.7 (L) 12.0 - 15.0 g/dL   HCT 31.4 (L) 36.0 - 46.0 %   MCV 83.5 80.0 - 100.0 fL   MCH 28.5 26.0 - 34.0 pg   MCHC 34.1 30.0 - 36.0 g/dL   RDW 12.5 11.5 - 15.5 %   Platelets 265 150 - 400 K/uL   nRBC 0.0 0.0 - 0.2 %   Neutrophils Relative % 70 %   Neutro Abs 6.0 1.7 - 7.7 K/uL   Lymphocytes Relative 22 %   Lymphs Abs 1.9 0.7 - 4.0 K/uL   Monocytes Relative 6 %  Monocytes Absolute 0.5 0.1 - 1.0 K/uL   Eosinophils Relative 1 %   Eosinophils Absolute 0.1 0.0 - 0.5 K/uL   Basophils Relative 0 %   Basophils Absolute 0.0 0.0 - 0.1 K/uL   Immature Granulocytes 1 %   Abs Immature Granulocytes 0.09 (H) 0.00 - 0.07 K/uL  Urinalysis, Routine w reflex microscopic     Status: Abnormal   Collection Time: 04/15/19 11:30 AM  Result Value Ref Range   Color, Urine YELLOW YELLOW   APPearance HAZY (A) CLEAR   Specific Gravity, Urine 1.015 1.005 - 1.030   pH 5.0 5.0 - 8.0   Glucose, UA NEGATIVE NEGATIVE mg/dL   Hgb urine dipstick LARGE (A) NEGATIVE   Bilirubin Urine NEGATIVE NEGATIVE   Ketones, ur NEGATIVE NEGATIVE mg/dL   Protein, ur NEGATIVE NEGATIVE mg/dL   Nitrite NEGATIVE NEGATIVE   Leukocytes,Ua NEGATIVE NEGATIVE   RBC / HPF 21-50 0 - 5 RBC/hpf   WBC, UA 11-20 0 - 5 WBC/hpf   Bacteria, UA NONE SEEN NONE SEEN   Squamous Epithelial / LPF 0-5 0 - 5   Mucus PRESENT    [place] Mfm Ob Limited  Result Date: 04/15/2019 ----------------------------------------------------------------------  OBSTETRICS REPORT                       (Signed Final 04/15/2019 11:44 am) ---------------------------------------------------------------------- Patient Info  ID #:       [id number]                          D.O.B.:  03-18-1991 (27 yrs)  Name:       Brandi Andrews               Visit Date: 04/15/2019 11:32 am ---------------------------------------------------------------------- Performed By  Performed By:     Berlinda Last          Secondary Phy.:   MAU Nursing-                     RDMS                                                             MAU/Triage  Attending:        Tama High MD        [facility]  Referred By:      Darlina Rumpf ---------------------------------------------------------------------- Orders   #  Description                          Code         Ordered By   1  [place] MFM OB LIMITED  35329.92     Mallie Snooks  ----------------------------------------------------------------------   #  Order #                    Accession #                 Episode #   1  [id number]                  [id number]                  [id number]  ---------------------------------------------------------------------- Indications   [redacted] weeks gestation of pregnancy                Z3A.15   Vaginal bleeding in pregnancy, second          O46.92   trimester   Pelvic pain affecting pregnancy in second      O26.892   trimester (left sided)  ---------------------------------------------------------------------- Fetal Evaluation  Num Of Fetuses:         1  Fetal Heart Rate(bpm):  133  Cardiac Activity:       Observed  Presentation:           Cephalic  Placenta:               Posterior  P. Cord Insertion:      Visualized  Amniotic Fluid  AFI FV:      Within normal limits  Comment:    Small Small subchorionic hemorrhage noted measuring 2 x .3 x              .3 cm ---------------------------------------------------------------------- Biometry  BPD:      34.1  mm     G. Age:  16w 4d         95  % ---------------------------------------------------------------------- OB History  Gravidity:    2         Term:   0        Prem:   0        SAB:   1  TOP:          0       Ectopic:  0        Living: 0 ---------------------------------------------------------------------- Gestational Age  LMP:           15w  1d        Date:  12/30/18                 EDD:   10/06/19  U/S Today:     16w 4d                                        EDD:   09/26/19  Best:          15w 1d     Det. By:  LMP  (12/30/18)          EDD:   10/06/19 ---------------------------------------------------------------------- Anatomy  Stomach:               Appears normal, left   Bladder:  Appears normal                         sided ---------------------------------------------------------------------- Cervix Uterus Adnexa  Cervix  Closed.  Uterus  No abnormality visualized.  Left Ovary  Within normal limits. Small corpus luteum noted.  Right Ovary  Not visualized. No adnexal mass visualized.  Adnexa  No abnormality visualized. ---------------------------------------------------------------------- Impression  Patient was evaluated for c/o vaginal bleeding.  A limited ultrasound study was performed. Amniotic fluid is  normal and good fetal activity is seen. Placenta looks normal.  Placental lake is seen. ---------------------------------------------------------------------- Recommendations  -Detailed fetal anatomical survey scan in 2 weeks. ----------------------------------------------------------------------                  Tama High, MD Electronically Signed Final Report   04/15/2019 11:44 am ----------------------------------------------------------------------  Assessment and Plan  --28 y.o. G2P0010 at [redacted]w[redacted]d --[id number] by Doppler --Vaginal bleeding in setting of subchorionic hemorrhage --Reviewed pelvic rest, lifting restrictions --Discharge home in stable condition, return to MAU for worsening acuity  F/U: Next OB appt virtual appt 04/22/2019  SDarlina Rumpf CNM 04/15/2019, 1:00 PM

## 2019-04-15 NOTE — MAU Note (Signed)
Presents with c/o VB that began 1 hour ago.  Reports VB isn't bright red, noticed in underwear & with wiping.  Denies passing clots.  Denies recent intercourse.

## 2019-04-16 LAB — GC/CHLAMYDIA PROBE AMP (~~LOC~~) NOT AT ARMC
Chlamydia: NEGATIVE
Neisseria Gonorrhea: NEGATIVE

## 2019-04-19 ENCOUNTER — Telehealth (INDEPENDENT_AMBULATORY_CARE_PROVIDER_SITE_OTHER): Payer: Commercial Managed Care - PPO

## 2019-04-19 DIAGNOSIS — N939 Abnormal uterine and vaginal bleeding, unspecified: Secondary | ICD-10-CM

## 2019-04-19 NOTE — Telephone Encounter (Signed)
Pt called stating that she is having some vaginal bleeding and wanted to know if she should come in to the office or continue with a virtual visit.  Pt informed me that she is having bloody show.  I explained to the pt that it is okay to have bloody show especially with a subchronic hemorrhage.  I also explained to the that she may have the same type of bleeding after intercourse, cervical exam, or ultrasound check.  I advised to the pt that the provider will f/u with her at visit.  Pt verbalized understanding.

## 2019-04-21 ENCOUNTER — Telehealth: Payer: Self-pay | Admitting: Family Medicine

## 2019-04-21 NOTE — Telephone Encounter (Signed)
Called patient to see if she was plan on keeping her appt with our office tomorrow 04-22-2019 or was she transferring her care because we received a release form for [facility], there was no answer from the patient on the phone and was not able to leave a message

## 2019-04-22 ENCOUNTER — Other Ambulatory Visit: Payer: Self-pay

## 2019-04-22 ENCOUNTER — Telehealth: Payer: Commercial Managed Care - PPO | Admitting: Family Medicine

## 2019-04-22 ENCOUNTER — Telehealth: Payer: Self-pay | Admitting: Family Medicine

## 2019-04-22 NOTE — Progress Notes (Signed)
1:25p-called pt for My Chart visit, no answer, left VM that I will call her back in 10 to 15 minutes.    1:33,2nd attempt, no answer, left VM that appointment will have to be rescheduled.

## 2019-04-22 NOTE — Progress Notes (Signed)
Patient did not keep appointment today. She will be called to reschedule.  

## 2019-04-22 NOTE — Telephone Encounter (Signed)
Called patient this morning to see if she was transferring because we had received a request for her records. She stated she was still deciding on where she wanted to go. She stated [facility] not received everything they needed to get her started there. She stated she had to go to their office and sign some paperwork.

## 2019-04-27 ENCOUNTER — Telehealth: Payer: Self-pay | Admitting: Advanced Practice Midwife

## 2019-04-27 NOTE — Telephone Encounter (Signed)
The patient called in stating she requested to have her care transferred however she does not want to leave our office. If we receive a transfer of records please dont process.

## 2019-04-28 ENCOUNTER — Telehealth: Payer: Self-pay | Admitting: Family Medicine

## 2019-04-28 NOTE — Telephone Encounter (Signed)
The patient stated she received a message from [facility] about the blood pressure cuff not being processed due to the a request to switch clinics. She called in yesterday to deny the request. Please give patient a call back regarding the next steps in the order.    Thanks!

## 2019-04-29 NOTE — Telephone Encounter (Signed)
Called patient stating I am returning her phone call. Patient states she still hasn't received a BP cuff from [place]. Asked patient if she was planning on transferring or staying at our practice. Patient states she wishes to come here. Discussed with patient I will reach out to the pharmacy and let them know. Told patient she will need to answer their phone call and speak with them before a BP cuff will be shipped. Patient verbalized understanding & had no questions. [facility]- no answer. Left message with patient information and asked they call us back if they have questions

## 2019-05-04 ENCOUNTER — Telehealth: Payer: Self-pay | Admitting: Medical

## 2019-05-04 ENCOUNTER — Telehealth: Payer: Self-pay | Admitting: General Practice

## 2019-05-04 NOTE — Telephone Encounter (Signed)
Called the patient to pre-screen. Left a detailed voicemail informing if the patient is experiencing any flu-like symptoms such as fever, chills, cough, or shortness of breath and/or has been in contact with anyone who is suspected of/or confirmed to have COVID19 please call back to reschedule the appointment. Upon entering the office please wear a face mask, sanitize hands, and no visitors or children due to COVID19 restrictions. °

## 2019-05-04 NOTE — Telephone Encounter (Signed)
Patient called and left message on nurse voicemail line stating she has an ultrasound on 7/8 and wants to know if she can bring a visitor.   Called patient, no answer- unable to leave message as voicemail was full. Will send mychart message.

## 2019-05-05 ENCOUNTER — Other Ambulatory Visit (HOSPITAL_COMMUNITY): Payer: Self-pay | Admitting: *Deleted

## 2019-05-05 ENCOUNTER — Ambulatory Visit (HOSPITAL_COMMUNITY)
Admission: RE | Admit: 2019-05-05 | Discharge: 2019-05-05 | Disposition: A | Discharge status: Home / Self Care | Payer: Commercial Managed Care - PPO | Source: Ambulatory Visit | Attending: Obstetrics and Gynecology | Admitting: Obstetrics and Gynecology

## 2019-05-05 ENCOUNTER — Ambulatory Visit: Payer: Commercial Managed Care - PPO | Admitting: Obstetrics and Gynecology

## 2019-05-05 ENCOUNTER — Other Ambulatory Visit: Payer: Self-pay

## 2019-05-05 ENCOUNTER — Telehealth: Payer: Self-pay

## 2019-05-05 VITALS — BP 119/60 | HR 71 | Temp 98.6°F | Wt 245.5 lb

## 2019-05-05 DIAGNOSIS — Z3480 Encounter for supervision of other normal pregnancy, unspecified trimester: Secondary | ICD-10-CM

## 2019-05-05 DIAGNOSIS — O3412 Maternal care for benign tumor of corpus uteri, second trimester: Secondary | ICD-10-CM | POA: Diagnosis not present

## 2019-05-05 DIAGNOSIS — O99212 Obesity complicating pregnancy, second trimester: Secondary | ICD-10-CM

## 2019-05-05 DIAGNOSIS — Z3A18 18 weeks gestation of pregnancy: Secondary | ICD-10-CM | POA: Diagnosis not present

## 2019-05-05 DIAGNOSIS — D573 Sickle-cell trait: Secondary | ICD-10-CM

## 2019-05-05 DIAGNOSIS — O2340 Unspecified infection of urinary tract in pregnancy, unspecified trimester: Secondary | ICD-10-CM

## 2019-05-05 DIAGNOSIS — O99210 Obesity complicating pregnancy, unspecified trimester: Secondary | ICD-10-CM

## 2019-05-05 NOTE — Progress Notes (Signed)
Prenatal Visit Note Date: 05/05/2019 Clinic: [facility]  Subjective:  Brandi Andrews is a 28 y.o. G2P0010 at [redacted]w[redacted]d being seen today for ongoing prenatal care.  She is currently monitored for the following issues for this low-risk pregnancy and has BMI 40.0-44.9, adult ([place]); Cervical dysplasia; Noncompliance; Supervision of other normal pregnancy, antepartum; Subchorionic hematoma in second trimester; Sickle cell trait ([place]); and UTI in pregnancy, antepartum on their problem list.  Patient reports pink spotting two days ago. .   Contractions: Not present. Vag. Bleeding: Scant.  Movement: Present. Denies leaking of fluid.   The following portions of the patient's history were reviewed and updated as appropriate: allergies, current medications, past family history, past medical history, past social history, past surgical history and problem list. Problem list updated.  Objective:   Vitals:   05/05/19 1017  BP: 119/60  Pulse: 71  Temp: 98.6 F (37 C)  Weight: 245 lb 8 oz (111.4 kg)    Fetal Status: Fetal Heart Rate (bpm): 154   Movement: Present     General:  Alert, oriented and cooperative. Patient is in no acute distress.  Skin: Skin is warm and dry. No rash noted.   Cardiovascular: Normal heart rate noted  Respiratory: Normal respiratory effort, no problems with respiration noted  Abdomen: Soft, gravid, appropriate for gestational age. Pain/Pressure: Absent     Pelvic:  Cervical exam deferred        Extremities: Normal range of motion.  Edema: None  Mental Status: Normal mood and affect. Normal behavior. Normal judgment and thought content.   Urinalysis:      Assessment and Plan:  Pregnancy: G2P0010 at [redacted]w[redacted]d  1. Supervision of other normal pregnancy, antepartum Routine care. Declines genetics. F/u anatomy u/s read. CL and placenta look normal - Culture, OB Urine  2. Sickle cell trait ([place]) Likely why having recurrent utis. Offered tx and suppression  vs toc today. Pt elects latter Pt not aware. D/w her re: this. States fob is neg. I told her all newborns are tested anyways.   3. UTI in pregnancy, antepartum - Culture, OB Urine  Preterm labor symptoms and general obstetric precautions including but not limited to vaginal bleeding, contractions, leaking of fluid and fetal movement were reviewed in detail with the patient. Please refer to After Visit Summary for other counseling recommendations.  Return in about 4 weeks (around 06/02/2019) for rob.   Aletha Halim, MD

## 2019-05-05 NOTE — Progress Notes (Signed)
Some bleeding when she wiped only.

## 2019-05-05 NOTE — Telephone Encounter (Signed)
Talked with Pt during visit, she stated she still has not received her BP Cuff. So I called [facility], and they stated they did not have pt in system. So asked Registrar to resubmit order that was placed in May.

## 2019-05-07 LAB — CULTURE, OB URINE

## 2019-05-07 LAB — URINE CULTURE, OB REFLEX

## 2019-05-11 ENCOUNTER — Telehealth: Payer: Self-pay | Admitting: General Practice

## 2019-05-11 NOTE — Telephone Encounter (Signed)
Patient called and left message on nurse voicemail line requesting u/s results & urine results. Called patient, no answer- left message stating we are trying to reach her to return her phone call. Her most recent results/tests were negative/normal. She may call us back if she has other questions/concerns.

## 2019-05-17 ENCOUNTER — Telehealth (INDEPENDENT_AMBULATORY_CARE_PROVIDER_SITE_OTHER): Payer: Commercial Managed Care - PPO | Admitting: General Practice

## 2019-05-17 DIAGNOSIS — Z3480 Encounter for supervision of other normal pregnancy, unspecified trimester: Secondary | ICD-10-CM

## 2019-05-17 NOTE — Telephone Encounter (Signed)
Called patient regarding mychart message concerns. Patient states she had an ultrasound where they saw a myoma and no one told her what that is. Told patient that is a fibroid & explained fibroids to patient. Discussed it looks like she has had this one for a while and it is very small, definitely nothing to be concerned about especially in regards to her pregnancy. Patient verbalized understanding. Patient states she saw obesity on her problem list and is worried about the risks of that to her pregnancy. She states she tries to eat healthy and was told she previously couldn't work out due to the hemorrhage. Discussed with patient it does come with some risks compared to pregnancies in which women are not and advised she discuss those concerns with Heather at her next appt. Discussed we always advise pregnant women to eat healthy, a well balanced meal, decreasing carbohydrate & sugar intake and exercising. Told patient she can exercise now and to take things easy. Discussed if she wasn't doing a particular exercise before pregnancy I wouldn't recommend she start a new one now. Told patient it take it easy and even 30 minutes of walking a day would count towards her exercise. Patient verbalized understanding. Asked patient if she was seeing another healthcare provider and she states no. Patient reports she was looking for another provider so she doesn't keep seeing a different person but couldn't find anyone that will accept her insurance or they tell her she is too late. Patient states she understands we rotate through different providers because the delivery person cannot be guaranteed but she states in seeing so many different people, she doesn't know who to contact if she has questions or concerns. Discussed with patient if she decides she wants to continue seeing Heather after her visit on 8/5, she can definitely request that whenever her next appointment is made. Told patient she can always speak with [place] the  nursing staff with questions she has & we always try to answer them appropriately. Told patient if we are ever not sure how to answer, we always reach out to her provider for clarification. Patient verbalized understanding and states all of her questions have been answered at this time. Patient had no other questions.

## 2019-05-18 ENCOUNTER — Telehealth: Payer: Self-pay

## 2019-05-18 NOTE — Telephone Encounter (Signed)
Pt called and stated that she is not having the pressure that she had before does she need to go to the hospital.  Called pt and informed pt that she can have that pressure off and on throughout pregnancy and its normal.  Pt verbalized understanding.

## 2019-05-28 ENCOUNTER — Encounter

## 2019-06-02 ENCOUNTER — Encounter: Payer: Self-pay | Admitting: Advanced Practice Midwife

## 2019-06-02 ENCOUNTER — Other Ambulatory Visit (HOSPITAL_COMMUNITY): Payer: Self-pay | Admitting: *Deleted

## 2019-06-02 ENCOUNTER — Ambulatory Visit (INDEPENDENT_AMBULATORY_CARE_PROVIDER_SITE_OTHER): Payer: Commercial Managed Care - PPO | Admitting: Advanced Practice Midwife

## 2019-06-02 ENCOUNTER — Ambulatory Visit (HOSPITAL_COMMUNITY)
Admission: RE | Admit: 2019-06-02 | Discharge: 2019-06-02 | Disposition: A | Discharge status: Home / Self Care | Payer: Commercial Managed Care - PPO | Source: Ambulatory Visit | Attending: Obstetrics and Gynecology | Admitting: Obstetrics and Gynecology

## 2019-06-02 ENCOUNTER — Other Ambulatory Visit: Payer: Self-pay

## 2019-06-02 ENCOUNTER — Encounter: Payer: Self-pay | Admitting: *Deleted

## 2019-06-02 VITALS — BP 111/69 | HR 79 | Temp 98.6°F | Wt 253.4 lb

## 2019-06-02 DIAGNOSIS — D573 Sickle-cell trait: Secondary | ICD-10-CM

## 2019-06-02 DIAGNOSIS — Z362 Encounter for other antenatal screening follow-up: Secondary | ICD-10-CM

## 2019-06-02 DIAGNOSIS — Z3480 Encounter for supervision of other normal pregnancy, unspecified trimester: Secondary | ICD-10-CM

## 2019-06-02 DIAGNOSIS — O99210 Obesity complicating pregnancy, unspecified trimester: Secondary | ICD-10-CM

## 2019-06-02 DIAGNOSIS — O99212 Obesity complicating pregnancy, second trimester: Secondary | ICD-10-CM

## 2019-06-02 DIAGNOSIS — Z3A22 22 weeks gestation of pregnancy: Secondary | ICD-10-CM | POA: Diagnosis not present

## 2019-06-02 DIAGNOSIS — O99012 Anemia complicating pregnancy, second trimester: Secondary | ICD-10-CM

## 2019-06-02 NOTE — Patient Instructions (Signed)
AREA PEDIATRIC/FAMILY PRACTICE PHYSICIANS  [place] ([address]) . [facility] o Chambliss, MD; Eniola, MD; Hale, MD; Hensel, MD; McDiarmid, MD; McIntyer, MD; Neal, MD; Walden, MD o [address] [place], [place] [address] o [phone] o Mon-Fri 8:30-12:30, 1:30-5:00 o Providers come to see babies at [facility] o Accepting Medicaid . [facility] o Limited providers who accept newborns: Koirala, MD; Morrow, MD; Wolters, MD o [address], [place], [place] [address] o [phone] o Mon-Fri 8:00-5:30 o Babies seen by providers at [facility] o Does NOT accept Medicaid o Please call early in hospitalization for appointment (limited availability)  . [facility] o Mulberry, MD o [address] [place], [place] [address] o [phone] o Mon, Tue, Thur, Fri 8:30-5:00, Wed 10:00-7:00 (closed 1-2pm) o Babies seen by [facility] providers o Accepting Medicaid . [facility] Pediatrician o Rubin, MD o [address], [place], [place] [address] o [phone] o Mon-Fri 8:30-5:00, Sat 8:30-12:00 o Provider comes to see babies at [facility] o Accepting Medicaid o Must have been referred from current patients or contacted office prior to delivery . [facility] ([facility]) o Brown, MD; Chandler, MD; Ettefagh, MD; Grant, MD; Lester, MD; McCormick, MD; McQueen, MD; Prose, MD; Simha, MD; Stanley, MD; Stryffeler, NP; Tebben, NP o [address], [place], [place] [address] o [phone] o Mon, Tue, Thur, Fri 8:30-5:30, Wed 9:30-5:30, Sat 8:30-12:30 o Babies seen by [facility] providers o Accepting Medicaid o Only accepting infants of first-time parents or siblings of current patients o Hospital discharge coordinator will make follow-up appointment . Jack Amos o [address],  [place], [place]  [address] o [phone]   Fax - [phone] . [facility] o [address], [place], [place]  [address] o Phone - [phone]   Fax - [phone] . Shilpa Gosrani o [address], [place], [place]  [address] o [phone]  [place] ([address]) . [facility] o Bates, MD; [place], MD; Cooper, Cox, MD; MD; Davis, MD; Dovico, MD; Ettefaugh, MD; Little, MD; Lowe, MD; Keiffer, MD; Melvin, MD; Sumner, MD; Williams, MD o [address], [place], [place] [address] o [phone] o Mon-Fri 8:30-5:00 (extended evenings Mon-Thur as needed), Sat-Sun 10:00-1:00 o Providers come to see babies at [facility] o Accepting Medicaid for families of first-time babies and families with all children in the household age 3 and under. Must register with office prior to making appointment (M-F only). . [facility] o Henson, NP; Knapp, MD; Lalonde, MD; Tysinger, PA o [address] [place], [place] [address] o [phone] o Mon-Fri 8:00-5:00 o Babies seen by providers at [facility] o Does NOT accept Medicaid/Commercial Insurance Only . [facility] ([facility])  o Artis, MD; Barnes, MD; Bratton, MD; Coccaro, MD; [place] Gardner, MD; Kramer, MD; Marshall, MD; Netherton, MD; Poleto, MD; Skinner, MD o [address] [place], [place] [address] o [phone] o Mon-Fri 8:30-5:30, Sat (Oct.-Mar.) 9:00-1:00 o Babies seen by providers at [facility] o Accepting Medicaid  [place] ([address]) . [facility] o Reid, MD; Warner, MD o [address], [place], [place] [address] o [phone] o Mon-Fri 8:30-5:00, Sat 8:30-12:00 o Providers come to see babies at [facility] o Does NOT accept Medicaid . [facility] o Becker, PA; Hagler, MD; Scifres, PA; Sun, MD; Swayne, MD o [address],  [place], [place] [address] o [phone] o Mon-Fri 8:00-5:00 o Babies seen by providers at [facility] o Does NOT accept Medicaid o Only accepting babies of parents who are patients o Please call early in hospitalization for appointment (limited availability) . [facility] o Clark, MD; Frye, MD; Kelleher, MD; Mack, NP; Miller, MD; O'Keller, MD; Patterson, NP; Pudlo, MD; Puzio, MD; Thomas, MD; Tucker, MD; Twiselton, MD o [address], [place], [place] [address] o [phone] o Mon-Fri 8:00-5:00, Sat 9:00-12:00 o Providers come to see babies at [facility] o Does NOT accept Medicaid  [place] ([address]) . [facility] o Limited providers accepting new patients: Brake, NP; Wharton, PA o [address], [place], [place] [address] o [phone] o Mon-Fri 8:00-5:00 o Babies seen by providers at [facility] o Does NOT accept Medicaid o Only accepting babies of parents who are patients o Please call early in hospitalization for appointment (limited availability) . [facility] o Gay, MD; Quinlan, MD o [address] [place], [place] [address] o [phone] (press 1 to schedule appointment) o Mon-Fri 8:00-5:00 o Providers come to see babies at [facility] o Does NOT accept Medicaid . [facility] o Mazer, MD o [address] [place], [place] [address] o [phone] o Mon-Fri 8:30-5:00 (lunch 12:30-1:00), extended hours by appointment only Wed 5:00-6:30 o Babies seen by [facility] providers o Accepting Medicaid . [facility] o Banks, MD; Jordan, MD; Koberlein, MD o [address], [place], [place] [address] o [phone] o Mon-Fri 8:00-5:00 o Babies seen by [facility] providers o Does NOT accept Medicaid . [facility] o Parker, MD; Hunter, MD; Wallace, DO o [address] [place], [place]  [address] o [phone] o Mon-Fri 8:00-5:00 o Babies seen by [facility] providers o Does NOT accept Medicaid . [facility] o Brandon, PA; Brecken, PA; Christy, NP; Dees, MD; DeClaire, MD; DeWeese, MD; Hansen, NP; Mills, NP; Parrish, NP; Smoot, NP; Summer, MD; Vapne, MD o [address] [place], [place] [address] o [phone] o Mon-Fri 8:30-5:00, Sat 10:00-1:00 o Providers come to see babies at [facility] o Does NOT accept Medicaid o Free prenatal information session Tuesdays at 4:45pm . [facility] o Bouska, MD; Gordon, PA; Jeffery, PA; Weber, PA o [address] [place] [address] o [phone] o Mon-Fri 7:30-5:30 o Babies seen by [facility] providers . [facility] o [address], [place], [place]  [address] o [phone]   Fax - [phone]  [place] ([phone] & [phone]) . [facility] o [place]ese, [place] o [address] [place], [place] [address] o [phone] o Mon-Thur 8:00-6:00 o Providers come to see babies at [facility] o Accepting Medicaid . [facility] o Anderson, NP; Badger, MD; Beal, PA; Spencer, PA o [address] [place], [place] [address] o [phone] o Mon-Thur 7:30-7:30, Fri 7:30-4:30 o Babies seen by [facility] providers o Accepting Medicaid . [facility] o Agbuya, MD; Klett, NP; Romgoolam, MD o [address], [place], [place] [address] o [phone] o Mon-Fri 8:30-5:00, Sat 8:30-12:00 o Providers come to see babies at [facility] o Accepting Medicaid o Must have "Meet & Greet" appointment at office prior to delivery . [facility] ) o McCord,   MD; Juleen China, MD; Clydene Laming, [address], [place], [place] [address] o [phone] o Mon-Wed 8:00-6:00, Thur-Fri 8:00-5:00, Sat 9:00-12:00 o Providers come to  see babies at [facility] o Does NOT accept Medicaid o Only accepting siblings of current patients . [facility] [place], [place], [place], [place]  [address] o [phone]   Fax [phone] . [facility] [place] [address], [place], [place]  [address] o [phone]   Fax - Morton Gorman [phone] & [phone]) . Therapist, music at [place], DO; [place], [address] [place], [place] [address] o [phone] o Mon-Fri 7:00-5:00 o Babies seen by [facility] providers o Does NOT accept Medicaid . [place], MD; [place], [place]; [place], [place], [place], [place] [address] o [phone] o Mon-Fri 8:00-5:00 o Babies seen by [facility] providers o Accepting Medicaid . [place], MD; [place], [place]; [place], NP; [place], [place], [place], [place] [address] o [phone] o Mon-Fri 8:00-5:00 o Babies seen by providers at [facility] [place] [phone]) . [facility] [place], [place] o [address] [place], [place] [address] o [phone] o Mon-Fri 8:00-5:00 o Babies seen by [facility] providers o Does NOT accept Medicaid o Limited availability, please call early in hospitalization to schedule follow-up . [facility] Leilani Merl, PA; Maisie Fus, MD; [place], MD; [place], [place]; Jeannine Kitten, MD; [place], [place] [facility] [address], [place], [place] [address] o [phone] o Mon-Fri 8:30-5:00, Sat 9:00-12:00 o Babies seen by providers at [facility] o Accepting Medicaid o Please register online then schedule online or call office o [email or web address] . [place] [facility]  [place]) Kristian Covey, NP; Dwyane Dee, MD; Leonidas Romberg, PA o [address] [place], [place], [place] [address] o [phone] o Mon-Fri 8:00-5:00 o Babies seen by providers at [facility] o Accepting Medicaid . [place] ([facility]) Dairl Ponder, MD; Rayvon Char, NP; Melina Modena, MD o [address] [place], [place], [place] [address] o [phone] o Mon-Fri 8:00-5:30, Sat&Sun by appointment (phones open at 8:30) o Babies seen by [facility] providers o Accepting Medicaid o Must be a first-time baby or sibling of current patient . [place], [address], [place], [place]  [address] o [phone]   Fax - [phone]  [place] [phone] & [phone]) . [place], [place]; [place], [place]; Benjamine Mola, MD; [place], [place]; Harrell Lark, MD o [address] [place], [place] [address] o [phone] o Mon-Thur 8:00-7:00, Fri 8:00-5:00, Sat 8:00-12:00, Sun 9:00-12:00 o Babies seen by [facility] providers o Accepting Medicaid . [facility], MD; Ruthann Cancer, MD; [facility], MD o [address], [place], [place] [address] o [phone] o Mon-Thur 8:00-5:00 o Babies seen by providers at [facility] o Accepting Medicaid . [facility] [place], MD; Coe[place]Go[place], MD; Amedeo Plenty, MD; Bobby Rumpf, MD; List, MD; Lavonia Drafts, MD; Ruthann Cancer, MD; Selinda Eon, MD; Audie Box, MD; Jim Like, MD; Christie Nottingham, MD; Hubbard Hartshorn, MD; Modena Nunnery, MD o [address] [place], [place]  [address] o [phone] o Mon-Fri 8:00-5:30, Sat (Oct.-Mar.) 9:00-1:00 o Babies seen by providers at [facility] o Accepting Medicaid o Must fill out new patient packet, available online at [email or web address] . [place] ([facility]) Barnabas Lister, NP; [place] Kingfisher, NP; Claiborne Billings, NP; Rolla Plate, MD;  [place], [place]; [place]ola Rhine, MD; Tyron Russell, MD; Delia Chimes, NP o [address], [place], [place] [address] o [phone] o Mon-Thur 8:00-5:30, Fri 8:00-5:00 o Babies seen by providers at [place] [phone]) . [place], [place]; Moffett, MD; Dennard Schaumann, MD; [place], [place] o [address] [place], [place] [address] o [phone] o Mon-Fri 8:00-5:00 o Babies seen by providers at [place] [phone]) . [place] [facility] [place], [place]; Olen Pel, MD; [place], [place], [place], [place] [address] o [phone] o Mon-Fri 8:00-5:00 o Babies seen by providers at [facility] o Does NOT accept Medicaid o Limited appointment availability, please call early in hospitalization  . Therapist, music at [place], [place]; [place], [address], [place], [place] [address] o [phone] o Mon-Fri 8:00-5:00 o Babies seen by [facility] providers o Does NOT accept Medicaid . [facility], MD; Guy Sandifer, MD; [place], [place]; [place], [address], [place], [place] [address] o [phone] o Mon-Fri 8:00-5:00 o After hours clinic [facility] [place], [place] [address]) [phone] Mon-Fri 5:00-8:00, Sat 12:00-6:00, Sun 10:00-4:00 o Babies seen by [facility] providers o Accepting Medicaid . [place] [facility] o [address] [place], [place]  [address] o [phone]   Fax - [phone]  [place] [phone]) . Therapist, music at [facility], MD o [address] [place], [place], [place] [address] o [phone] o Mon-Fri 8:00-5:00 o Babies seen by [facility] providers o Does NOT accept Medicaid . [place] ([place] [facility] [place]) Bing Neighbors, MD o [address] [place], [place],   [address] o [phone] o Mon-Thur 8:00-7:00, Fri 8:00-5:00, Sat 8:00-12:00 o Babies seen by providers at [facility] o Accepting Medicaid - but does not have vaccinations in office (must be received elsewhere) o Limited availability, please call early in hospitalization  [place] ([address]) . [place], MD o [address], [place] [address] o [phone]  Fax [phone]  Deciding about Circumcision in Baby Boys  (The Basics)  What is circumcision?  Circumcision is a surgery that removes the skin that covers the tip of the penis, called the "foreskin" Circumcision is usually done when a boy is between 41 and 47 days old. In the [place], circumcision is common. In some other countries, fewer boys are circumcised. Circumcision is a common tradition in some religions.  Should I have my baby boy circumcised?  There is no easy answer. Circumcision has some benefits. But it also has risks. After talking with your doctor, you will have to decide for yourself what is right for your family.  What are the benefits of circumcision?  Circumcised boys seem to have slightly lower rates of: ?Urinary tract infections ?Swelling of the opening at the tip of the penis Circumcised men seem to have slightly lower rates of: ?Urinary tract infections ?Swelling of the opening at the tip of the penis ?  Penis cancer ?HIV and other infections that you catch during sex ?Cervical cancer in the women they have sex with Even so, in the [place], the risks of these problems are small - even in boys and men who have not been circumcised. Plus, boys and men who are not circumcised can reduce these extra risks by: ?Cleaning their penis well ?Using condoms during sex  What are the risks of circumcision?  Risks include: ?Bleeding or infection from the surgery ?Damage to or amputation of the penis ?A chance that the doctor will cut off too much or not  enough of the foreskin ?A chance that sex won't feel as good later in life Only about 1 out of every 200 circumcisions leads to problems. There is also a chance that your health insurance won't pay for circumcision.  How is circumcision done in baby boys?  First, the baby gets medicine for pain relief. This might be a cream on the skin or a shot into the base of the penis. Next, the doctor cleans the baby's penis well. Then he or she uses special tools to cut off the foreskin. Finally, the doctor wraps a bandage (called gauze) around the baby's penis. If you have your baby circumcised, his doctor or nurse will give you instructions on how to care for him after the surgery. It is important that you follow those instructions carefully.

## 2019-06-02 NOTE — Progress Notes (Signed)
   PRENATAL VISIT NOTE  Subjective:  Brandi Andrews is a 28 y.o. G2P0010 at [redacted]w[redacted]d being seen today for ongoing prenatal care.  She is currently monitored for the following issues for this low-risk pregnancy and has BMI 40.0-44.9, adult ([place]); Cervical dysplasia; Noncompliance; Supervision of other normal pregnancy, antepartum; Subchorionic hematoma in second trimester; Sickle cell trait ([place]); and UTI in pregnancy, antepartum on their problem list.  Patient reports no complaints.  Contractions: Not present. Vag. Bleeding: None.  Movement: Present. Denies leaking of fluid.   The following portions of the patient's history were reviewed and updated as appropriate: allergies, current medications, past family history, past medical history, past social history, past surgical history and problem list.   Objective:   Vitals:   06/02/19 0829  BP: 111/69  Pulse: 79  Temp: 98.6 F (37 C)  Weight: 253 lb 6.4 oz (114.9 kg)    Fetal Status: Fetal Heart Rate (bpm): 149   Movement: Present     General:  Alert, oriented and cooperative. Patient is in no acute distress.  Skin: Skin is warm and dry. No rash noted.   Cardiovascular: Normal heart rate noted  Respiratory: Normal respiratory effort, no problems with respiration noted  Abdomen: Soft, gravid, appropriate for gestational age.  Pain/Pressure: Absent     Pelvic: Cervical exam deferred        Extremities: Normal range of motion.  Edema: None  Mental Status: Normal mood and affect. Normal behavior. Normal judgment and thought content.   Assessment and Plan:  Pregnancy: G2P0010 at [redacted]w[redacted]d 1. Sickle cell trait in mother affecting pregnancy ([place]) - Culture, OB Urine  2. Supervision of other normal pregnancy, antepartum - CBC; Future - HIV Antibody (routine testing w rflx); Future - RPR; Future - Glucose Tolerance, 2 Hours w/1 Hour; Future - BP cuff arrived on 06/02/2019, ok to start virtual visits after 28 week visit  - AFP/Panorama today  - Horizon today    Preterm labor symptoms and general obstetric precautions including but not limited to vaginal bleeding, contractions, leaking of fluid and fetal movement were reviewed in detail with the patient. Please refer to After Visit Summary for other counseling recommendations.   Return in about 6 weeks (around 07/14/2019) for 28 Week labs and GTT.  Future Appointments  Date Time Provider [place]  06/02/2019  8:45 AM WH-MFC [place] 2 WH-MFCUS MFC-US    Marcille Buffy DNP, CNM  06/02/19  8:36 AM

## 2019-06-04 LAB — AFP, SERUM, OPEN SPINA BIFIDA
AFP MoM: 1.04
AFP Value: 61.1 ng/mL
Gest. Age on Collection Date: 22 weeks
Maternal Age At EDD: 30.1 yr
OSBR Risk 1 IN: 10000
Test Results:: NEGATIVE
Weight: 253 [lb_av]

## 2019-06-04 LAB — CULTURE, OB URINE

## 2019-06-04 LAB — URINE CULTURE, OB REFLEX

## 2019-07-01 ENCOUNTER — Encounter (HOSPITAL_COMMUNITY): Payer: Self-pay

## 2019-07-01 ENCOUNTER — Ambulatory Visit (HOSPITAL_COMMUNITY): Payer: Commercial Managed Care - PPO | Admitting: *Deleted

## 2019-07-01 ENCOUNTER — Other Ambulatory Visit (HOSPITAL_COMMUNITY): Payer: Self-pay | Admitting: *Deleted

## 2019-07-01 ENCOUNTER — Other Ambulatory Visit: Payer: Self-pay

## 2019-07-01 ENCOUNTER — Ambulatory Visit (HOSPITAL_COMMUNITY)
Admission: RE | Admit: 2019-07-01 | Discharge: 2019-07-01 | Disposition: A | Discharge status: Home / Self Care | Payer: Commercial Managed Care - PPO | Source: Ambulatory Visit | Attending: Obstetrics and Gynecology | Admitting: Obstetrics and Gynecology

## 2019-07-01 VITALS — BP 127/62 | HR 86 | Temp 98.6°F

## 2019-07-01 DIAGNOSIS — O99212 Obesity complicating pregnancy, second trimester: Secondary | ICD-10-CM

## 2019-07-01 DIAGNOSIS — O99210 Obesity complicating pregnancy, unspecified trimester: Secondary | ICD-10-CM

## 2019-07-01 DIAGNOSIS — Z3480 Encounter for supervision of other normal pregnancy, unspecified trimester: Secondary | ICD-10-CM | POA: Diagnosis present

## 2019-07-01 DIAGNOSIS — O359XX0 Maternal care for (suspected) fetal abnormality and damage, unspecified, not applicable or unspecified: Secondary | ICD-10-CM

## 2019-07-01 DIAGNOSIS — Z3A26 26 weeks gestation of pregnancy: Secondary | ICD-10-CM

## 2019-07-01 DIAGNOSIS — Z362 Encounter for other antenatal screening follow-up: Secondary | ICD-10-CM | POA: Diagnosis not present

## 2019-07-01 DIAGNOSIS — O3412 Maternal care for benign tumor of corpus uteri, second trimester: Secondary | ICD-10-CM

## 2019-07-06 ENCOUNTER — Inpatient Hospital Stay (HOSPITAL_COMMUNITY)
Admission: AD | Admit: 2019-07-06 | Discharge: 2019-07-06 | Disposition: A | Discharge status: Home / Self Care | Payer: Commercial Managed Care - PPO | Attending: Obstetrics and Gynecology | Admitting: Obstetrics and Gynecology

## 2019-07-06 ENCOUNTER — Encounter (HOSPITAL_COMMUNITY): Payer: Self-pay

## 2019-07-06 ENCOUNTER — Other Ambulatory Visit: Payer: Self-pay

## 2019-07-06 ENCOUNTER — Telehealth: Payer: Self-pay | Admitting: *Deleted

## 2019-07-06 DIAGNOSIS — Z3A26 26 weeks gestation of pregnancy: Secondary | ICD-10-CM | POA: Diagnosis not present

## 2019-07-06 DIAGNOSIS — Z8249 Family history of ischemic heart disease and other diseases of the circulatory system: Secondary | ICD-10-CM | POA: Diagnosis not present

## 2019-07-06 DIAGNOSIS — Z87891 Personal history of nicotine dependence: Secondary | ICD-10-CM | POA: Diagnosis not present

## 2019-07-06 DIAGNOSIS — Z833 Family history of diabetes mellitus: Secondary | ICD-10-CM | POA: Insufficient documentation

## 2019-07-06 DIAGNOSIS — Z880 Allergy status to penicillin: Secondary | ICD-10-CM | POA: Insufficient documentation

## 2019-07-06 DIAGNOSIS — Z803 Family history of malignant neoplasm of breast: Secondary | ICD-10-CM | POA: Diagnosis not present

## 2019-07-06 DIAGNOSIS — R519 Headache, unspecified: Secondary | ICD-10-CM

## 2019-07-06 DIAGNOSIS — R51 Headache: Secondary | ICD-10-CM | POA: Diagnosis not present

## 2019-07-06 DIAGNOSIS — O162 Unspecified maternal hypertension, second trimester: Secondary | ICD-10-CM

## 2019-07-06 DIAGNOSIS — O26892 Other specified pregnancy related conditions, second trimester: Secondary | ICD-10-CM

## 2019-07-06 DIAGNOSIS — Z3480 Encounter for supervision of other normal pregnancy, unspecified trimester: Secondary | ICD-10-CM

## 2019-07-06 DIAGNOSIS — R030 Elevated blood-pressure reading, without diagnosis of hypertension: Secondary | ICD-10-CM | POA: Diagnosis not present

## 2019-07-06 LAB — COMPREHENSIVE METABOLIC PANEL
ALT: 20 U/L (ref 0–44)
AST: 22 U/L (ref 15–41)
Albumin: 3.2 g/dL — ABNORMAL LOW (ref 3.5–5.0)
Alkaline Phosphatase: 68 U/L (ref 38–126)
Anion gap: 10 (ref 5–15)
BUN: <5 mg/dL — ABNORMAL LOW (ref 6–20)
CO2: 20 mmol/L — ABNORMAL LOW (ref 22–32)
Calcium: 8.7 mg/dL — ABNORMAL LOW (ref 8.9–10.3)
Chloride: 106 mmol/L (ref 98–111)
Creatinine, Ser: 0.63 mg/dL (ref 0.44–1.00)
GFR calc Af Amer: >60 mL/min (ref >60)
GFR calc non Af Amer: >60 mL/min (ref >60)
Glucose, Bld: 74 mg/dL (ref 70–99)
Potassium: 3.7 mmol/L (ref 3.5–5.1)
Sodium: 136 mmol/L (ref 135–145)
Total Bilirubin: 0.4 mg/dL (ref 0.3–1.2)
Total Protein: 6.9 g/dL (ref 6.5–8.1)

## 2019-07-06 LAB — CBC
HCT: 30.5 % — ABNORMAL LOW (ref 36.0–46.0)
Hemoglobin: 10.5 g/dL — ABNORMAL LOW (ref 12.0–15.0)
MCH: 29.4 pg (ref 26.0–34.0)
MCHC: 34.4 g/dL (ref 30.0–36.0)
MCV: 85.4 fL (ref 80.0–100.0)
Platelets: 272 K/uL (ref 150–400)
RBC: 3.57 MIL/uL — ABNORMAL LOW (ref 3.87–5.11)
RDW: 13 % (ref 11.5–15.5)
WBC: 12.4 K/uL — ABNORMAL HIGH (ref 4.0–10.5)
nRBC: 0 % (ref 0.0–0.2)

## 2019-07-06 LAB — PROTEIN / CREATININE RATIO, URINE
Creatinine, Urine: 30.44 mg/dL
Total Protein, Urine: <6 mg/dL

## 2019-07-06 MED ORDER — ACETAMINOPHEN 500 MG PO TABS
1000.0000 mg | ORAL_TABLET | Freq: Once | ORAL | Status: AC
  Administered 2019-07-06: 1000 mg via ORAL
  Filled 2019-07-06: qty 2

## 2019-07-06 NOTE — MAU Note (Signed)
Pt had called clinic with elevated BP and headaches.  Sent in for eval.  Denies visual changes, epigastric pain, reports some swelling, "the normal".

## 2019-07-06 NOTE — MAU Provider Note (Signed)
Chief Complaint:  Hypertension and Headache   First Provider Initiated Contact with Patient 07/06/19 1708      HPI: Brandi Andrews is Brandi 28 y.o. G2P0010 at 47w6dwho presents to maternity admissions reporting headache and elevated blood pressure. Patient has been checking her BP's at home daily and this morning she had readings of 141/82 and 130/94 on repeat. She does not normally have headaches but reports having them more in pregnancy which she attributes to wearing Brandi mask. Today her headache has been bilateral and worsened some by light. She has not taken anything for pain. Rating pain at 5/10. Also reports some mild nausea today without vomiting. Declines any medication for nausea and has been tolerating Brandi normal diet today. Denies vision changes, RUQ pain, SOB, chest pain, ctx, LOF, decreased fetal movement. She reports good fetal movement, denies LOF, vaginal bleeding, vaginal itching/burning, urinary symptoms, dizziness, vomiting, or fever/chills.    Past Medical History: Past Medical History:  Diagnosis Date  . Medical history non-contributory   . Obesity   . Ovarian cyst     Past obstetric history: OB History  Gravida Para Term Preterm AB Living  2       1 0  SAB TAB Ectopic Multiple Live Births  1       0    # Outcome Date GA Lbr Len/2nd Weight Sex Delivery Anes PTL Lv  2 Current           1 SAB 2012            Obstetric Comments  G1: 2012 early SAB    Past Surgical History: Past Surgical History:  Procedure Laterality Date  . EYE SURGERY    . LAPAROSCOPIC SALPINGO OOPHERECTOMY Right 03/03/2018   Procedure: LAPAROSCOPIC SALPINGO OOPHORECTOMY;  Surgeon: Brandi Oman [facility];  [facility];  Service: Gynecology;  Laterality: Right;    Family History: Family History  Problem Relation Age of Onset  . Diabetes Mother   . Breast cancer Mother 558 . Hypertension Father     Social History: Social History   Tobacco Use  . Smoking status: Former Smoker     Packs/day: 0.10    Years: 4.00    Pack years: 0.40    Types: Cigarettes  . Smokeless tobacco: Never Used  Substance Use Topics  . Alcohol use: Not Currently  . Drug use: No    Allergies:  Allergies  Allergen Reactions  . Mushroom Extract Complex Swelling and Other (See Comments)    Lips swell and patient gets dizzy  . Penicillins Other (See Comments)    Was told from childhood: Has patient had Brandi PCN reaction causing immediate rash, facial/tongue/throat swelling, SOB or lightheadedness with hypotension: Unknown Has patient had Brandi PCN reaction causing severe rash involving mucus membranes or skin necrosis: Unknown Has patient had Brandi PCN reaction that required hospitalization: Unknown Has patient had Brandi PCN reaction occurring within the last 10 years: No If all of the above answers are "NO", then may proceed with Cephalosporin use.     Meds:  Medications Prior to Admission  Medication Sig Dispense Refill Last Dose  . AMBULATORY NON FORMULARY MEDICATION 1 Device by Other route once Brandi week. Blood Pressure Cuff Large Monitored Regularly at home ICD 10:Z34.90 1 kit 0   . Prenatal Vit-Fe Fumarate-FA (PRENATAL PO) Take by mouth.       ROS:  Review of Systems All other systems negative unless noted above in HPI.   I  have reviewed patient's Past Medical Hx, Surgical Hx, Family Hx, Social Hx, medications and allergies.   Physical Exam   Patient Vitals for the past 24 hrs:  BP Temp Temp src Pulse Resp SpO2  07/06/19 1800 113/69 - - 83 - 100 %  07/06/19 1745 113/71 - - 92 - 100 %  07/06/19 1731 124/66 - - 82 - -  07/06/19 1650 121/70 98.6 F (37 C) Oral 90 18 97 %   Constitutional: Well-developed, well-nourished female in no acute distress. Obese. Cardiovascular: normal rate and rhythm  Respiratory: normal effort GI: Abd soft, non-tender, gravid appropriate for gestational age.  MS: Extremities nontender, no edema, normal ROM Neurologic: Alert and oriented x 4; grossly  intact GU: Neg CVAT.    FHT:  Baseline 145; appropriate for gestational age; no variables or decelerations  Contractions: None   Labs: Results for orders placed or performed during the hospital encounter of 07/06/19 (from the past 24 hour(s))  Protein / creatinine ratio, urine     Status: None   Collection Time: 07/06/19  4:37 PM  Result Value Ref Range   Creatinine, Urine 30.44 mg/dL   Total Protein, Urine <6 mg/dL   Protein Creatinine Ratio        0.00 - 0.15 mg/mg[Cre]  CBC     Status: Abnormal   Collection Time: 07/06/19  5:00 PM  Result Value Ref Range   WBC 12.4 (H) 4.0 - 10.5 K/uL   RBC 3.57 (L) 3.87 - 5.11 MIL/uL   Hemoglobin 10.5 (L) 12.0 - 15.0 g/dL   HCT 30.5 (L) 36.0 - 46.0 %   MCV 85.4 80.0 - 100.0 fL   MCH 29.4 26.0 - 34.0 pg   MCHC 34.4 30.0 - 36.0 g/dL   RDW 13.0 11.5 - 15.5 %   Platelets 272 150 - 400 K/uL   nRBC 0.0 0.0 - 0.2 %  Comprehensive metabolic panel     Status: Abnormal   Collection Time: 07/06/19  5:00 PM  Result Value Ref Range   Sodium 136 135 - 145 mmol/L   Potassium 3.7 3.5 - 5.1 mmol/L   Chloride 106 98 - 111 mmol/L   CO2 20 (L) 22 - 32 mmol/L   Glucose, Bld 74 70 - 99 mg/dL   BUN <5 (L) 6 - 20 mg/dL   Creatinine, Ser 0.63 0.44 - 1.00 mg/dL   Calcium 8.7 (L) 8.9 - 10.3 mg/dL   Total Protein 6.9 6.5 - 8.1 g/dL   Albumin 3.2 (L) 3.5 - 5.0 g/dL   AST 22 15 - 41 U/L   ALT 20 0 - 44 U/L   Alkaline Phosphatase 68 38 - 126 U/L   Total Bilirubin 0.4 0.3 - 1.2 mg/dL   GFR calc non Af Amer >60 >60 mL/min   GFR calc Af Amer >60 >60 mL/min   Anion gap 10 5 - 15   O/Positive/-- (05/28 1123)  Imaging:  [place] Mfm Ob Follow Up  Result Date: 07/02/2019 ----------------------------------------------------------------------  OBSTETRICS REPORT                    (Corrected Final 07/02/2019 03:08 pm) ---------------------------------------------------------------------- Patient Info  ID #:       [id number]                          D.O.B.:  [birth year] (27  yrs)  Name:       Brandi Andrews  Visit Date: 07/01/2019 09:05 am ---------------------------------------------------------------------- Performed By  Performed By:     Brandi Andrews          Referred By:      Brandi Andrews  Attending:        Johnell Comings [facility]         [facility] ---------------------------------------------------------------------- Orders   #  Description                          Code         Ordered By   1  [place] MFM OB FOLLOW UP                  16109.60     [place]  ----------------------------------------------------------------------   #  Order #                    Accession #                 Episode #   1  [id number]                  [id number]                  [id number]  ---------------------------------------------------------------------- Indications   Obesity complicating pregnancy, second         O99.212   trimester (BMI 37)   Fetal abnormality - other known or             O35.9XX0   suspected (absent left kidney)   Uterine fibroids                               O34.10   [redacted] weeks gestation of pregnancy                Z3A.26  ---------------------------------------------------------------------- Vital Signs                                                 Height:        5'7" ---------------------------------------------------------------------- Fetal Evaluation  Num Of Fetuses:         1  Fetal Heart Rate(bpm):  150  Cardiac Activity:       Observed  Presentation:           Cephalic  Placenta:               Posterior  P. Cord Insertion:      Previously Visualized  Amniotic Fluid  AFI FV:  Within normal limits ---------------------------------------------------------------------- Biometry  BPD:      68.7  mm     G. Age:  27w 4d         86  %    CI:        73.91   %    70 - 86                                                           FL/HC:      20.2   %    18.6 - 20.4  HC:      253.8  mm     G. Age:  27w 4d         74  %    HC/AC:      1.13        1.04 - 1.22  AC:      225.3  mm     G. Age:  27w 0d         66  %    FL/BPD:     74.7   %    71 - 87  FL:       51.3  mm     G. Age:  27w 3d         75  %    FL/AC:      22.8   %    20 - 24  HUM:      45.7  mm     G. Age:  27w 0d         66  %  Est. FW:    1043  gm      2 lb 5 oz     82  % ---------------------------------------------------------------------- OB History  Gravidity:    2         Term:   0        Prem:   0        SAB:   1  TOP:          0       Ectopic:  0        Living: 0 ---------------------------------------------------------------------- Gestational Age  LMP:           26w 1d        Date:  12/30/18                 EDD:   10/06/19  U/S Today:     27w 3d                                        EDD:   09/27/19  Best:          26w 1d     Det. By:  LMP  (12/30/18)          EDD:   10/06/19 ---------------------------------------------------------------------- Anatomy  Cranium:               Appears normal         Aortic Arch:            Previously seen  Cavum:  Previously seen        Ductal Arch:            Previously seen  Ventricles:            Appears normal         Diaphragm:              Previously seen  Choroid Plexus:        Previously seen        Stomach:                Appears normal, left                                                                        sided  Cerebellum:            Previously seen        Abdomen:                Previously seen  Posterior Fossa:       Previously seen        Abdominal Wall:         Previously seen  Nuchal Fold:           Previously seen        Cord Vessels:           Previously seen  Face:                  Orbits previously      Kidneys:                Abnormal, see                         seen                                                                        comments  Lips:                  Previously  seen        Bladder:                Appears normal  Thoracic:              Appears normal         Spine:                  Previously seen  Heart:                 Appears normal         Upper Extremities:      Previously seen                         (4CH, axis, and  situs)  RVOT:                  Appears normal         Lower Extremities:      Previously seen  LVOT:                  Previously seen  Other:  Female gender previously seen. Technically difficult due to maternal          habitus and fetal position. ---------------------------------------------------------------------- Cervix Uterus Adnexa  Cervix  Not visualized (advanced GA >24wks)  Uterus  No abnormality visualized.  Left Ovary  No adnexal mass visualized.  Right Ovary  No adnexal mass visualized.  Cul De Sac  No free fluid seen.  Adnexa  No abnormality visualized. ---------------------------------------------------------------------- Comments  This patient was seen for Brandi follow up growth scan due to Brandi  fetus with Brandi possible absent left kidney.  The RVOT was also  unable to be visualized during her prior ultrasound exams.  She denies any problems since her last exam.  She was informed that the fetal growth and amniotic fluid  level appears appropriate for her gestational age.  The RVOT was visualized today.  An absent left fetal kidney continues to be suspected today.  The patient was reassured that people can live Brandi normal life  with only one kidney.  The right kidney appeared within  normal limits today.  Brandi filled fetal bladder and normal amniotic  fluid were noted indicating that there is at least one normally  functioning kidney.  The patient was advised that her child will  most likely require an ultrasound after birth to confirm  whether or not the left kidney is absent or present.  Brandi follow-up growth scan was scheduled in 6 weeks to assess  the fetal growth and to assess the amniotic fluid levels due to  the absent kidney.  ----------------------------------------------------------------------                        Brandi Comings, Andrews Electronically Signed Corrected Final Report  07/02/2019 03:08 pm ----------------------------------------------------------------------   MAU Course/MDM: Orders Placed This Encounter  Procedures  . CBC  . Comprehensive metabolic panel  . Protein / creatinine ratio, urine  . Discharge patient Discharge disposition: 01-Home or Self Care; Discharge patient date: 07/06/2019    Meds ordered this encounter  Medications  . acetaminophen (TYLENOL) tablet 1,000 mg     NST reviewed: Appropriate for gestational age Patient presented to MAU with elevated BP at home and headache. BP's WNL at MAU. Labs without evidence of Pre-E.  Treatments in MAU included Tylenol. Patient reported improvement in headache prior to discharge and declined further medication.    Pt discharge with strict return precautions. F/u in clinic scheduled in 8 days.   Assessment: 1. Elevated blood pressure affecting pregnancy in second trimester, antepartum   2. Supervision of other normal pregnancy, antepartum   3. Acute nonintractable headache, unspecified headache type     Plan: Discharge home Labor precautions and fetal kick counts Follow-up [place] for [facility] Follow up.   Specialty: Obstetrics and Gynecology Contact information: [address], [place] [phone] [place] [id number] [phone]         Allergies as of 07/06/2019      Reactions   Mushroom Extract Complex Swelling, Other (See Comments)   Lips swell and patient gets dizzy   Penicillins  Other (See Comments)   Was told from childhood: Has patient had Brandi PCN reaction causing immediate rash, facial/tongue/throat swelling, SOB or lightheadedness with hypotension: Unknown Has patient had Brandi PCN reaction causing severe rash involving mucus membranes or skin necrosis:  Unknown Has patient had Brandi PCN reaction that required hospitalization: Unknown Has patient had Brandi PCN reaction occurring within the last 10 years: No If all of the above answers are "NO", then may proceed with Cephalosporin use.      Medication List    TAKE these medications   AMBULATORY NON FORMULARY MEDICATION 1 Device by Other route once Brandi week. Blood Pressure Cuff Large Monitored Regularly at home ICD 10:Z34.90   PRENATAL PO Take by mouth.       Barrington Ellison, [facility] Family Medicine Fellow, [facility] for [facility], [place] [facility] 07/06/2019 6:21 PM

## 2019-07-06 NOTE — Progress Notes (Signed)
Pt up to [place]

## 2019-07-06 NOTE — Telephone Encounter (Signed)
Received a phone call alert from [employer] that she has a bp of 141/82.  I called Brandi Andrews and she reports she rechecked her BP and it was 130/94. She also c/o headache =6 and small amount edema in her feet. Discussed with Brandi Guild  NP and advised to go to [facility] for evaluation for high blood pressure. She voices understanding. Yoana Staib,RN

## 2019-07-13 ENCOUNTER — Telehealth: Payer: Self-pay | Admitting: Obstetrics & Gynecology

## 2019-07-13 NOTE — Telephone Encounter (Signed)
Called the patient to inform of the upcoming appointment. Informed the patient of wearing a mask, no visitors, or children are allowed due to covid restrictions. The patient also answered no to all of the covid19 screening questions.

## 2019-07-14 ENCOUNTER — Other Ambulatory Visit: Payer: Commercial Managed Care - PPO

## 2019-07-14 ENCOUNTER — Ambulatory Visit (INDEPENDENT_AMBULATORY_CARE_PROVIDER_SITE_OTHER): Payer: Commercial Managed Care - PPO | Admitting: Medical

## 2019-07-14 ENCOUNTER — Encounter: Payer: Self-pay | Admitting: Medical

## 2019-07-14 ENCOUNTER — Other Ambulatory Visit: Payer: Self-pay

## 2019-07-14 VITALS — BP 135/85 | HR 92 | Temp 98.1°F | Wt 259.8 lb

## 2019-07-14 DIAGNOSIS — Z6841 Body Mass Index (BMI) 40.0 and over, adult: Secondary | ICD-10-CM

## 2019-07-14 DIAGNOSIS — Z3483 Encounter for supervision of other normal pregnancy, third trimester: Secondary | ICD-10-CM

## 2019-07-14 DIAGNOSIS — Z3A28 28 weeks gestation of pregnancy: Secondary | ICD-10-CM

## 2019-07-14 DIAGNOSIS — Z3480 Encounter for supervision of other normal pregnancy, unspecified trimester: Secondary | ICD-10-CM

## 2019-07-14 DIAGNOSIS — N879 Dysplasia of cervix uteri, unspecified: Secondary | ICD-10-CM

## 2019-07-14 DIAGNOSIS — Z23 Encounter for immunization: Secondary | ICD-10-CM | POA: Diagnosis not present

## 2019-07-14 DIAGNOSIS — D573 Sickle-cell trait: Secondary | ICD-10-CM

## 2019-07-14 NOTE — Progress Notes (Signed)
   PRENATAL VISIT NOTE  Subjective:  Brandi Andrews is a 28 y.o. G2P0010 at [redacted]w[redacted]d being seen today for ongoing prenatal care.  She is currently monitored for the following issues for this low-risk pregnancy and has BMI 40.0-44.9, adult ([place]); Cervical dysplasia; Noncompliance; Supervision of other normal pregnancy, antepartum; Subchorionic hematoma in second trimester; Sickle cell trait ([place]); and UTI in pregnancy, antepartum on their problem list.  Patient reports no complaints.  Contractions: Not present. Vag. Bleeding: None.  Movement: Present. Denies leaking of fluid.   The following portions of the patient's history were reviewed and updated as appropriate: allergies, current medications, past family history, past medical history, past social history, past surgical history and problem list.   Objective:   Vitals:   07/14/19 0854  BP: 135/85  Pulse: 92  Temp: 98.1 F (36.7 C)  Weight: 259 lb 12.8 oz (117.8 kg)    Fetal Status: Fetal Heart Rate (bpm): 147 Fundal Height: 29 cm Movement: Present     General:  Alert, oriented and cooperative. Patient is in no acute distress.  Skin: Skin is warm and dry. No rash noted.   Cardiovascular: Normal heart rate noted  Respiratory: Normal respiratory effort, no problems with respiration noted  Abdomen: Soft, gravid, appropriate for gestational age.  Pain/Pressure: Absent     Pelvic: Cervical exam deferred        Extremities: Normal range of motion.  Edema: None  Mental Status: Normal mood and affect. Normal behavior. Normal judgment and thought content.   Assessment and Plan:  Pregnancy: G2P0010 at [redacted]w[redacted]d 1. Supervision of other normal pregnancy, antepartum - CBC, HIV, RPR and 2 hour GTT today  - Tdap vaccine greater than or equal to 7yo IM - Patient voiced concerns about multiple providers and not knowing who will deliver her baby, discussed the nature of the practice and our attempt to limit the number of prenatal providers whenever  possible. Patient will have a Doula with her for delivery as well.   2. Sickle cell trait ([place]) - Advised partner testing is recommended   3. Cervical dysplasia - Needs PP colposcopy   4. BMI 40.0-44.9, adult ([place])   Preterm labor symptoms and general obstetric precautions including but not limited to vaginal bleeding, contractions, leaking of fluid and fetal movement were reviewed in detail with the patient. Please refer to After Visit Summary for other counseling recommendations.   Return in about 2 weeks (around 07/28/2019) for LOB, Virtual.  Future Appointments  Date Time Provider [place]  07/29/2019 10:15 AM Rasch, Artist Pais, NP WOC-WOCA WOC  08/12/2019 10:15 AM Rasch, Artist Pais, NP WOC-WOCA WOC  08/12/2019 11:10 AM WH-MFC NURSE WH-MFC MFC-US  08/12/2019 11:15 AM WH-MFC [place] 4 WH-MFCUS MFC-US  08/26/2019  9:35 AM Rasch, Artist Pais, NP WOC-WOCA WOC  09/09/2019  9:35 AM Rasch, Artist Pais, NP WOC-WOCA WOC  09/16/2019  9:35 AM Rasch, Artist Pais, NP WOC-WOCA WOC  09/22/2019  9:15 AM Tre[place] Mall, CNM WOC-WOCA WOC    Kerry Hough, PA-C

## 2019-07-14 NOTE — Progress Notes (Signed)
Pt states will get Flu shot from Work, asks if she could give date so when can note on chart.

## 2019-07-14 NOTE — Patient Instructions (Addendum)
Fetal Movement Counts Patient Name: ________________________________________________ Patient Due Date: ____________________ What is a fetal movement count?  A fetal movement count is the number of times that you feel your baby move during a certain amount of time. This may also be called a fetal kick count. A fetal movement count is recommended for every pregnant woman. You may be asked to start counting fetal movements as early as week 28 of your pregnancy. Pay attention to when your baby is most active. You may notice your baby's sleep and wake cycles. You may also notice things that make your baby move more. You should do a fetal movement count:  When your baby is normally most active.  At the same time each day. A good time to count movements is while you are resting, after having something to eat and drink. How do I count fetal movements? 1. Find a quiet, comfortable area. Sit, or lie down on your side. 2. Write down the date, the start time and stop time, and the number of movements that you felt between those two times. Take this information with you to your health care visits. 3. For 2 hours, count kicks, flutters, swishes, rolls, and jabs. You should feel at least 10 movements during 2 hours. 4. You may stop counting after you have felt 10 movements. 5. If you do not feel 10 movements in 2 hours, have something to eat and drink. Then, keep resting and counting for 1 hour. If you feel at least 4 movements during that hour, you may stop counting. Contact a health care provider if:  You feel fewer than 4 movements in 2 hours.  Your baby is not moving like he or she usually does. Date: ____________ Start time: ____________ Stop time: ____________ Movements: ____________ Date: ____________ Start time: ____________ Stop time: ____________ Movements: ____________ Date: ____________ Start time: ____________ Stop time: ____________ Movements: ____________ Date: ____________ Start time:  ____________ Stop time: ____________ Movements: ____________ Date: ____________ Start time: ____________ Stop time: ____________ Movements: ____________ Date: ____________ Start time: ____________ Stop time: ____________ Movements: ____________ Date: ____________ Start time: ____________ Stop time: ____________ Movements: ____________ Date: ____________ Start time: ____________ Stop time: ____________ Movements: ____________ Date: ____________ Start time: ____________ Stop time: ____________ Movements: ____________ This information is not intended to replace advice given to you by your health care provider. Make sure you discuss any questions you have with your health care provider. Document Released: 11/13/2006 Document Revised: 11/03/2018 Document Reviewed: 11/23/2015 Elsevier Patient Education  2020 Elsevier Inc. [employer] of the uterus can occur throughout pregnancy, but they are not always a sign that you are in labor. You may have practice contractions called Braxton Hicks contractions. These false labor contractions are sometimes confused with true labor. What are [place] contractions? Braxton Hicks contractions are tightening movements that occur in the muscles of the uterus before labor. Unlike true labor contractions, these contractions do not result in opening (dilation) and thinning of the cervix. Toward the end of pregnancy (32-34 weeks), Braxton Hicks contractions can happen more often and may become stronger. These contractions are sometimes difficult to tell apart from true labor because they can be very uncomfortable. You should not feel embarrassed if you go to the hospital with false labor. Sometimes, the only way to tell if you are in true labor is for your health care provider to look for changes in the cervix. The health care provider will do a physical exam and may monitor your contractions. If you  are not in true labor, the exam should show  that your cervix is not dilating and your water has not broken. °If there are no other health problems associated with your pregnancy, it is completely safe for you to be sent home with false labor. You may continue to have Braxton Hicks contractions until you go into true labor. °How to tell the difference between true labor and false labor °True labor °· Contractions last 30-70 seconds. °· Contractions become very regular. °· Discomfort is usually felt in the top of the uterus, and it spreads to the lower abdomen and low back. °· Contractions do not go away with walking. °· Contractions usually become more intense and increase in frequency. °· The cervix dilates and gets thinner. °False labor °· Contractions are usually shorter and not as strong as true labor contractions. °· Contractions are usually irregular. °· Contractions are often felt in the front of the lower abdomen and in the groin. °· Contractions may go away when you walk around or change positions while lying down. °· Contractions get weaker and are shorter-lasting as time goes on. °· The cervix usually does not dilate or become thin. °Follow these instructions at home: ° °· Take over-the-counter and prescription medicines only as told by your health care provider. °· Keep up with your usual exercises and follow other instructions from your health care provider. °· Eat and drink lightly if you think you are going into labor. °· If Braxton Hicks contractions are making you uncomfortable: °? Change your position from lying down or resting to walking, or change from walking to resting. °? Sit and rest in a tub of warm water. °? Drink enough fluid to keep your urine pale yellow. Dehydration may cause these contractions. °? Do slow and deep breathing several times an hour. °· Keep all follow-up prenatal visits as told by your health care provider. This is important. °Contact a health care provider if: °· You have a fever. °· You have continuous pain in  your abdomen. °Get help right away if: °· Your contractions become stronger, more regular, and closer together. °· You have fluid leaking or gushing from your vagina. °· You pass blood-tinged mucus (bloody show). °· You have bleeding from your vagina. °· You have low back pain that you never had before. °· You feel your baby’s head pushing down and causing pelvic pressure. °· Your baby is not moving inside you as much as it used to. °Summary °· Contractions that occur before labor are called Braxton Hicks contractions, false labor, or practice contractions. °· Braxton Hicks contractions are usually shorter, weaker, farther apart, and less regular than true labor contractions. True labor contractions usually become progressively stronger and regular, and they become more frequent. °· Manage discomfort from Braxton Hicks contractions by changing position, resting in a warm bath, drinking plenty of water, or practicing deep breathing. °This information is not intended to replace advice given to you by your health care provider. Make sure you discuss any questions you have with your health care provider. °Document Released: 02/27/2017 Document Revised: 09/26/2017 Document Reviewed: 02/27/2017 °Elsevier Patient Education © 2020 Elsevier Inc. ° °AREA PEDIATRIC/FAMILY PRACTICE PHYSICIANS ° °[place] ([address]) °• [facility] °o Chambliss, MD; Eniola, MD; Hale, MD; Hensel, MD; McDiarmid, MD; McIntyer, MD; Neal, MD; Walden, MD °o [address] [place], [place] [address] °o [phone] °o Mon-Fri 8:30-12:30, 1:30-5:00 °o Providers come to see babies at [facility] °o Accepting Medicaid °• [facility] °o Limited providers who accept newborns: Koirala, MD; Morrow, MD; Wolters, MD °o [address], [place], [place] [address] °o [phone] °o Mon-Fri 8:00-5:30 °o Babies seen by providers at [facility] °o Does NOT accept  Medicaid °o Please call early in hospitalization for appointment (limited availability)  °• [facility] °o Mulberry, MD °o [address] [place], [place] [address] °o [phone] °o Mon, Tue, Thur, Fri 8:30-5:00, Wed 10:00-7:00 (closed 1-2pm) °o Babies seen by [facility] providers °o Accepting Medicaid °• [facility] °o Rubin, MD °o [address], [place], [place] [address] °o [phone] °o Mon-Fri 8:30-5:00, Sat 8:30-12:00 °o Provider comes to see babies at [facility] °o Accepting Medicaid °o Must have been referred from current patients or contacted office prior to delivery °• [facility] ([facility]) °o Brown, MD; Chandler, MD; Ettefagh, MD; Grant, MD; Lester, MD; McCormick, MD; McQueen, MD; Prose, MD; Simha, MD; Stanley, MD; Stryffeler, NP; Tebben, NP °o [address], [place], [place] [address] °o [phone] °o Mon, Tue, Thur, Fri 8:30-5:30, Wed 9:30-5:30, Sat 8:30-12:30 °o Babies seen by [facility] providers °o Accepting Medicaid °o Only accepting infants of first-time parents or siblings of current patients °o Hospital discharge coordinator will make follow-up appointment °• Jack Amos °o [address], [place], [place]  [address] °o [phone]   Fax - [phone] °• [facility] °o [address], [place], [place]  [address] °o Phone - [phone]   Fax - [phone] °• Shilpa Gosrani °o [address], [place], [place]  [address] °o [phone] ° °[place] ([address]) °• [facility] °o Bates, MD; Brassfield, MD; Cooper, Cox, MD; MD; Davis, MD; Dovico, MD; Ettefaugh, MD; Little, MD; Lowe, MD; Keiffer, MD; Melvin, MD; Sumner, MD; Williams, MD °o [address], [place], [place] [address] °o [phone] °o Mon-Fri 8:30-5:00 (extended evenings Mon-Thur as needed), Sat-Sun 10:00-1:00 °o Providers come to see babies at [facility] °o Accepting Medicaid for families of first-time babies and families with all children in the household age 3 and under. Must register with office prior to making appointment (M-F only). °• [facility] °o Henson, NP; Knapp, MD; Lalonde, MD; Tysinger, PA °o [address] [place], [place] [address] °o [phone] °o Mon-Fri 8:00-5:00 °o Babies seen by providers at [facility] °o Does NOT accept Medicaid/Commercial Insurance Only °• [facility] ([facility]  °o Artis, MD; Barnes, MD; Bratton, MD; Coccaro, MD; [place] Gardner, MD; Kramer, MD; Marshall, MD; Netherton, MD; Poleto, MD; Skinner, MD °o [address] [place], [place] [address] °o [phone] °o Mon-Fri 8:30-5:30, Sat (Oct.-Mar.) 9:00-1:00 °o Babies seen by providers at [facility] °o Accepting Medicaid ° °[place] ([address]) °• [facility] °o Reid, MD; Warner, MD °o [address], [place], [place] [address] °o [phone] °o Mon-Fri 8:30-5:00, Sat 8:30-12:00 °o Providers come to see babies at [facility] °o Does NOT accept Medicaid °• [facility] °o Becker, PA; Hagler, MD; Scifres, PA; Sun, MD; Swayne, MD °o [address], , [place] [address] °o [phone] °o Mon-Fri 8:00-5:00 °o Babies seen by providers at [facility] °o Does NOT accept Medicaid °o Only accepting babies of parents who are patients °o Please call early in hospitalization for appointment (limited availability) °•    Pediatricians °o Clark, MD; Frye, MD; Kelleher, MD; Mack, NP; Miller, MD; O'Keller, MD; Patterson, NP; Pudlo, MD; Puzio, MD; Thomas, MD; Tucker, MD; Twiselton, MD °o [address], [place], [place] [address] °o [phone] °o Mon-Fri 8:00-5:00, Sat 9:00-12:00 °o Providers come to see babies at [facility] °o Does NOT accept Medicaid ° °[place] ([address]) °• [facility] °o Limited providers accepting new patients: Brake, NP; Wharton, PA °o [address], [place], [place] [address] °o [phone] °o Mon-Fri 8:00-5:00 °o Babies seen by providers at [facility] °o Does NOT accept Medicaid °o Only accepting babies of parents who are patients °o Please call early in hospitalization for appointment (limited availability) °• [facility] °o Gay, MD; Quinlan, MD °o [address] [place], [place] [address] °o [phone] (press 1 to schedule appointment) °o Mon-Fri 8:00-5:00 °o Providers come to see babies at [facility] °o Does NOT accept Medicaid °• [facility] °o Mazer, MD °o [address] [place], [place] [address] °o [phone] °o Mon-Fri 8:30-5:00 (lunch 12:30-1:00), extended hours by appointment only Wed 5:00-6:30 °o Babies seen by [facility] providers °o Accepting Medicaid °• [facility] °o Banks, MD; Jordan, MD; Koberlein, MD °o [address], [place], [place] [address] °o [phone] °o Mon-Fri 8:00-5:00 °o Babies seen by [facility] providers °o Does NOT accept Medicaid °• [facility] °o Parker, MD; Hunter, MD; Wallace, DO °o [address] [place], [place] [address] °o [phone] °o Mon-Fri 8:00-5:00 °o Babies seen by [facility] providers °o Does NOT accept Medicaid °• [facility] °o Brandon, PA; Brecken, PA; Christy, NP; Dees, MD; DeClaire, MD; DeWeese, MD; Hansen, NP; Mills, NP; Parrish, NP; Smoot, NP; Summer, MD; Vapne, MD °o [address] [place], [place] [address] °o [phone] °o Mon-Fri 8:30-5:00, Sat 10:00-1:00 °o Providers come to see babies at [facility] °o Does NOT accept Medicaid °o Free prenatal information session Tuesdays at 4:45pm °• [facility] °o Bouska, MD; Gordon, PA; Jeffery, PA; Weber, PA °o [address] [place] [address] °o [phone] °o Mon-Fri  7:30-5:30 °o Babies seen by [facility] providers °• [facility] °o [address], [place], [place]  [address] °o [phone]   Fax - [phone] ° °[place] ([address]) °• [facility] °o Reese, MD °o [address] [place], [place] [address] °o [phone] °o Mon-Thur 8:00-6:00 °o Providers come to see babies at [facility] °o Accepting Medicaid °• [facility] °o Anderson, NP; Badger, MD; Beal, PA; Spencer, PA °o [address] [place], [place] [address] °o [phone] °o Mon-Thur 7:30-7:30, Fri 7:30-4:30 °o Babies seen by [facility] providers °o Accepting Medicaid °• [facility] °o Agbuya, MD; Klett, NP; Romgoolam, MD °o [address], [place],  [address] °o [phone] °o Mon-Fri 8:30-5:00, Sat 8:30-12:00 °o Providers come to see babies at [facility] °o Accepting Medicaid °o Must have “Meet & Greet” appointment at office prior to delivery °• [facility] ([facility]) °o McCord, MD; Wallace, MD; Wood, MD °o [address], [place],  [address] °o [phone] °o Mon-Wed 8:00-6:00, Thur-Fri 8:00-5:00, Sat 9:00-12:00 °o Providers come to see babies at [facility] °o Does NOT accept Medicaid °o Only accepting siblings of current patients °• [facility]  °o [address], [place],   [place]  [address] °o [phone]   Fax - [phone] °• [facility] °o [address], [place], [place]  [address] °o [phone]   Fax - [phone] ° °[place] ([address]) °• [facility] °o Cirigliano, DO; Matthews, DO °o [address] , [place] [address] °o [phone] °o Mon-Fri 7:00-5:00 °o Babies seen by [facility] providers °o Does NOT accept Medicaid °• [facility] °o Briscoe, MD; Howley, PA; Moreira, PA °o [address], [place], [place] [address] °o [phone] °o Mon-Fri 8:00-5:00 °o Babies seen by [facility] providers °o Accepting Medicaid °• [facility] °o Boyd, MD; Church, PA; Jones, NP; Osborn, PA °o [address], , [place] [address] °o [phone] °o Mon-Fri 8:00-5:00 °o Babies seen by providers at [facility] °o Accepting Medicaid ° °[place] ([address]) °• [facility] [place] °o Wendling, DO °o [address] [place], [place] [address] °o [phone] °o Mon-Fri 8:00-5:00 °o Babies seen by [facility] providers °o Does NOT accept Medicaid °o Limited availability, please call early in hospitalization to schedule follow-up °• [facility] °o Calderon, PA; Cummings, MD; Dillard, MD; Martin, PA; Olson, MD; VanDeven, PA °o [address], [place], [place] [address] °o [phone] °o Mon-Fri 8:30-5:00, Sat 9:00-12:00 °o Babies seen by providers at [facility] °o Accepting Medicaid °o Please register online then schedule online or call office °o [email or web address] °• [facility] ([facility] °o Hunter, NP; Kumar, MD; Martin Rogers, PA °o [address], [place], [place] [address] °o [phone] °o Mon-Fri 8:00-5:00 °o Babies seen by providers at [facility] °o Accepting Medicaid °• [facility] ([facility]) °o [place], MD; Kristi Fleenor, NP; West, MD °o [address], [place], [place] [address] °o [phone] °o Mon-Fri 8:00-5:30, Sat&Sun by appointment (phones open at 8:30) °o Babies seen by [facility] providers °o Accepting Medicaid °o Must be a first-time baby or sibling of current patient °• [facility] [place]  °o [address], [place], [place]   [address] °o [phone]   Fax - [phone] ° °[place] ([address]) °• [facility] [place] [facility] °o Brown, PA; Cowen, PA; Rice, MD; Helton, PA; Spry, MD °o [address] [place], [place] [address] °o [phone] °o Mon-Thur 8:00-7:00, Fri 8:00-5:00, Sat 8:00-12:00, Sun 9:00-12:00 °o Babies seen by [facility] providers °o Accepting Medicaid °• [facility] °o Coe-Goins, MD; Marshall, MD; Pierre-Louis, MD °o [address], [place],  [address] °o [phone] °o Mon-Thur 8:00-5:00 °o Babies seen by providers at [facility] °o Accepting Medicaid °• [facility] °o Bratton, MD; Coe-Goins, MD; Hayes, MD; Lewis, MD; List, MD; Lott, MD; Marshall, MD; Moran, MD; O'Neal, MD; Pierre-Louis, MD; Pitonzo, MD; Scholer, MD; Spangle, MD °o [address] [place],  [address] °o [phone] °o Mon-Fri 8:00-5:30, Sat (Oct.-Mar.) 9:00-1:00 °o Babies seen by providers at [facility] °o Accepting Medicaid °o Must fill out new patient packet, available online at [email or web address] °• [facility] ([facility]) °o Friddle, NP; Barret, NP; Kelly, NP; Logan, MD; Melvin, PA; Poth, MD; Ramadoss, MD; Stanton,   NP °o [address], [place], [place] [address] °o [phone] °o Mon-Thur 8:00-5:30, Fri 8:00-5:00 °o Babies seen by providers at [facility] °o Accepting Medicaid ° °[place] ([address]) °• [facility] °o Dixon, PA; [place], MD; Pickard, MD; Tapia, PA °o [address], [place], [place] [address] °o [phone] °o Mon-Fri 8:00-5:00 °o Babies seen by providers at [facility] °o Accepting Medicaid  ° °[place] ([address]) °• [facility] [place] °o Masneri, DO; Meyers, MD; Nelson, PA °o [address], [place], [place] [address] °o [phone] °o Mon-Fri 8:00-5:00 °o Babies  seen by providers at [facility] °o Does NOT accept Medicaid °o Limited appointment availability, please call early in hospitalization ° °• [facility] [place] °o Kunedd, DO; McGowen, MD °o [address], [place], [place] [address] °o [phone] °o Mon-Fri 8:00-5:00 °o Babies seen by [facility] providers °o Does NOT accept Medicaid °• [facility] [place] °o Cameron, MD; MacDonald, MD; Michaels, PA; Nayak, MD °o [address] [place] [address], [place], [place] [address] °o [phone] °o Mon-Fri 8:00-5:00 °o After hours clinic ([address] [place], [place] [address]) [phone] Mon-Fri 5:00-8:00, Sat 12:00-6:00, Sun 10:00-4:00 °o Babies seen by [facility] providers °o Accepting Medicaid °• [facility] [place] °o [address], [place], [place]  [address] °o [phone]   Fax - [phone] ° °[place] ([address]) °• [facility] [place] °o Andy, MD °o [address], [place], [place] [address] °o [phone] °o Mon-Fri 8:00-5:00 °o Babies seen by [facility] providers °o Does NOT accept Medicaid °• [facility] [place] ([facility] [place]) °o Eksir, MD °o [address], [place], [place] [address] °o [phone] °o Mon-Thur 8:00-7:00, Fri 8:00-5:00, Sat 8:00-12:00 °o Babies seen by providers at [facility] °o Accepting Medicaid - but does not have vaccinations in office (must be received elsewhere) °o Limited availability, please call early in hospitalization ° °[place] ([address]) °• [facility]  °o Charlene Flemming, MD °o [address],  [place] [address] °o [phone]  Fax [phone] ° ° °

## 2019-07-15 ENCOUNTER — Telehealth: Payer: Self-pay | Admitting: Lactation Services

## 2019-07-15 LAB — GLUCOSE TOLERANCE, 2 HOURS W/ 1HR
Glucose, 1 hour: 185 mg/dL — ABNORMAL HIGH (ref 65–179)
Glucose, 2 hour: 125 mg/dL (ref 65–152)
Glucose, Fasting: 86 mg/dL (ref 65–91)

## 2019-07-15 LAB — CBC
Hematocrit: 30 % — ABNORMAL LOW (ref 34.0–46.6)
Hemoglobin: 10.3 g/dL — ABNORMAL LOW (ref 11.1–15.9)
MCH: 29.3 pg (ref 26.6–33.0)
MCHC: 34.3 g/dL (ref 31.5–35.7)
MCV: 86 fL (ref 79–97)
Platelets: 270 10*3/uL (ref 150–450)
RBC: 3.51 x10E6/uL — ABNORMAL LOW (ref 3.77–5.28)
RDW: 13.1 % (ref 11.7–15.4)
WBC: 11.7 10*3/uL — ABNORMAL HIGH (ref 3.4–10.8)

## 2019-07-15 LAB — HIV ANTIBODY (ROUTINE TESTING W REFLEX): HIV Screen 4th Generation wRfx: NONREACTIVE

## 2019-07-15 LAB — RPR: RPR Ser Ql: NONREACTIVE

## 2019-07-15 NOTE — Telephone Encounter (Signed)
Pt called wanting results of Glucose tolerance testing. Pt was answered via MyChart previously.

## 2019-07-16 ENCOUNTER — Other Ambulatory Visit: Payer: Self-pay | Admitting: Lactation Services

## 2019-07-16 DIAGNOSIS — O24912 Unspecified diabetes mellitus in pregnancy, second trimester: Secondary | ICD-10-CM

## 2019-07-16 MED ORDER — ONETOUCH VERIO W/DEVICE KIT: 1.0000 | PACK | Freq: Once | 0 refills | Status: AC

## 2019-07-16 MED ORDER — GLUCOSE BLOOD VI STRP: ORAL_STRIP | 12 refills | Status: DC

## 2019-07-16 MED ORDER — ONETOUCH ULTRASOFT LANCETS MISC: 12 refills | Status: DC

## 2019-07-16 NOTE — Progress Notes (Signed)
Glucometer and supplies sent to pharmacy along. Ambulatory referral to Diabetes and Nutrition ordered Pt notified by MyChart.

## 2019-07-19 ENCOUNTER — Other Ambulatory Visit: Payer: Self-pay | Admitting: Advanced Practice Midwife

## 2019-07-19 DIAGNOSIS — O24419 Gestational diabetes mellitus in pregnancy, unspecified control: Secondary | ICD-10-CM | POA: Insufficient documentation

## 2019-07-19 DIAGNOSIS — Z3480 Encounter for supervision of other normal pregnancy, unspecified trimester: Secondary | ICD-10-CM

## 2019-07-26 ENCOUNTER — Telehealth: Payer: Self-pay | Admitting: Family Medicine

## 2019-07-26 NOTE — Telephone Encounter (Signed)
Spoke to patient about her appointment on 9/29 @ 8:15. Patient instructed to wear a face mask for the entire appointment and no visitors are allowed with her during the visit. Patient screened for covid symptoms and denied having any

## 2019-07-27 ENCOUNTER — Other Ambulatory Visit: Payer: Self-pay

## 2019-07-27 ENCOUNTER — Encounter: Payer: Commercial Managed Care - PPO | Attending: Obstetrics & Gynecology | Admitting: *Deleted

## 2019-07-27 ENCOUNTER — Ambulatory Visit: Payer: Commercial Managed Care - PPO | Admitting: *Deleted

## 2019-07-27 DIAGNOSIS — Z713 Dietary counseling and surveillance: Secondary | ICD-10-CM | POA: Insufficient documentation

## 2019-07-27 DIAGNOSIS — O24912 Unspecified diabetes mellitus in pregnancy, second trimester: Secondary | ICD-10-CM | POA: Insufficient documentation

## 2019-07-27 NOTE — Progress Notes (Signed)
  Patient was seen on 07/27/2019 for Gestational Diabetes self-management. EDD 10/06/2019. Patient states no history of GDM. Diet history obtained. Patient eats good variety of all food groups. Beverages include water, OJ and occasionally Gatorade .  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 The following learning objectives were met by the patient :   States the definition of Gestational Diabetes  States why dietary management is important in controlling blood glucose  Describes the effects of carbohydrates on blood glucose levels  Demonstrates ability to create a balanced meal plan  Demonstrates carbohydrate counting   States when to check blood glucose levels  Demonstrates proper blood glucose monitoring techniques  States the effect of stress and exercise on blood glucose levels  States the importance of limiting caffeine and abstaining from alcohol and smoking  Plan:  Aim for 3 Carb Choices per meal (45 grams) +/- 1 either way  Aim for 1-2 Carbs per snack Begin reading food labels for Total Carbohydrate of foods If OK with your MD, consider  increasing your activity level by walking, Arm Chair Exercises or other activity daily as tolerated Begin checking BG before breakfast and 2 hours after first bite of breakfast, lunch and dinner as directed by MD  Bring Log Book/Sheet and meter to every medical appointment OR use Baby Scripts (see below) Baby Scripts:  Patient was introduced to [employer] and is using as record of BG electronically  Take medication if directed by MD  Patient already has a meter: Accu Chek Guide And is testing pre breakfast and 2 hours each meal as directed by MD She reports FBG have been in low 100's but today was 95. She reports post meal blood sugars typically 120-140 with post dinner numbers in the 150's . She has an NP virtual visit in 2 days so I will report to that provider to note blood sugars to  determine medication options.    Patient instructed to monitor glucose levels: FBS: 60 - 95 mg/dl 2 hour: <120 mg/dl  Patient received the following handouts:  Nutrition Diabetes and Pregnancy  Carbohydrate Counting List  Patient will be seen for follow-up as needed.

## 2019-07-28 ENCOUNTER — Telehealth: Payer: Self-pay | Admitting: Obstetrics and Gynecology

## 2019-07-28 NOTE — Telephone Encounter (Signed)
Spoke with patient about her appointment on 10/1 @ 10:15. Patient instructed that the appointment is a mychart visit. Patient instructed to download the mychart app if not already done so. Patient verbalized she has the app downloaded. Patient instructed that a nurse will be calling her around her appointment time.

## 2019-07-29 ENCOUNTER — Telehealth (INDEPENDENT_AMBULATORY_CARE_PROVIDER_SITE_OTHER): Payer: Commercial Managed Care - PPO | Admitting: Obstetrics and Gynecology

## 2019-07-29 ENCOUNTER — Other Ambulatory Visit: Payer: Self-pay

## 2019-07-29 VITALS — BP 129/72 | HR 90

## 2019-07-29 DIAGNOSIS — O24419 Gestational diabetes mellitus in pregnancy, unspecified control: Secondary | ICD-10-CM

## 2019-07-29 DIAGNOSIS — Z3A30 30 weeks gestation of pregnancy: Secondary | ICD-10-CM

## 2019-07-29 DIAGNOSIS — Z9119 Patient's noncompliance with other medical treatment and regimen: Secondary | ICD-10-CM

## 2019-07-29 DIAGNOSIS — Z91199 Patient's noncompliance with other medical treatment and regimen due to unspecified reason: Secondary | ICD-10-CM

## 2019-07-29 NOTE — Progress Notes (Signed)
I connected with  Brandi Andrews on 07/29/19 at 10:15 AM EDT by telephone and verified that I am speaking with the correct person using two identifiers.   I discussed the limitations, risks, security and privacy concerns of performing an evaluation and management service by telephone and the availability of in person appointments. I also discussed with the patient that there may be a patient responsible charge related to this service. The patient expressed understanding and agreed to proceed.  [place], [place] 07/29/2019  10:28 AM

## 2019-07-29 NOTE — Progress Notes (Signed)
TELEHEALTH VIRTUAL OBSTETRICS VISIT ENCOUNTER NOTE  I connected with Brandi Andrews on 07/30/19 at 10:15 AM EDT by telephone at home and verified that I am speaking with the correct person using two identifiers.   I discussed the limitations, risks, security and privacy concerns of performing an evaluation and management service by telephone and the availability of in person appointments. I also discussed with the patient that there may be a patient responsible charge related to this service. The patient expressed understanding and agreed to proceed.  Subjective:  Brandi Andrews is a 28 y.o. G2P0010 at [redacted]w[redacted]d being followed for ongoing prenatal care.  She is currently monitored for the following issues for this high-risk pregnancy and has BMI 40.0-44.9, adult ([place]); Cervical dysplasia; Noncompliance; Supervision of other normal pregnancy, antepartum; Subchorionic hematoma in second trimester; Sickle cell trait ([place]); UTI in pregnancy, antepartum; and Gestational diabetes on their problem list.  Patient reports no complaints. Reports fetal movement. Denies any contractions, bleeding or leaking of fluid.   The following portions of the patient's history were reviewed and updated as appropriate: allergies, current medications, past family history, past medical history, past social history, past surgical history and problem list.   Objective:   General:  Alert, oriented and cooperative.   Mental Status: Normal mood and affect perceived. Normal judgment and thought content.  Rest of physical exam deferred due to type of encounter  Assessment and Plan:  Pregnancy: G2P0010 at [redacted]w[redacted]d  1. Gestational diabetes mellitus (GDM), antepartum, gestational diabetes method of control unspecified  BP 129/72 Diet controlled Saw Diabetes educator  Fasting BS: She has 6 logged, 5/6 > 95. The highest fasting reading was 112 2 hour post prandial. She has logged 4, 4/4 were less than 120. The highest  reading was 106.    2. Noncompliance  MD schedule in 2 weeks for ? Medication initiation. If BS WNL, and patient is compliant. Ok to resume APP scheduled for A1 DM   Lengthy discussion about importance of testing blood sugars, bringing log book and keeping appointments. Discussed risks of uncontrolled DM in pregnancy including delayed fetal lung maturity, IUFD and shoulder dystocia possibly resulting brachial plexus palsy, brain damage, intrapartum death and extensive obstetric lacerations. Patient verbalizes understanding.   There are no diagnoses linked to this encounter. Preterm labor symptoms and general obstetric precautions including but not limited to vaginal bleeding, contractions, leaking of fluid and fetal movement were reviewed in detail with the patient.  I discussed the assessment and treatment plan with the patient. The patient was provided an opportunity to ask questions and all were answered. The patient agreed with the plan and demonstrated an understanding of the instructions. The patient was advised to call back or seek an in-person office evaluation/go to MAU at [facility] for any urgent or concerning symptoms. Please refer to After Visit Summary for other counseling recommendations.   I provided 12 minutes of non-face-to-face time during this encounter.  Return Please schedule on 10/15 with MD for Diabetes management..  Future Appointments  Date Time Provider [place]  08/12/2019 10:15 AM Brandi Andrews, Brandi Pais, Brandi Andrews [facility]  08/12/2019 11:10 AM Brandi Andrews [facility]  08/12/2019 11:15 AM Brandi Andrews [facility]  08/26/2019  9:35 AM Brandi Andrews, Brandi Pais, Brandi Andrews [facility] WOC  09/01/2019  9:30 AM Brandi Ruddy, MD [facility]  09/09/2019  9:35 AM Brandi Andrews, Brandi Pais, Brandi Andrews [facility] WOC  09/16/2019  9:35 AM Brandi Andrews, Brandi Pais, Brandi Andrews [facility] WOC  09/22/2019  9:15 AM Brandi Andrews, Brandi Andrews [facility] [place], Brandi Andrews [facility] [employer], [place]

## 2019-07-29 NOTE — Patient Instructions (Signed)
Gestational Diabetes Mellitus, Diagnosis Gestational diabetes (gestational diabetes mellitus) is a temporary form of diabetes that some women develop during pregnancy. It usually occurs around weeks 24-28 of pregnancy, and it goes away after delivery. Hormonal changes during pregnancy can interfere with insulin production and function, which may result in one or both of these problems:  The pancreas does not make enough of a hormone called insulin.  Cells in the body do not respond properly to insulin that the body makes (insulin resistance). Normally, insulin allows blood sugar (glucose) to enter cells in the body. The cells use glucose for energy. Insulin resistance or lack of insulin causes excess glucose to build up in the blood instead of going into cells. As a result, high blood glucose (hyperglycemia) develops. If gestational diabetes is treated, it is not likely to cause problems. If it is not controlled with treatment, it may cause problems during labor and delivery, and some of those problems can be harmful to the unborn baby (fetus) and the mother. Women who get gestational diabetes are more likely to develop it if they get pregnant again, and they are more likely to develop type 2 diabetes in the future. What increases the risk? This condition may be more likely to develop in pregnant women who:  Are older than age 71 during pregnancy.  Have a family history of diabetes.  Are overweight.  Had gestational diabetes in the past.  Have polycystic ovary syndrome (PCOS).  Are pregnant with twins or multiples.  Are of American-Indian, African-American, Hispanic/Latino, or Asian/Pacific Islander descent. What are the signs or symptoms? Most women do not notice symptoms of gestational diabetes because the symptoms are similar to normal symptoms of pregnancy. Symptoms of gestational diabetes may include:  Increased thirst (polydipsia).  Increased hunger(polyphagia).  Increased  urination (polyuria). How is this diagnosed? This condition may be diagnosed based on your blood glucose level, which may be checked with one or more of the following blood tests:  A fasting blood glucose (FBG) test. You will not be allowed to eat (you will fast) for 8 hours or longer before a blood sample is taken.  A random blood glucose test. This checks your blood glucose at any time of day regardless of when you ate.  An oral glucose tolerance test (OGTT). This is usually done during weeks 24-28 of pregnancy. ? For this test, you will have an FBG test done. Then, you will drink a beverage that contains glucose. Your blood glucose will be tested again one hour after you drink the glucose beverage (1-hour OGTT). ? If the 1-hour OGTT result is at or above 140 mg/dL (7.8 mmol/L), you will repeat the OGTT. This time, your blood glucose will be tested 3 hours after you drink the glucose beverage (3-hour OGTT). If you have risk factors, you may be screened for undiagnosed type 2 diabetes at your first health care visit during your pregnancy (prenatal visit). How is this treated?     Your treatment may be managed by a specialist called an endocrinologist. This condition is treated by following instructions from your health care provider about:  Eating a healthy diet and getting more physical activity. These changes are the most important ways to manage gestational diabetes.  Checking your blood glucose. Do this as often as told.  Taking diabetes medicines or insulin every day. These will only be prescribed if they are needed. ? If you use insulin, you may need to adjust your dosage based on how physically active  you are and what foods you eat. Your health care provider will tell you how to do this. Your health care provider will set treatment goals for you based on the stage of your pregnancy and any other medical conditions you have. Generally, the goal of treatment is to maintain the following  blood glucose levels during pregnancy:  Before meals (preprandial): at or below 95 mg/dL (5.3 mmol/L).  After meals (postprandial): ? One hour after a meal: at or below 140 mg/dL (7.8 mmol/L). ? Two hours after a meal: at or below 120 mg/dL (6.7 mmol/L).  A1c (hemoglobin A1c) level: 6-6.5%. Follow these instructions at home: Questions to ask your health care provider  Consider asking the following questions: ? Do I need to meet with a diabetes educator? ? What equipment will I need to manage my diabetes at home? ? What diabetes medicines do I need, and when should I take them? ? How often do I need to check my blood glucose? ? What number can I call if I have questions? ? When is my next appointment? General instructions  Take over-the-counter and prescription medicines only as told by your health care provider.  Manage your weight gain during pregnancy. The amount of weight that you are expected to gain depends on your pre-pregnancy BMI (body mass index).  Keep all follow-up visits as told by your health care provider. This is important.  For more information about diabetes, visit: ? American Diabetes Association (ADA): www.diabetes.org ? American Association of Diabetes Educators (AADE): www.diabeteseducator.org Contact a health care provider if:  Your blood glucose level is at or above 240 mg/dL (13.3 mmol/L).  Your blood glucose level is at or above 200 mg/dL (11.1 mmol/L) and you have ketones in your urine.  You have been sick or have had a fever for 2 days or longer and you are not getting better.  You have any of the following problems for more than 6 hours: ? You cannot eat or drink. ? You have nausea and vomiting. ? You have diarrhea. Get help right away if:  Your blood glucose is lower than 54 mg/dL (3 mmol/L).  You become confused or you have trouble thinking clearly.  You have difficulty breathing.  You have moderate or large ketone levels in your urine.   Your baby is moving around less than usual.  You develop unusual discharge or bleeding from your vagina.  You start having contractions early (prematurely). Contractions may feel like a tightening in your lower abdomen. Summary  Gestational diabetes (gestational diabetes mellitus) is a temporary form of diabetes that some women develop during pregnancy. It usually occurs around weeks 24-28 of pregnancy, and it goes away after delivery.  This condition is treated by making diet and lifestyle changes and taking diabetes medicines or insulin, if needed.  Women who get gestational diabetes are more likely to develop it if they get pregnant again, and they are more likely to develop type 2 diabetes in the future. This information is not intended to replace advice given to you by your health care provider. Make sure you discuss any questions you have with your health care provider. Document Released: 01/20/2001 Document Revised: 11/20/2017 Document Reviewed: 11/17/2015 Elsevier Patient Education  2020 [employer].

## 2019-08-06 ENCOUNTER — Encounter (HOSPITAL_COMMUNITY): Payer: Self-pay

## 2019-08-06 ENCOUNTER — Telehealth: Payer: Self-pay | Admitting: *Deleted

## 2019-08-06 ENCOUNTER — Inpatient Hospital Stay (HOSPITAL_COMMUNITY)
Admission: AD | Admit: 2019-08-06 | Discharge: 2019-08-06 | Disposition: A | Discharge status: Home / Self Care | Payer: Commercial Managed Care - PPO | Attending: Obstetrics and Gynecology | Admitting: Obstetrics and Gynecology

## 2019-08-06 ENCOUNTER — Ambulatory Visit: Payer: Self-pay

## 2019-08-06 ENCOUNTER — Other Ambulatory Visit: Payer: Self-pay

## 2019-08-06 DIAGNOSIS — O368131 Decreased fetal movements, third trimester, fetus 1: Secondary | ICD-10-CM

## 2019-08-06 DIAGNOSIS — O368130 Decreased fetal movements, third trimester, not applicable or unspecified: Secondary | ICD-10-CM | POA: Insufficient documentation

## 2019-08-06 DIAGNOSIS — Z880 Allergy status to penicillin: Secondary | ICD-10-CM | POA: Diagnosis not present

## 2019-08-06 DIAGNOSIS — Z3A31 31 weeks gestation of pregnancy: Secondary | ICD-10-CM | POA: Insufficient documentation

## 2019-08-06 DIAGNOSIS — Z3689 Encounter for other specified antenatal screening: Secondary | ICD-10-CM

## 2019-08-06 DIAGNOSIS — Z87891 Personal history of nicotine dependence: Secondary | ICD-10-CM | POA: Diagnosis not present

## 2019-08-06 DIAGNOSIS — O24419 Gestational diabetes mellitus in pregnancy, unspecified control: Secondary | ICD-10-CM | POA: Diagnosis not present

## 2019-08-06 LAB — URINALYSIS, ROUTINE W REFLEX MICROSCOPIC
Bilirubin Urine: NEGATIVE
Glucose, UA: NEGATIVE mg/dL
Hgb urine dipstick: NEGATIVE
Ketones, ur: NEGATIVE mg/dL
Nitrite: NEGATIVE
Protein, ur: NEGATIVE mg/dL
Specific Gravity, Urine: 1.015 (ref 1.005–1.030)
pH: 7.5 (ref 5.0–8.0)

## 2019-08-06 LAB — URINALYSIS, MICROSCOPIC (REFLEX): RBC / HPF: NONE SEEN RBC/hpf (ref 0–5)

## 2019-08-06 NOTE — MAU Note (Signed)
Pt presents to MAU with c/o decreased fetal movement since yesterday. She tried eating and drinking, has not felt much movement with that. Pt denies VB and LOF.

## 2019-08-06 NOTE — MAU Provider Note (Signed)
Patient Brandi Andrews is a 28 y.o. G2P0010 At 49w2dhere with complaints of decreased fetal movements over the past two days. She denies pain, vaginal bleeding, contractions, LOF. She was recently diagnosed with gestational diabetes; she reports that her sugars have been "normal", a few elevated here and there; including fastings that are above 95 occasional (she snacks in the middle of the night).  She says that she feels active movements now.  History     CSN: [id number] Arrival date and time: 08/06/19 1150   First Provider Initiated Contact with Patient 08/06/19 1345      Chief Complaint  Patient presents with  . Decreased Fetal Movement   HPI  Patient states that the baby has been moving less over the past two days. .  Yesterday evening when she got home from work she didn't feel him move. She drank half a glass of mountain dew and cookies, and she ate dinner. She then watched TV before going to bed. Her usual routine is to wake up in the middle of the night and watch TV. This time, when she woke up at 1 am, she tried drinking sugar water to see if he moved. She said she felt a few movements from 1-5. She was watching TV in that time period.   She is on her feet the majority of the day (she works in [employer].  She did not do the fetal kickcount protocol.  OB History    Gravida  2   Para      Term      Preterm      AB  1   Living  0     SAB  1   TAB      Ectopic      Multiple      Live Births  0        Obstetric Comments  G1: 2012 early SAB        Past Medical History:  Diagnosis Date  . Medical history non-contributory   . Obesity   . Ovarian cyst     Past Surgical History:  Procedure Laterality Date  . EYE SURGERY    . LAPAROSCOPIC SALPINGO OOPHERECTOMY Right 03/03/2018   Procedure: LAPAROSCOPIC SALPINGO OOPHORECTOMY;  Surgeon: AOsborne Oman MD;  [facility];  Service: Gynecology;  Laterality: Right;    Family History  Problem  Relation Age of Onset  . Diabetes Mother   . Breast cancer Mother 51 . Hypertension Father     Social History   Tobacco Use  . Smoking status: Former Smoker    Packs/day: 0.10    Years: 4.00    Pack years: 0.40    Types: Cigarettes  . Smokeless tobacco: Never Used  Substance Use Topics  . Alcohol use: Not Currently  . Drug use: No    Allergies:  Allergies  Allergen Reactions  . Mushroom Extract Complex Swelling and Other (See Comments)    Lips swell and patient gets dizzy  . Penicillins Other (See Comments)    Was told from childhood: Has patient had a PCN reaction causing immediate rash, facial/tongue/throat swelling, SOB or lightheadedness with hypotension: Unknown Has patient had a PCN reaction causing severe rash involving mucus membranes or skin necrosis: Unknown Has patient had a PCN reaction that required hospitalization: Unknown Has patient had a PCN reaction occurring within the last 10 years: No If all of the above answers are "NO", then may proceed with Cephalosporin use.  Medications Prior to Admission  Medication Sig Dispense Refill Last Dose  . AMBULATORY NON FORMULARY MEDICATION 1 Device by Other route once a week. Blood Pressure Cuff Large Monitored Regularly at home ICD 10:Z34.90 1 kit 0   . Blood Glucose Monitoring Suppl (ACCU-CHEK GUIDE ME) w/Device KIT See admin instructions.     Marland Kitchen glucose blood test strip Test Blood Glucose 4 x a day. Fasting and 2 hours after breakfast, lunch and dinner. 100 each 12   . Lancets (ONETOUCH ULTRASOFT) lancets Use as instructed 100 each 12   . Prenatal Vit-Fe Fumarate-FA (PRENATAL PO) Take by mouth.       Review of Systems  Constitutional: Negative.   HENT: Negative.   Respiratory: Negative.   Cardiovascular: Negative.   Gastrointestinal: Negative.   Musculoskeletal: Negative.   Neurological: Negative.   Psychiatric/Behavioral: Negative.    Physical Exam   Blood pressure 114/62, pulse 95, temperature  98.7 F (37.1 C), temperature source Oral, resp. rate 20, last menstrual period 12/30/2018, SpO2 98 %.  Physical Exam  Constitutional: She is oriented to person, place, and time. She appears well-developed and well-nourished.  HENT:  Head: Normocephalic.  Eyes: Pupils are equal, round, and reactive to light.  Neck: Normal range of motion.  Respiratory: Effort normal.  GI: Soft.  Musculoskeletal: Normal range of motion.  Neurological: She is alert and oriented to person, place, and time.  Skin: Skin is warm and dry.    MAU Course  Procedures  MDM NST: 140 bpm, mod var, present acel, neg decels.  Patient feels fetal movements while in MAU.   Assessment and Plan   1. NST (non-stress test) reactive    -Reviewed patient's NST with her and reassured her of findings.  -Described fetal kick counts and how to monitor movements.  -Bring blood sugars to next appt on Thursday; recommended trying melatonin for sleep and to have high protein snack before bed to avoid elevated sugars in the morning.   Mervyn Skeeters Kasson Lamere 08/06/2019, 1:45 PM

## 2019-08-06 NOTE — Telephone Encounter (Signed)
Brandi Andrews left a voice message this am at 10:39 that she wasn't sure if it is normal that she isn't feeling as much fetal movement past 2 days. I called [name] and she reports she usually feels the baby move a lot all throughout the day. States yesterday felt baby move much less and again today not much movement. I advised her to go to [facility]U for evaluation today asap to check on baby. She voices understanding and states she will go .  Jacques Navy

## 2019-08-06 NOTE — Telephone Encounter (Signed)
Incoming call from patient  With a complaint of less movement of baby  After drinking sweet drink . Kick count inproved som what. Not like usal.  Reviewed protocol  with Pt.  Recommend  patyient go to [facility] for further evaluation.  Pt. Voiced understanding.       Reason for Disposition . [1] Pregnant 23 or more weeks AND [2] baby moving less today by kick count  (e.g., kick count < 5 in 1 hour or < 10 in 2 hours)  Answer Assessment - Initial Assessment Questions 1. FETAL MOVEMENT: "Has the baby's movement decreased or changed significantly from normal?" (e.g., yes, no; describe)     Tried sugar water , soda 2. EDD: "What date are you expecting to deliver?"      10/06/19 3. PREGNANCY: "How many weeks pregnant are you?"      31 2/7 4. OTHER SYMPTOMS: "Do you have any other symptoms?" (e.g., abdominal pain, leaking fluid from vagina, vaginal bleeding, etc.)    Denies  Protocols used: PREGNANCY - DECREASED FETAL MOVEMENT-A-AH

## 2019-08-06 NOTE — Discharge Instructions (Signed)

## 2019-08-12 ENCOUNTER — Ambulatory Visit (HOSPITAL_COMMUNITY)
Admission: RE | Admit: 2019-08-12 | Discharge: 2019-08-12 | Disposition: A | Discharge status: Home / Self Care | Payer: Commercial Managed Care - PPO | Source: Ambulatory Visit | Attending: Obstetrics and Gynecology | Admitting: Obstetrics and Gynecology

## 2019-08-12 ENCOUNTER — Ambulatory Visit (INDEPENDENT_AMBULATORY_CARE_PROVIDER_SITE_OTHER): Payer: Commercial Managed Care - PPO | Admitting: Obstetrics and Gynecology

## 2019-08-12 ENCOUNTER — Encounter (HOSPITAL_COMMUNITY): Payer: Self-pay | Admitting: *Deleted

## 2019-08-12 ENCOUNTER — Ambulatory Visit (HOSPITAL_COMMUNITY): Payer: Commercial Managed Care - PPO | Admitting: *Deleted

## 2019-08-12 ENCOUNTER — Other Ambulatory Visit: Payer: Self-pay

## 2019-08-12 ENCOUNTER — Other Ambulatory Visit (HOSPITAL_COMMUNITY): Payer: Self-pay | Admitting: Obstetrics

## 2019-08-12 VITALS — BP 130/74 | HR 91 | Wt 267.7 lb

## 2019-08-12 DIAGNOSIS — O359XX0 Maternal care for (suspected) fetal abnormality and damage, unspecified, not applicable or unspecified: Secondary | ICD-10-CM | POA: Diagnosis not present

## 2019-08-12 DIAGNOSIS — O0990 Supervision of high risk pregnancy, unspecified, unspecified trimester: Secondary | ICD-10-CM

## 2019-08-12 DIAGNOSIS — O24415 Gestational diabetes mellitus in pregnancy, controlled by oral hypoglycemic drugs: Secondary | ICD-10-CM

## 2019-08-12 DIAGNOSIS — O0993 Supervision of high risk pregnancy, unspecified, third trimester: Secondary | ICD-10-CM

## 2019-08-12 DIAGNOSIS — O3413 Maternal care for benign tumor of corpus uteri, third trimester: Secondary | ICD-10-CM

## 2019-08-12 DIAGNOSIS — Z362 Encounter for other antenatal screening follow-up: Secondary | ICD-10-CM

## 2019-08-12 DIAGNOSIS — Z3A32 32 weeks gestation of pregnancy: Secondary | ICD-10-CM

## 2019-08-12 DIAGNOSIS — O99213 Obesity complicating pregnancy, third trimester: Secondary | ICD-10-CM

## 2019-08-12 DIAGNOSIS — Z9119 Patient's noncompliance with other medical treatment and regimen: Secondary | ICD-10-CM

## 2019-08-12 DIAGNOSIS — Z91199 Patient's noncompliance with other medical treatment and regimen due to unspecified reason: Secondary | ICD-10-CM

## 2019-08-12 MED ORDER — METFORMIN HCL 500 MG PO TABS: 500.0000 mg | ORAL_TABLET | Freq: Two times a day (BID) | ORAL | 2 refills | Status: DC

## 2019-08-12 NOTE — Progress Notes (Signed)
   PRENATAL VISIT NOTE  Subjective:  Brandi Andrews is a 28 y.o. G2P0010 at [redacted]w[redacted]d being seen today for ongoing prenatal care.  She is currently monitored for the following issues for this high-risk pregnancy and has BMI 40.0-44.9, adult ([place]); Cervical dysplasia; Noncompliance; Supervision of high risk pregnancy, antepartum; Subchorionic hematoma in second trimester; Sickle cell trait ([place]); UTI in pregnancy, antepartum; and Gestational diabetes on their problem list.  Patient reports no complaints.  Contractions: Not present. Vag. Bleeding: None.  Movement: Present. Denies leaking of fluid.   The following portions of the patient's history were reviewed and updated as appropriate: allergies, current medications, past family history, past medical history, past social history, past surgical history and problem list.   Objective:   Vitals:   08/12/19 1010  BP: 130/74  Pulse: 91  Weight: 267 lb 11.2 oz (121.4 kg)    Fetal Status: Fetal Heart Rate (bpm): 164   Movement: Present     General:  Alert, oriented and cooperative. Patient is in no acute distress.  Skin: Skin is warm and dry. No rash noted.   Cardiovascular: Normal heart rate noted  Respiratory: Normal respiratory effort, no problems with respiration noted  Abdomen: Soft, gravid, appropriate for gestational age.  Pain/Pressure: Absent     Pelvic: Cervical exam deferred        Extremities: Normal range of motion.  Edema: None  Mental Status: Normal mood and affect. Normal behavior. Normal judgment and thought content.   Assessment and Plan:  Pregnancy: G2P0010 at [redacted]w[redacted]d  1. Gestational diabetes mellitus (GDM) controlled on oral hypoglycemic drug, antepartum  Consulted with Dr. Rosana Hoes regarding BS readings. She should start BID metformin. Patient is ok with me sending the RX however does not feel the medication is necessary. She will consider it however is very hesitant to take any medications in pregnancy.   2. Supervision  of high risk pregnancy, antepartum  Reviewed BS readings with patient. Fasting BS elevated with highest being 138. States she wakes up at 0200 and eats because she is awake at that time. She is eating fruit mostly at that time. Says her Fasting BS are not truly fasting for this reason.   Lengthy discussion about importance of testing blood sugars, bringing log book and keeping appointments. Discussed risks of uncontrolled DM in pregnancy including delayed fetal lung maturity, IUFD and shoulder dystocia possibly resulting brachial plexus palsy, brain damage, intrapartum death and extensive obstetric lacerations. Patient verbalizes understanding.  26w EFW 1043g @ 82%   3. Noncompliance  Needs to start antenatal testing with MFM MD visits only  Not taking BASA   There are no diagnoses linked to this encounter. Preterm labor symptoms and general obstetric precautions including but not limited to vaginal bleeding, contractions, leaking of fluid and fetal movement were reviewed in detail with the patient. Please refer to After Visit Summary for other counseling recommendations.   Return in about 1 week (around 08/19/2019), or MD  only, please change her schedule,  needs weekly antenatal testing with MFM starting this week.  Future Appointments  Date Time Provider [place]  08/26/2019  9:35 AM Emily Filbert, MD [facility]  09/01/2019  9:30 AM Billie Ruddy, MD [facility]  09/09/2019 10:15 AM Donnamae Jude, MD [facility]  09/16/2019  9:15 AM Donnamae Jude, MD [facility]  09/22/2019  9:15 AM Donnamae Jude, MD [place], NP

## 2019-08-13 LAB — URINE CULTURE

## 2019-08-17 ENCOUNTER — Other Ambulatory Visit (HOSPITAL_COMMUNITY): Payer: Self-pay | Admitting: *Deleted

## 2019-08-17 DIAGNOSIS — O24419 Gestational diabetes mellitus in pregnancy, unspecified control: Secondary | ICD-10-CM

## 2019-08-20 ENCOUNTER — Ambulatory Visit (HOSPITAL_COMMUNITY)
Admission: RE | Admit: 2019-08-20 | Discharge: 2019-08-20 | Disposition: A | Discharge status: Home / Self Care | Payer: Commercial Managed Care - PPO | Source: Ambulatory Visit | Attending: Obstetrics | Admitting: Obstetrics

## 2019-08-20 ENCOUNTER — Encounter (HOSPITAL_COMMUNITY): Payer: Self-pay

## 2019-08-20 ENCOUNTER — Ambulatory Visit (HOSPITAL_COMMUNITY): Payer: Commercial Managed Care - PPO

## 2019-08-26 ENCOUNTER — Other Ambulatory Visit: Payer: Self-pay

## 2019-08-26 ENCOUNTER — Telehealth: Payer: Self-pay | Admitting: Obstetrics & Gynecology

## 2019-08-26 ENCOUNTER — Telehealth (INDEPENDENT_AMBULATORY_CARE_PROVIDER_SITE_OTHER): Payer: Commercial Managed Care - PPO | Admitting: Obstetrics & Gynecology

## 2019-08-26 VITALS — BP 116/83 | HR 72 | Wt 261.8 lb

## 2019-08-26 DIAGNOSIS — D573 Sickle-cell trait: Secondary | ICD-10-CM

## 2019-08-26 DIAGNOSIS — O24415 Gestational diabetes mellitus in pregnancy, controlled by oral hypoglycemic drugs: Secondary | ICD-10-CM

## 2019-08-26 DIAGNOSIS — O99013 Anemia complicating pregnancy, third trimester: Secondary | ICD-10-CM

## 2019-08-26 DIAGNOSIS — O0990 Supervision of high risk pregnancy, unspecified, unspecified trimester: Secondary | ICD-10-CM

## 2019-08-26 DIAGNOSIS — Z3A34 34 weeks gestation of pregnancy: Secondary | ICD-10-CM

## 2019-08-26 DIAGNOSIS — Z6841 Body Mass Index (BMI) 40.0 and over, adult: Secondary | ICD-10-CM

## 2019-08-26 DIAGNOSIS — O0993 Supervision of high risk pregnancy, unspecified, third trimester: Secondary | ICD-10-CM

## 2019-08-26 NOTE — Progress Notes (Signed)
[place] VIRTUAL VIDEO VISIT ENCOUNTER NOTE  Provider location: [facility] [place]   I connected with Brandi Andrews on 08/26/19 at  9:35 AM EDT by MyChart Video Encounter at home and verified that I am speaking with the correct person using two identifiers.   I discussed the limitations, risks, security and privacy concerns of performing an evaluation and management service virtually and the availability of in person appointments. I also discussed with the patient that there may be a patient responsible charge related to this service. The patient expressed understanding and agreed to proceed. Subjective:  Brandi VOLTA is a 28 y.o. G2P0010 at [redacted]w[redacted]d being seen today for ongoing prenatal care.  She is currently monitored for the following issues for this high-risk pregnancy and has BMI 40.0-44.9, adult ([place]); Cervical dysplasia; Noncompliance; Supervision of high risk pregnancy, antepartum; Subchorionic hematoma in second trimester; Sickle cell trait ([place]); UTI in pregnancy, antepartum; and Gestational diabetes on their problem list.  Patient reports no complaints.   .  .   . Denies any leaking of fluid.   The following portions of the patient's history were reviewed and updated as appropriate: allergies, current medications, past family history, past medical history, past social history, past surgical history and problem list.   Objective:  There were no vitals filed for this visit.  Fetal Status:           General:  Alert, oriented and cooperative. Patient is in no acute distress.  Respiratory: Normal respiratory effort, no problems with respiration noted  Mental Status: Normal mood and affect. Normal behavior. Normal judgment and thought content.  Rest of physical exam deferred due to type of encounter  Imaging: [place] Mfm Fetal Bpp Wo Non Stress  Result Date:  08/12/2019 ----------------------------------------------------------------------  OBSTETRICS REPORT                       (Signed Final 08/12/2019 12:29 pm) ---------------------------------------------------------------------- Patient Info  ID #:       [id number]                          D.O.B.:  [birth year] (28 yrs)  Name:       Brandi Andrews               Visit Date: 08/12/2019 11:16 am ---------------------------------------------------------------------- Performed By  Performed By:     Valda Favia          Referred By:      Irene Limbo  Attending:        Tama High MD        [facility] ---------------------------------------------------------------------- Orders   #  Description  Code         Ordered By   1  [place] MFM OB FOLLOW UP                  [id number]     YU FANG   2  [place] MFM FETAL BPP WO NON              [id number]     YU FANG      STRESS  ----------------------------------------------------------------------   #  Order #                    Accession #                 Episode #   1  [id number]                  [id number]   2  [id number]                  [id number]  ---------------------------------------------------------------------- Indications   [redacted] weeks gestation of pregnancy                0000000   Obesity complicating pregnancy, second         O99.212   trimester (BMI 37)   Fetal abnormality - other known or             O35.9XX0   suspected (absent left kidney)   Uterine fibroids                               O34.10   Encounter for antenatal screening for          Z36.3   malformations   Gestational diabetes in pregnancy,             O24.415   controlled by oral hypoglycemic drugs   (metformin)   ---------------------------------------------------------------------- Vital Signs                                                 Height:        5'7" ---------------------------------------------------------------------- Fetal Evaluation  Num Of Fetuses:         1  Fetal Heart Rate(bpm):  143  Cardiac Activity:       Observed  Presentation:           Cephalic  Placenta:               Posterior  P. Cord Insertion:      Previously Visualized  Amniotic Fluid  AFI FV:      Within normal limits  AFI Sum(cm)     %Tile       Largest Pocket(cm)  14.96           53          4.7  RUQ(cm)       RLQ(cm)       LUQ(cm)        LLQ(cm)  4.7           3.46  3.09           3.71 ---------------------------------------------------------------------- Biophysical Evaluation  Amniotic F.V:   Within normal limits       F. Tone:        Observed  F. Movement:    Observed                   Score:          8/8  F. Breathing:   Observed ---------------------------------------------------------------------- Biometry  BPD:      83.2  mm     G. Age:  33w 3d         79  %    CI:        74.74   %    70 - 86                                                          FL/HC:      20.8   %    19.1 - 21.3  HC:      305.4  mm     G. Age:  34w 0d         63  %    HC/AC:      1.05        0.96 - 1.17  AC:      291.9  mm     G. Age:  33w 1d         78  %    FL/BPD:     76.3   %    71 - 87  FL:       63.5  mm     G. Age:  32w 6d         56  %    FL/AC:      21.8   %    20 - 24  HUM:      59.1  mm     G. Age:  34w 1d         95  %  Est. FW:    2139  gm    4 lb 11 oz      73  % ---------------------------------------------------------------------- OB History  Gravidity:    2         Term:   0        Prem:   0        SAB:   1  TOP:          0       Ectopic:  0        Living: 0 ---------------------------------------------------------------------- Gestational Age  LMP:           32w 1d        Date:  12/30/18                 EDD:   10/06/19  U/S Today:      33w 3d                                        EDD:   09/27/19  Best:          32w 1d     Det.  By:  LMP  (12/30/18)          EDD:   10/06/19 ---------------------------------------------------------------------- Anatomy  Cranium:               Appears normal         Aortic Arch:            Previously seen  Cavum:                 Previously seen        Ductal Arch:            Previously seen  Ventricles:            Appears normal         Diaphragm:              Previously seen  Choroid Plexus:        Previously seen        Stomach:                Appears normal, left                                                                        sided  Cerebellum:            Previously seen        Abdomen:                Previously seen  Posterior Fossa:       Previously seen        Abdominal Wall:         Previously seen  Nuchal Fold:           Previously seen        Cord Vessels:           Previously seen  Face:                  Orbits previously      Kidneys:                Abnormal, see                         seen                                                                        comments  Lips:                  Previously seen        Bladder:                Appears normal  Thoracic:              Appears normal         Spine:                  Previously seen  Heart:  Previously seen        Upper Extremities:      Previously seen  RVOT:                  Previously seen        Lower Extremities:      Previously seen  LVOT:                  Previously seen  Other:  Female gender previously seen. Technically difficult due to maternal          habitus and fetal position. ---------------------------------------------------------------------- Cervix Uterus Adnexa  Cervix  Not visualized (advanced GA >24wks)  Uterus  No abnormality visualized.  Left Ovary  No adnexal mass visualized.  Right Ovary  No adnexal mass visualized.  Cul De Sac  No free fluid seen.  Adnexa  No abnormality visualized.  ---------------------------------------------------------------------- Impression  Gestational diabetes.  Patient reports her fasting levels are  high and she was prescribed metformin today.  Postprandial  levels seem to be within normal limits.  On ultrasound, amniotic fluid is normal and good fetal activity  seen.  Fetal growth is appropriate for gestational age.  Antenatal testing is reassuring.  BPP 8/8.  Left kidney is  absent.  I discussed the importance of good control of diabetes to  prevent adverse fetal and neonatal outcomes. ---------------------------------------------------------------------- Recommendations  -Continue weekly BPP till delivery. ----------------------------------------------------------------------                  Tama High, MD Electronically Signed Final Report   08/12/2019 12:29 pm ----------------------------------------------------------------------  [place] Mfm Ob Follow Up  Result Date: 08/12/2019 ----------------------------------------------------------------------  OBSTETRICS REPORT                       (Signed Final 08/12/2019 12:29 pm) ---------------------------------------------------------------------- Patient Info  ID #:       [id number]                          D.O.B.:  Sep 25, 1991 (28 yrs)  Name:       Brandi Andrews               Visit Date: 08/12/2019 11:16 am ---------------------------------------------------------------------- Performed By  Performed By:     Valda Favia          Referred By:      Irene Limbo  Attending:        Tama High MD        [facility] ---------------------------------------------------------------------- Orders   #  Description                          Code  Ordered By   1  [place] MFM OB FOLLOW UP                  [id number]     YU FANG   2  [place] MFM FETAL BPP WO NON               [id number]     YU FANG      STRESS  ----------------------------------------------------------------------   #  Order #                    Accession #                 Episode #   1  [id number]                  [id number]   2  [id number]                  [id number]  ---------------------------------------------------------------------- Indications   [redacted] weeks gestation of pregnancy                0000000   Obesity complicating pregnancy, second         O99.212   trimester (BMI 37)   Fetal abnormality - other known or             O35.9XX0   suspected (absent left kidney)   Uterine fibroids                               O34.10   Encounter for antenatal screening for          Z36.3   malformations   Gestational diabetes in pregnancy,             O24.415   controlled by oral hypoglycemic drugs   (metformin)  ---------------------------------------------------------------------- Vital Signs                                                 Height:        5'7" ---------------------------------------------------------------------- Fetal Evaluation  Num Of Fetuses:         1  Fetal Heart Rate(bpm):  143  Cardiac Activity:       Observed  Presentation:           Cephalic  Placenta:               Posterior  P. Cord Insertion:      Previously Visualized  Amniotic Fluid  AFI FV:      Within normal limits  AFI Sum(cm)     %Tile       Largest Pocket(cm)  14.96           53          4.7  RUQ(cm)       RLQ(cm)       LUQ(cm)        LLQ(cm)  4.7           3.46          3.09  3.71 ---------------------------------------------------------------------- Biophysical Evaluation  Amniotic F.V:   Within normal limits       F. Tone:        Observed  F. Movement:    Observed                   Score:          8/8  F. Breathing:   Observed ---------------------------------------------------------------------- Biometry  BPD:      83.2  mm     G. Age:  33w 3d         79  %    CI:         74.74   %    70 - 86                                                          FL/HC:      20.8   %    19.1 - 21.3  HC:      305.4  mm     G. Age:  34w 0d         63  %    HC/AC:      1.05        0.96 - 1.17  AC:      291.9  mm     G. Age:  33w 1d         78  %    FL/BPD:     76.3   %    71 - 87  FL:       63.5  mm     G. Age:  32w 6d         56  %    FL/AC:      21.8   %    20 - 24  HUM:      59.1  mm     G. Age:  34w 1d         95  %  Est. FW:    2139  gm    4 lb 11 oz      73  % ---------------------------------------------------------------------- OB History  Gravidity:    2         Term:   0        Prem:   0        SAB:   1  TOP:          0       Ectopic:  0        Living: 0 ---------------------------------------------------------------------- Gestational Age  LMP:           32w 1d        Date:  12/30/18                 EDD:   10/06/19  U/S Today:     33w 3d                                        EDD:   09/27/19  Best:          32w 1d     Det. By:  LMP  (12/30/18)  EDD:   10/06/19 ---------------------------------------------------------------------- Anatomy  Cranium:               Appears normal         Aortic Arch:            Previously seen  Cavum:                 Previously seen        Ductal Arch:            Previously seen  Ventricles:            Appears normal         Diaphragm:              Previously seen  Choroid Plexus:        Previously seen        Stomach:                Appears normal, left                                                                        sided  Cerebellum:            Previously seen        Abdomen:                Previously seen  Posterior Fossa:       Previously seen        Abdominal Wall:         Previously seen  Nuchal Fold:           Previously seen        Cord Vessels:           Previously seen  Face:                  Orbits previously      Kidneys:                Abnormal, see                         seen                                                                         comments  Lips:                  Previously seen        Bladder:                Appears normal  Thoracic:              Appears normal         Spine:                  Previously seen  Heart:                 Previously seen  Upper Extremities:      Previously seen  RVOT:                  Previously seen        Lower Extremities:      Previously seen  LVOT:                  Previously seen  Other:  Female gender previously seen. Technically difficult due to maternal          habitus and fetal position. ---------------------------------------------------------------------- Cervix Uterus Adnexa  Cervix  Not visualized (advanced GA >24wks)  Uterus  No abnormality visualized.  Left Ovary  No adnexal mass visualized.  Right Ovary  No adnexal mass visualized.  Cul De Sac  No free fluid seen.  Adnexa  No abnormality visualized. ---------------------------------------------------------------------- Impression  Gestational diabetes.  Patient reports her fasting levels are  high and she was prescribed metformin today.  Postprandial  levels seem to be within normal limits.  On ultrasound, amniotic fluid is normal and good fetal activity  seen.  Fetal growth is appropriate for gestational age.  Antenatal testing is reassuring.  BPP 8/8.  Left kidney is  absent.  I discussed the importance of good control of diabetes to  prevent adverse fetal and neonatal outcomes. ---------------------------------------------------------------------- Recommendations  -Continue weekly BPP till delivery. ----------------------------------------------------------------------                  Tama High, MD Electronically Signed Final Report   08/12/2019 12:29 pm ----------------------------------------------------------------------   Assessment and Plan:  Pregnancy: G2P0010 at [redacted]w[redacted]d 1. Gestational diabetes mellitus (GDM) controlled on oral hypoglycemic drug, antepartum - should be getting weekly testing, has MFM BPP  scheduled tomorrow - Hemoglobin A1c; Future - fasting today was 84, yesterday 84, yesterday 98 after breakfast and 110 after lunch - She is supposed to take 500mg  BID but she is only taking it at night. - She says that she is skeptical of taking it.   2. Supervision of high risk pregnancy, antepartum - cervical cultures at next visit  3. Sickle cell trait ([place])   4. BMI 40.0-44.9, adult ([place])   Preterm labor symptoms and general obstetric precautions including but not limited to vaginal bleeding, contractions, leaking of fluid and fetal movement were reviewed in detail with the patient. I discussed the assessment and treatment plan with the patient. The patient was provided an opportunity to ask questions and all were answered. The patient agreed with the plan and demonstrated an understanding of the instructions. The patient was advised to call back or seek an in-person office evaluation/go to [facility] at [facility] for any urgent or concerning symptoms. Please refer to After Visit Summary for other counseling recommendations.   I provided 10 minutes of face-to-face time during this encounter.  Return for She needs weekly in person visits on the days that she gets her BPPs (not tomorrow).  Future Appointments  Date Time Provider [place]  08/27/2019  3:45 PM [place] [facility]  08/27/2019  3:45 PM [place] 2 WH-MFCUS [facility]  09/01/2019  9:30 AM Billie Ruddy, MD [facility] P[facility]  09/02/2019  1:40 PM [place] NURSE [place] [facility]  09/02/2019  1:45 PM [place] 2 WH-MFCUS [facility]-US  09/09/2019 10:15 AM Donnamae Jude, MD [facility] WOC  09/10/2019 11:15 AM [place] NURSE [place] [facility]-US  09/10/2019 11:15 AM WH-MFC [place] 4 WH-MFCUS MFC-US  09/16/2019  9:15 AM Donnamae Jude, MD [place]  09/22/2019  9:15 AM Donnamae Jude, MD [place], MD [facility] [place]

## 2019-08-26 NOTE — Telephone Encounter (Signed)
Per the providers note to change the patient's appointment to the same date of ultrasound. No options for it as there are no MD's available on the day requested. The appointment were left as is. The upcoming appointment however were changed to in person visits.

## 2019-08-27 ENCOUNTER — Other Ambulatory Visit: Payer: Self-pay

## 2019-08-27 ENCOUNTER — Ambulatory Visit (HOSPITAL_COMMUNITY)
Admission: RE | Admit: 2019-08-27 | Discharge: 2019-08-27 | Disposition: A | Discharge status: Home / Self Care | Payer: Commercial Managed Care - PPO | Source: Ambulatory Visit | Attending: Obstetrics | Admitting: Obstetrics

## 2019-08-27 ENCOUNTER — Encounter (HOSPITAL_COMMUNITY): Payer: Self-pay

## 2019-08-27 ENCOUNTER — Other Ambulatory Visit (HOSPITAL_COMMUNITY): Payer: Self-pay | Admitting: Obstetrics

## 2019-08-27 ENCOUNTER — Ambulatory Visit (HOSPITAL_COMMUNITY): Payer: Commercial Managed Care - PPO

## 2019-08-27 ENCOUNTER — Ambulatory Visit (HOSPITAL_COMMUNITY): Payer: Commercial Managed Care - PPO | Admitting: *Deleted

## 2019-08-27 DIAGNOSIS — O0990 Supervision of high risk pregnancy, unspecified, unspecified trimester: Secondary | ICD-10-CM | POA: Insufficient documentation

## 2019-08-27 DIAGNOSIS — O3413 Maternal care for benign tumor of corpus uteri, third trimester: Secondary | ICD-10-CM

## 2019-08-27 DIAGNOSIS — O24415 Gestational diabetes mellitus in pregnancy, controlled by oral hypoglycemic drugs: Secondary | ICD-10-CM

## 2019-08-27 DIAGNOSIS — O24419 Gestational diabetes mellitus in pregnancy, unspecified control: Secondary | ICD-10-CM | POA: Insufficient documentation

## 2019-08-27 DIAGNOSIS — O99213 Obesity complicating pregnancy, third trimester: Secondary | ICD-10-CM

## 2019-08-27 DIAGNOSIS — Z3A34 34 weeks gestation of pregnancy: Secondary | ICD-10-CM

## 2019-08-27 DIAGNOSIS — O359XX0 Maternal care for (suspected) fetal abnormality and damage, unspecified, not applicable or unspecified: Secondary | ICD-10-CM

## 2019-09-01 ENCOUNTER — Ambulatory Visit: Payer: Commercial Managed Care - PPO | Admitting: Family Medicine

## 2019-09-01 ENCOUNTER — Telehealth: Payer: Self-pay

## 2019-09-01 NOTE — Telephone Encounter (Signed)
Copied from [place] [phone]. Topic: General - Other >> Sep 01, 2019  9:36 AM Leward Quan A wrote: Reason for CRM: Patient called back to say that she would like a call back if someone cancels today for her to see Dr Volanda Napoleon. Can be reached at  Ph#  [phone]

## 2019-09-01 NOTE — Telephone Encounter (Signed)
Called pt LVM to return my call in the office regarding her request to have the office call her if there was any cancellation on today schedule.

## 2019-09-02 ENCOUNTER — Ambulatory Visit (HOSPITAL_COMMUNITY)
Admission: RE | Admit: 2019-09-02 | Discharge: 2019-09-02 | Disposition: A | Discharge status: Home / Self Care | Payer: Commercial Managed Care - PPO | Source: Ambulatory Visit | Attending: Obstetrics | Admitting: Obstetrics

## 2019-09-02 ENCOUNTER — Other Ambulatory Visit: Payer: Commercial Managed Care - PPO

## 2019-09-02 ENCOUNTER — Encounter (HOSPITAL_COMMUNITY): Payer: Self-pay

## 2019-09-02 ENCOUNTER — Other Ambulatory Visit: Payer: Self-pay

## 2019-09-02 ENCOUNTER — Ambulatory Visit (HOSPITAL_COMMUNITY): Payer: Commercial Managed Care - PPO | Admitting: *Deleted

## 2019-09-02 DIAGNOSIS — O24415 Gestational diabetes mellitus in pregnancy, controlled by oral hypoglycemic drugs: Secondary | ICD-10-CM

## 2019-09-02 DIAGNOSIS — Z3A35 35 weeks gestation of pregnancy: Secondary | ICD-10-CM

## 2019-09-02 DIAGNOSIS — O99213 Obesity complicating pregnancy, third trimester: Secondary | ICD-10-CM

## 2019-09-02 DIAGNOSIS — O24419 Gestational diabetes mellitus in pregnancy, unspecified control: Secondary | ICD-10-CM | POA: Diagnosis present

## 2019-09-02 DIAGNOSIS — O0990 Supervision of high risk pregnancy, unspecified, unspecified trimester: Secondary | ICD-10-CM | POA: Diagnosis present

## 2019-09-02 DIAGNOSIS — O3413 Maternal care for benign tumor of corpus uteri, third trimester: Secondary | ICD-10-CM | POA: Diagnosis not present

## 2019-09-02 DIAGNOSIS — O359XX0 Maternal care for (suspected) fetal abnormality and damage, unspecified, not applicable or unspecified: Secondary | ICD-10-CM

## 2019-09-03 ENCOUNTER — Ambulatory Visit (HOSPITAL_COMMUNITY): Payer: Commercial Managed Care - PPO

## 2019-09-03 LAB — HEMOGLOBIN A1C
Est. average glucose Bld gHb Est-mCnc: 111 mg/dL
Hgb A1c MFr Bld: 5.5 % (ref 4.8–5.6)

## 2019-09-04 ENCOUNTER — Encounter: Payer: Self-pay | Admitting: Family Medicine

## 2019-09-09 ENCOUNTER — Ambulatory Visit (INDEPENDENT_AMBULATORY_CARE_PROVIDER_SITE_OTHER): Payer: Commercial Managed Care - PPO | Admitting: Family Medicine

## 2019-09-09 ENCOUNTER — Other Ambulatory Visit: Payer: Self-pay

## 2019-09-09 ENCOUNTER — Other Ambulatory Visit (HOSPITAL_COMMUNITY)
Admission: RE | Admit: 2019-09-09 | Discharge: 2019-09-09 | Disposition: A | Discharge status: Home / Self Care | Payer: Commercial Managed Care - PPO | Source: Ambulatory Visit | Attending: Obstetrics and Gynecology | Admitting: Obstetrics and Gynecology

## 2019-09-09 VITALS — BP 111/62 | HR 86 | Wt 265.9 lb

## 2019-09-09 DIAGNOSIS — Z3A36 36 weeks gestation of pregnancy: Secondary | ICD-10-CM

## 2019-09-09 DIAGNOSIS — O24415 Gestational diabetes mellitus in pregnancy, controlled by oral hypoglycemic drugs: Secondary | ICD-10-CM

## 2019-09-09 DIAGNOSIS — O0990 Supervision of high risk pregnancy, unspecified, unspecified trimester: Secondary | ICD-10-CM | POA: Diagnosis present

## 2019-09-09 DIAGNOSIS — O0993 Supervision of high risk pregnancy, unspecified, third trimester: Secondary | ICD-10-CM

## 2019-09-09 LAB — POCT URINALYSIS DIP (DEVICE)
Bilirubin Urine: NEGATIVE
Glucose, UA: NEGATIVE mg/dL
Ketones, ur: 15 mg/dL — AB
Nitrite: NEGATIVE
Protein, ur: NEGATIVE mg/dL
Specific Gravity, Urine: 1.02 (ref 1.005–1.030)
Urobilinogen, UA: 1 mg/dL (ref 0.0–1.0)
pH: 6.5 (ref 5.0–8.0)

## 2019-09-09 NOTE — Progress Notes (Signed)
    PRENATAL VISIT NOTE  Subjective:  Brandi Andrews is a 28 y.o. G2P0010 at [redacted]w[redacted]d being seen today for ongoing prenatal care.  She is currently monitored for the following issues for this high-risk pregnancy and has BMI 40.0-44.9, adult ([place]); Cervical dysplasia; Noncompliance; Supervision of high risk pregnancy, antepartum; Subchorionic hematoma in second trimester; Sickle cell trait ([place]); UTI in pregnancy, antepartum; and Gestational diabetes on their problem list.  Patient reports no complaints.  Contractions: Irritability. Vag. Bleeding: None.  Movement: Present. Denies leaking of fluid.   The following portions of the patient's history were reviewed and updated as appropriate: allergies, current medications, past family history, past medical history, past social history, past surgical history and problem list.   Objective:   Vitals:   09/09/19 1029  BP: 111/62  Pulse: 86  Weight: 265 lb 14.4 oz (120.6 kg)    Fetal Status: Fetal Heart Rate (bpm): 136 Fundal Height: 33 cm Movement: Present  Presentation: Vertex  General:  Alert, oriented and cooperative. Patient is in no acute distress.  Skin: Skin is warm and dry. No rash noted.   Cardiovascular: Normal heart rate noted  Respiratory: Normal respiratory effort, no problems with respiration noted  Abdomen: Soft, gravid, appropriate for gestational age.  Pain/Pressure: Present     Pelvic: Cervical exam performed Dilation: 1 Effacement (%): 20 Station: Ballotable  Extremities: Normal range of motion.  Edema: None  Mental Status: Normal mood and affect. Normal behavior. Normal judgment and thought content.   Assessment and Plan:  Pregnancy: G2P0010 at [redacted]w[redacted]d 1. Supervision of high risk pregnancy, antepartum Cultures today - Culture, beta strep (group b only) - GC/Chlamydia probe amp ([place])not at [facility]  2. Gestational diabetes mellitus (GDM) controlled on oral hypoglycemic drug, antepartum CBGs are well controlled, see  [employer] numbers. She is on metformin, once daily but is apprehensive about the risks to the baby due to absent kidney and transfer to the placenta. Offered insulin, she would like to research prior to saying yes. Would consider HS [facility] only. In testing with MFM  Preterm labor symptoms and general obstetric precautions including but not limited to vaginal bleeding, contractions, leaking of fluid and fetal movement were reviewed in detail with the patient. Please refer to After Visit Summary for other counseling recommendations.   Return in about 1 week (around 09/16/2019) for [facility], virtual.  Future Appointments  Date Time Provider [place]  09/10/2019  3:45 PM [place] [facility]  09/10/2019  3:45 PM [place] [facility]  09/16/2019  9:15 AM Donnamae Jude, MD [facility]  09/22/2019  9:15 AM Donnamae Jude, MD [facility]  10/28/2019  8:30 AM Billie Ruddy, MD [facility]    Donnamae Jude, MD

## 2019-09-10 ENCOUNTER — Ambulatory Visit (HOSPITAL_COMMUNITY): Payer: Commercial Managed Care - PPO | Admitting: *Deleted

## 2019-09-10 ENCOUNTER — Ambulatory Visit (HOSPITAL_COMMUNITY): Payer: Commercial Managed Care - PPO

## 2019-09-10 ENCOUNTER — Ambulatory Visit (HOSPITAL_COMMUNITY)
Admission: RE | Admit: 2019-09-10 | Discharge: 2019-09-10 | Disposition: A | Discharge status: Home / Self Care | Payer: Commercial Managed Care - PPO | Source: Ambulatory Visit | Attending: Obstetrics | Admitting: Obstetrics

## 2019-09-10 ENCOUNTER — Encounter (HOSPITAL_COMMUNITY): Payer: Self-pay

## 2019-09-10 DIAGNOSIS — O3413 Maternal care for benign tumor of corpus uteri, third trimester: Secondary | ICD-10-CM | POA: Diagnosis not present

## 2019-09-10 DIAGNOSIS — O24419 Gestational diabetes mellitus in pregnancy, unspecified control: Secondary | ICD-10-CM | POA: Diagnosis present

## 2019-09-10 DIAGNOSIS — O24415 Gestational diabetes mellitus in pregnancy, controlled by oral hypoglycemic drugs: Secondary | ICD-10-CM | POA: Diagnosis not present

## 2019-09-10 DIAGNOSIS — O0990 Supervision of high risk pregnancy, unspecified, unspecified trimester: Secondary | ICD-10-CM | POA: Diagnosis present

## 2019-09-10 DIAGNOSIS — O359XX0 Maternal care for (suspected) fetal abnormality and damage, unspecified, not applicable or unspecified: Secondary | ICD-10-CM

## 2019-09-10 DIAGNOSIS — Z3A36 36 weeks gestation of pregnancy: Secondary | ICD-10-CM

## 2019-09-10 DIAGNOSIS — Z362 Encounter for other antenatal screening follow-up: Secondary | ICD-10-CM | POA: Diagnosis not present

## 2019-09-10 DIAGNOSIS — O99213 Obesity complicating pregnancy, third trimester: Secondary | ICD-10-CM | POA: Diagnosis not present

## 2019-09-10 LAB — GC/CHLAMYDIA PROBE AMP (~~LOC~~) NOT AT ARMC
Chlamydia: NEGATIVE
Comment: NEGATIVE
Comment: NORMAL
Neisseria Gonorrhea: NEGATIVE

## 2019-09-13 ENCOUNTER — Other Ambulatory Visit (HOSPITAL_COMMUNITY): Payer: Self-pay | Admitting: *Deleted

## 2019-09-13 DIAGNOSIS — O24415 Gestational diabetes mellitus in pregnancy, controlled by oral hypoglycemic drugs: Secondary | ICD-10-CM

## 2019-09-13 LAB — CULTURE, BETA STREP (GROUP B ONLY): Strep Gp B Culture: NEGATIVE

## 2019-09-15 ENCOUNTER — Ambulatory Visit (HOSPITAL_COMMUNITY)
Admission: RE | Admit: 2019-09-15 | Discharge: 2019-09-15 | Disposition: A | Discharge status: Home / Self Care | Payer: Commercial Managed Care - PPO | Source: Ambulatory Visit | Attending: Obstetrics and Gynecology | Admitting: Obstetrics and Gynecology

## 2019-09-15 ENCOUNTER — Telehealth: Payer: Self-pay

## 2019-09-15 ENCOUNTER — Telehealth: Payer: Self-pay | Admitting: Family Medicine

## 2019-09-15 ENCOUNTER — Ambulatory Visit (HOSPITAL_COMMUNITY): Payer: Commercial Managed Care - PPO

## 2019-09-15 ENCOUNTER — Encounter (HOSPITAL_COMMUNITY): Payer: Self-pay

## 2019-09-15 NOTE — Telephone Encounter (Signed)
Attempted to contact patient about her appointment on 11/19 @ 9:15. Patient instructed that the appointment is a mychart visit. No answer- left voicemail instructing patient to download the mychart app if not already done so. Patient instructed a nurse will give her a call around her appointment time. Patient instructed to give the office a call with any concerns.

## 2019-09-15 NOTE — Telephone Encounter (Signed)
Pt left a VM on nurse line requesting a call for GBS results from visit on 09/09/19.  Pt called; VM left stating I was returning pt's phone call concerning results and that I will send a MyChart message with more details.

## 2019-09-16 ENCOUNTER — Other Ambulatory Visit: Payer: Self-pay

## 2019-09-16 ENCOUNTER — Telehealth (INDEPENDENT_AMBULATORY_CARE_PROVIDER_SITE_OTHER): Payer: Commercial Managed Care - PPO | Admitting: Family Medicine

## 2019-09-16 VITALS — BP 120/69 | HR 86

## 2019-09-16 DIAGNOSIS — Z3A37 37 weeks gestation of pregnancy: Secondary | ICD-10-CM

## 2019-09-16 DIAGNOSIS — O0990 Supervision of high risk pregnancy, unspecified, unspecified trimester: Secondary | ICD-10-CM

## 2019-09-16 DIAGNOSIS — O0993 Supervision of high risk pregnancy, unspecified, third trimester: Secondary | ICD-10-CM

## 2019-09-16 DIAGNOSIS — Z91199 Patient's noncompliance with other medical treatment and regimen due to unspecified reason: Secondary | ICD-10-CM

## 2019-09-16 DIAGNOSIS — Z9119 Patient's noncompliance with other medical treatment and regimen: Secondary | ICD-10-CM

## 2019-09-16 DIAGNOSIS — O24415 Gestational diabetes mellitus in pregnancy, controlled by oral hypoglycemic drugs: Secondary | ICD-10-CM

## 2019-09-16 NOTE — Progress Notes (Signed)
TELEHEALTH OBSTETRICS PRENATAL VIRTUAL VIDEO VISIT ENCOUNTER NOTE  Provider location: [facility] [place]   I connected with Harvie Bridge on 09/16/19 at  9:15 AM EST by MyChart Video Encounter at home and verified that I am speaking with the correct person using two identifiers.   I discussed the limitations, risks, security and privacy concerns of performing an evaluation and management service virtually and the availability of in person appointments. I also discussed with the patient that there may be a patient responsible charge related to this service. The patient expressed understanding and agreed to proceed. Subjective:  Brandi Andrews is a 28 y.o. G2P0010 at [redacted]w[redacted]d being seen today for ongoing prenatal care.  She is currently monitored for the following issues for this high-risk pregnancy and has BMI 40.0-44.9, adult ([place]); Cervical dysplasia; Noncompliance; Supervision of high risk pregnancy, antepartum; Subchorionic hematoma in second trimester; Sickle cell trait ([place]); UTI in pregnancy, antepartum; and Gestational diabetes on their problem list.  Patient reports no complaints.  Contractions: Not present. Vag. Bleeding: None.  Movement: Present. Denies any leaking of fluid.   The following portions of the patient's history were reviewed and updated as appropriate: allergies, current medications, past family history, past medical history, past social history, past surgical history and problem list.   Objective:   Vitals:   09/16/19 0934  BP: 120/69  Pulse: 86    Fetal Status:     Movement: Present     General:  Alert, oriented and cooperative. Patient is in no acute distress.  Respiratory: Normal respiratory effort, no problems with respiration noted  Mental Status: Normal mood and affect. Normal behavior. Normal judgment and thought content.  Rest of physical exam deferred due to type of encounter  Imaging: [place] Mfm Fetal Bpp Wo Non Stress  Result  Date: 09/11/2019 ----------------------------------------------------------------------  OBSTETRICS REPORT                         (Signed Final 09/11/2019 08:11 am) ---------------------------------------------------------------------- Patient Info  ID #:        [id number]                          D.O.B.:  Mar 21, 1991 (28 yrs)  Name:        Brandi Andrews               Visit Date: 09/10/2019 04:01 pm ---------------------------------------------------------------------- Performed By  Performed By:      Rodrigo Ran BS      Referred By:       Zeb RVT                                  [place]  Attending:         Johnell Comings MD         [facility] ---------------------------------------------------------------------- Orders   #  Description                           Code         Ordered By   1  [place] MFM OB FOLLOW UP                   76816.01     YU FANG   2  [place] MFM FETAL BPP WO NON               76819.01     YU FANG      STRESS  ----------------------------------------------------------------------   #  Order #                     Accession #                 Episode #   1  [id number]                   [id number]                  [id number]   2  [id number]                   [id number]                  [id number]  ---------------------------------------------------------------------- Indications   Gestational diabetes in pregnancy, controlled   O24.415   by oral hypoglycemic drugs (metformin)   Obesity complicating pregnancy, third trimester O99.213   Fetal abnormality - other known or suspected    O35.9XX0   (absent left kidney)   Uterine fibroids                                O34.10   [redacted] weeks gestation of pregnancy                 Z3A.36  ---------------------------------------------------------------------- Vital Signs                                                  Height:        5'7"  ---------------------------------------------------------------------- Fetal Evaluation  Num Of Fetuses:          1  Fetal Heart Rate(bpm):   162  Cardiac Activity:        Observed  Presentation:            Cephalic  Placenta:                Posterior  P. Cord Insertion:       Visualized  Amniotic Fluid  AFI FV:      Within normal limits  AFI Sum(cm)     %Tile       Largest Pocket(cm)  9.84            21          2.81  RUQ(cm)       RLQ(cm)        LUQ(cm)        LLQ(cm)  2.39          2.32           2.81  2.32 ---------------------------------------------------------------------- Biophysical Evaluation  Amniotic F.V:   Within normal limits        F. Tone:         Observed  F. Movement:    Observed                    Score:           8/8  F. Breathing:   Observed ---------------------------------------------------------------------- Biometry  BPD:      92.7   mm     G. Age:  37w 5d         90  %    CI:         73.08  %    70 - 86                                                           FL/HC:       19.8  %    20.1 - 22.1  HC:      344.7   mm     G. Age:  39w 6d         94  %    HC/AC:       1.01       0.93 - 1.11  AC:      341.8   mm     G. Age:  38w 1d         95  %    FL/BPD:      73.5  %    71 - 87  FL:       68.1   mm     G. Age:  35w 0d         16  %    FL/AC:       19.9  %    20 - 24  HUM:        60   mm     G. Age:  34w 6d         38  %  Est. FW:    3230   gm      7 lb 2 oz     83  % ---------------------------------------------------------------------- OB History  Gravidity:     2         Term:  0          Prem:  0        SAB:   1  TOP:           0       Ectopic: 0         Living: 0 ---------------------------------------------------------------------- Gestational Age  LMP:            36w 2d       Date:  12/30/18                   EDD:  10/06/19  U/S Today:      37w 5d                                         EDD:  09/26/19  Best:  36w 2d    Det. By:  LMP  (12/30/18)            EDD:   10/06/19 ---------------------------------------------------------------------- Anatomy  Cranium:                Appears normal         Aortic Arch:            Previously seen  Cavum:                  Appears normal         Ductal Arch:            Previously seen  Ventricles:             Appears normal         Diaphragm:              Previously seen  Choroid Plexus:         Appears normal         Stomach:                Appears normal, left                                                                         sided  Cerebellum:             Previously seen        Abdomen:                Previously seen  Posterior Fossa:        Previously seen        Abdominal Wall:         Previously seen  Nuchal Fold:            Previously seen        Cord Vessels:           Previously seen  Face:                   Profile nl; orbits prevKidneys:                Absent left kidney                          visualized  Lips:                   Previously seen        Bladder:                Appears normal  Thoracic:               Appears normal         Spine:                  Previously seen  Heart:                  Appears normal         Upper Extremities:      Previously seen                          (  4CH, axis, and situs)  RVOT:                   Previously seen        Lower Extremities:      Previously seen  LVOT:                   Previously seen  Other:   Technically difficult due to maternal habitus and fetal position. ---------------------------------------------------------------------- Cervix Uterus Adnexa  Cervix  Not visualized (advanced GA >24wks) ---------------------------------------------------------------------- Comments  This patient was seen for a follow up growth scan due to  A2GDM.  The fetal growth and amniotic fluid level appears appropriate for  her gestational age.  A biophysical profile performed today was 8 out of 8.  A follow up exam was scheduled in one week.  ----------------------------------------------------------------------                    Johnell Comings, MD Electronically Signed Final Report   09/11/2019 08:11 am ----------------------------------------------------------------------  [place] Mfm Fetal Bpp Wo Non Stress  Result Date: 09/02/2019 ----------------------------------------------------------------------  OBSTETRICS REPORT                       (Signed Final 09/02/2019 03:18 pm) ---------------------------------------------------------------------- Patient Info  ID #:       [id number]                          D.O.B.:  1990/12/22 (28 yrs)  Name:       RENETA QIAO               Visit Date: 09/02/2019 01:53 pm ---------------------------------------------------------------------- Performed By  Performed By:     Jeanene Erb BS,      Secondary Phy.:   MAU Nursing-                    RDMS                                                             MAU/Triage  Attending:        Tama High MD        [facility]  Referred By:      Darlina Rumpf ---------------------------------------------------------------------- Orders   #  Description                          Code         Ordered By   1  [place] MFM FETAL BPP WO NON              [id number]     YU FANG      STRESS  ----------------------------------------------------------------------   #  Order #                    Accession #                 Episode #   1  [id number]                  [id number]  ---------------------------------------------------------------------- Indications   Gestational diabetes in pregnancy,             O24.415   controlled by oral hypoglycemic drugs   (metformin)   Obesity complicating pregnancy, second         O99.212   trimester (BMI 37)   Fetal abnormality - other known or             O35.9XX0   suspected (absent left kidney)   [redacted] weeks  gestation of pregnancy                Z3A.35   Uterine fibroids                               O34.10  ---------------------------------------------------------------------- Vital Signs                                                 Height:        5'7" ---------------------------------------------------------------------- Fetal Evaluation  Num Of Fetuses:         1  Fetal Heart Rate(bpm):  148  Cardiac Activity:       Observed  Presentation:           Cephalic  Placenta:               Posterior  P. Cord Insertion:      Previously Visualized  Amniotic Fluid  AFI FV:      Within normal limits  AFI Sum(cm)     %Tile       Largest Pocket(cm)  13.08           43          6.15  RUQ(cm)       RLQ(cm)       LUQ(cm)        LLQ(cm)  6.15          1.13          2.91           2.89 ---------------------------------------------------------------------- Biophysical Evaluation  Amniotic F.V:   Pocket => 2 cm             F. Tone:        Observed  F. Movement:    Observed                   Score:          8/8  F. Breathing:   Observed ---------------------------------------------------------------------- OB History  Gravidity:    2         Term:   0        Prem:   0        SAB:   1  TOP:          0  Ectopic:  0        Living: 0 ---------------------------------------------------------------------- Gestational Age  LMP:           35w 1d        Date:  12/30/18                 EDD:   10/06/19  Best:          35w 1d     Det. By:  LMP  (12/30/18)          EDD:   10/06/19 ---------------------------------------------------------------------- Impression  Gestational diabetes.  Patient reports her blood glucose  levels are within normal range and she takes oral  hypoglycemics.  On ultrasound, amniotic fluid is normal and good fetal activity  seen.  Antenatal testing is reassuring.  BPP 8/8.  Left renal  fossa is empty.  We suspect that there is a left pelvic kidney.  I informed the patient that ultrasound has limitations and only   postnatal evaluation will confirm our suspicion of left pelvic  kidney. ---------------------------------------------------------------------- Recommendations  -BPP and growth next week.  -Postnatal evaluation of kidneys of the newborn. ----------------------------------------------------------------------                  Tama High, MD Electronically Signed Final Report   09/02/2019 03:18 pm ----------------------------------------------------------------------  [place] Mfm Fetal Bpp Wo Non Stress  Result Date: 08/29/2019 ----------------------------------------------------------------------  OBSTETRICS REPORT                        (Signed Final 08/29/2019 08:05 am) ---------------------------------------------------------------------- Patient Info  ID #:       [id number]                          D.O.B.:  10-May-1991 (28 yrs)  Name:       BLESSYN ZWEIFEL               Visit Date: 08/27/2019 03:37 pm ---------------------------------------------------------------------- Performed By  Performed By:     Valda Favia          Referred By:       Irene Limbo  Attending:        Sander Nephew      [facility] ---------------------------------------------------------------------- Orders   #  Description                          Code         Ordered By   1  [place] MFM FETAL BPP WO NON              [id number]     YU FANG  STRESS  ----------------------------------------------------------------------   #  Order #                    Accession #                 Episode #   1  [id number]                  [id number]                  [id number]  ---------------------------------------------------------------------- Indications   [redacted] weeks gestation of pregnancy                [id number]   Obesity complicating pregnancy, second         O99.212   trimester (BMI 37)    Fetal abnormality - other known or             O35.9XX0   suspected (absent left kidney)   Uterine fibroids                               O34.10   Encounter for antenatal screening for          Z36.3   malformations   Gestational diabetes in pregnancy,             O24.415   controlled by oral hypoglycemic drugs   (metformin)  ---------------------------------------------------------------------- Vital Signs                                                 Height:        5'7" ---------------------------------------------------------------------- Fetal Evaluation  Num Of Fetuses:          1  Fetal Heart Rate(bpm):   143  Cardiac Activity:        Observed  Presentation:            Cephalic  Amniotic Fluid  AFI FV:      Within normal limits  AFI Sum(cm)     %Tile       Largest Pocket(cm)  15.48           56          4.94  RUQ(cm)       RLQ(cm)       LUQ(cm)        LLQ(cm)  4.94          3.48          4.27           2.79 ---------------------------------------------------------------------- Biophysical Evaluation  Amniotic F.V:   Within normal limits       F. Tone:         Observed  F. Movement:    Observed                   Score:           8/8  F. Breathing:   Observed ---------------------------------------------------------------------- OB History  Gravidity:    2         Term:   0        Prem:   0        SAB:   1  TOP:          0       Ectopic:  0        Living: 0 ---------------------------------------------------------------------- Gestational Age  LMP:           34w 2d        Date:  12/30/18                 EDD:   10/06/19  Best:          34w 2d     Det. 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  Follow up growth in 2 weeks. ----------------------------------------------------------------------               Sander Nephew, MD Electronically Signed Final Report   08/29/2019 08:05 am ----------------------------------------------------------------------  [place] Mfm Ob Follow Up  Result Date: 09/11/2019 ----------------------------------------------------------------------  OBSTETRICS REPORT                         (Signed Final 09/11/2019 08:11 am) ---------------------------------------------------------------------- Patient Info  ID #:        [id number]                          D.O.B.:  Jul 25, 1991 (28 yrs)  Name:        CLEONA WOLLMAN Duerksen               Visit Date: 09/10/2019 04:01 pm ---------------------------------------------------------------------- Performed By  Performed By:      Rodrigo Ran BS      Referred By:       [place] RVT                                  [place]  Attending:         Johnell Comings MD         [facility] ---------------------------------------------------------------------- Orders   #  Description                           Code         Ordered By   1  [place] MFM OB FOLLOW UP                   76816.01     YU FANG   2  [place] MFM FETAL BPP WO NON               76819.01     YU FANG      STRESS  ----------------------------------------------------------------------   #  Order #                     Accession #                 Episode #   1  [id number]                   [id number]                  [id number]   2  [id number]                   [id number]                  [id number]   ---------------------------------------------------------------------- Indications   Gestational diabetes in pregnancy, controlled   O24.415   by oral hypoglycemic drugs (metformin)   Obesity complicating pregnancy, third trimester O99.213   Fetal abnormality - other known or suspected    O35.9XX0   (absent left kidney)   Uterine fibroids                                O34.10   [redacted] weeks gestation of pregnancy                 Z3A.36  ---------------------------------------------------------------------- Vital Signs                                                  Height:        5'7" ---------------------------------------------------------------------- Fetal Evaluation  Num Of Fetuses:          1  Fetal Heart Rate(bpm):   162  Cardiac Activity:        Observed  Presentation:            Cephalic  Placenta:                Posterior  P. Cord Insertion:       Visualized  Amniotic Fluid  AFI FV:      Within normal limits  AFI Sum(cm)     %Tile       Largest Pocket(cm)  9.84            21          2.81  RUQ(cm)       RLQ(cm)        LUQ(cm)        LLQ(cm)  2.39          2.32           2.81  2.32 ---------------------------------------------------------------------- Biophysical Evaluation  Amniotic F.V:   Within normal limits        F. Tone:         Observed  F. Movement:    Observed                    Score:           8/8  F. Breathing:   Observed ---------------------------------------------------------------------- Biometry  BPD:      92.7   mm     G. Age:  37w 5d         90  %    CI:         73.08  %    70 - 86                                                           FL/HC:       19.8  %    20.1 - 22.1  HC:      344.7   mm     G. Age:  39w 6d         94  %    HC/AC:       1.01       0.93 - 1.11  AC:      341.8   mm     G. Age:  38w 1d         95  %    FL/BPD:      73.5  %    71 - 87  FL:       68.1   mm     G. Age:  35w 0d         16  %    FL/AC:       19.9  %    20 - 24  HUM:        60   mm     G. Age:  34w 6d          38  %  Est. FW:    3230   gm      7 lb 2 oz     83  % ---------------------------------------------------------------------- OB History  Gravidity:     2         Term:  0          Prem:  0        SAB:   1  TOP:           0       Ectopic: 0         Living: 0 ---------------------------------------------------------------------- Gestational Age  LMP:            36w 2d       Date:  12/30/18                   EDD:  10/06/19  U/S Today:      37w 5d                                         EDD:  09/26/19  Best:  36w 2d    Det. By:  LMP  (12/30/18)            EDD:  10/06/19 ---------------------------------------------------------------------- Anatomy  Cranium:                Appears normal         Aortic Arch:            Previously seen  Cavum:                  Appears normal         Ductal Arch:            Previously seen  Ventricles:             Appears normal         Diaphragm:              Previously seen  Choroid Plexus:         Appears normal         Stomach:                Appears normal, left                                                                         sided  Cerebellum:             Previously seen        Abdomen:                Previously seen  Posterior Fossa:        Previously seen        Abdominal Wall:         Previously seen  Nuchal Fold:            Previously seen        Cord Vessels:           Previously seen  Face:                   Profile nl; orbits prevKidneys:                Absent left kidney                          visualized  Lips:                   Previously seen        Bladder:                Appears normal  Thoracic:               Appears normal         Spine:                  Previously seen  Heart:                  Appears normal         Upper Extremities:      Previously seen                          (4CH,  axis, and situs)  RVOT:                   Previously seen        Lower Extremities:      Previously seen  LVOT:                   Previously seen  Other:    Technically difficult due to maternal habitus and fetal position. ---------------------------------------------------------------------- Cervix Uterus Adnexa  Cervix  Not visualized (advanced GA >24wks) ---------------------------------------------------------------------- Comments  This patient was seen for a follow up growth scan due to  A2GDM.  The fetal growth and amniotic fluid level appears appropriate for  her gestational age.  A biophysical profile performed today was 8 out of 8.  A follow up exam was scheduled in one week. ----------------------------------------------------------------------                    Johnell Comings, MD Electronically Signed Final Report   09/11/2019 08:11 am ----------------------------------------------------------------------   Assessment and Plan:  Pregnancy: G2P0010 at [redacted]w[redacted]d 1. Gestational diabetes mellitus (GDM) in third trimester controlled on oral hypoglycemic drug On metformin, not checking as regularly as she should due to sharing supplies with her mother. Though what is available is normal. Fasting 94 this am, but ate in the night. EFW 82%  Does not want IOL, offered her at 78 6/7 provided testing (getting weekly with MFM) and blood sugars are checked regularly and are normal.  2. Noncompliance Advised risks of poor sugar control.  3. Supervision of high risk pregnancy, antepartum Continue prenatal care. GBS negative.  Preterm labor symptoms and general obstetric precautions including but not limited to vaginal bleeding, contractions, leaking of fluid and fetal movement were reviewed in detail with the patient. I discussed the assessment and treatment plan with the patient. The patient was provided an opportunity to ask questions and all were answered. The patient agreed with the plan and demonstrated an understanding of the instructions. The patient was advised to call back or seek an in-person office evaluation/go to [facility]  for any urgent or concerning symptoms. Please refer to After Visit Summary for other counseling recommendations.   I provided 9 minutes of face-to-face time during this encounter.  Return in 1 week (on 09/23/2019) for  chenage to virtual, [facility], IOL on 12/8.  Future Appointments  Date Time Provider [place]  09/21/2019  4:00 PM [place] 3 [facility]-MFCUS MFC-US  09/21/2019  4:10 PM [place] NURSE [place] MFC-US  09/22/2019  9:15 AM Donnamae Jude, MD [facility]OCA WOC  10/28/2019  8:30 AM Billie Ruddy, MD [facility] [place], MD [facility] [place]

## 2019-09-16 NOTE — Patient Instructions (Signed)

## 2019-09-16 NOTE — Progress Notes (Signed)
I connected with  Harvie Bridge on 09/16/19 at  9:15 AM EST by telephone and verified that I am speaking with the correct person using two identifiers.   I discussed the limitations, risks, security and privacy concerns of performing an evaluation and management service by telephone and the availability of in person appointments. I also discussed with the patient that there may be a patient responsible charge related to this service. The patient expressed understanding and agreed to proceed.  Verdell Carmine, RN 09/16/2019  9:33 AM

## 2019-09-20 ENCOUNTER — Telehealth (HOSPITAL_COMMUNITY): Payer: Self-pay | Admitting: *Deleted

## 2019-09-20 NOTE — Telephone Encounter (Signed)
Preadmission screen  

## 2019-09-21 ENCOUNTER — Other Ambulatory Visit: Payer: Self-pay

## 2019-09-21 ENCOUNTER — Ambulatory Visit (HOSPITAL_COMMUNITY): Payer: Commercial Managed Care - PPO | Admitting: *Deleted

## 2019-09-21 ENCOUNTER — Encounter (HOSPITAL_COMMUNITY): Payer: Self-pay | Admitting: *Deleted

## 2019-09-21 ENCOUNTER — Telehealth (HOSPITAL_COMMUNITY): Payer: Self-pay | Admitting: *Deleted

## 2019-09-21 ENCOUNTER — Ambulatory Visit (HOSPITAL_COMMUNITY)
Admission: RE | Admit: 2019-09-21 | Discharge: 2019-09-21 | Disposition: A | Discharge status: Home / Self Care | Payer: Commercial Managed Care - PPO | Source: Ambulatory Visit | Attending: Obstetrics and Gynecology | Admitting: Obstetrics and Gynecology

## 2019-09-21 ENCOUNTER — Other Ambulatory Visit (HOSPITAL_COMMUNITY): Payer: Self-pay | Admitting: Obstetrics and Gynecology

## 2019-09-21 DIAGNOSIS — O24419 Gestational diabetes mellitus in pregnancy, unspecified control: Secondary | ICD-10-CM | POA: Diagnosis present

## 2019-09-21 DIAGNOSIS — O99213 Obesity complicating pregnancy, third trimester: Secondary | ICD-10-CM

## 2019-09-21 DIAGNOSIS — O24415 Gestational diabetes mellitus in pregnancy, controlled by oral hypoglycemic drugs: Secondary | ICD-10-CM

## 2019-09-21 DIAGNOSIS — O359XX0 Maternal care for (suspected) fetal abnormality and damage, unspecified, not applicable or unspecified: Secondary | ICD-10-CM

## 2019-09-21 DIAGNOSIS — O0990 Supervision of high risk pregnancy, unspecified, unspecified trimester: Secondary | ICD-10-CM | POA: Diagnosis present

## 2019-09-21 DIAGNOSIS — O3413 Maternal care for benign tumor of corpus uteri, third trimester: Secondary | ICD-10-CM

## 2019-09-21 DIAGNOSIS — Z3A37 37 weeks gestation of pregnancy: Secondary | ICD-10-CM

## 2019-09-21 NOTE — Procedures (Signed)
WANNA PARYS Jun 28, 1991 [redacted]w[redacted]d  Fetus A Non-Stress Test Interpretation for 09/21/19  Indication: Unsatisfactory BPP  Fetal Heart Rate A Mode: External Baseline Rate (A): 135 bpm Variability: Moderate Accelerations: 15 x 15 Decelerations: None Multiple birth?: No  Uterine Activity Mode: Palpation, Toco Contraction Frequency (min): Irreg. Contraction Duration (sec): 10-50 Contraction Quality: Mild Resting Tone Palpated: Relaxed Resting Time: Adequate  Interpretation (Fetal Testing) Nonstress Test Interpretation: Reactive Comments: EFM tracing reviewed by Dr. Donalee Citrin

## 2019-09-21 NOTE — Telephone Encounter (Signed)
Preadmission screen  

## 2019-09-22 ENCOUNTER — Ambulatory Visit (INDEPENDENT_AMBULATORY_CARE_PROVIDER_SITE_OTHER): Payer: Commercial Managed Care - PPO | Admitting: Family Medicine

## 2019-09-22 ENCOUNTER — Other Ambulatory Visit (HOSPITAL_COMMUNITY): Payer: Self-pay | Admitting: *Deleted

## 2019-09-22 VITALS — BP 112/77 | HR 92 | Wt 265.0 lb

## 2019-09-22 DIAGNOSIS — O0990 Supervision of high risk pregnancy, unspecified, unspecified trimester: Secondary | ICD-10-CM

## 2019-09-22 DIAGNOSIS — O24410 Gestational diabetes mellitus in pregnancy, diet controlled: Secondary | ICD-10-CM

## 2019-09-22 DIAGNOSIS — O0993 Supervision of high risk pregnancy, unspecified, third trimester: Secondary | ICD-10-CM

## 2019-09-22 DIAGNOSIS — Z3A38 38 weeks gestation of pregnancy: Secondary | ICD-10-CM

## 2019-09-22 DIAGNOSIS — O24415 Gestational diabetes mellitus in pregnancy, controlled by oral hypoglycemic drugs: Secondary | ICD-10-CM

## 2019-09-22 NOTE — Patient Instructions (Signed)

## 2019-09-22 NOTE — Progress Notes (Signed)
    PRENATAL VISIT NOTE  Subjective:  CELISSE GRIBBEN is a 28 y.o. G2P0010 at [redacted]w[redacted]d being seen today for ongoing prenatal care.  She is currently monitored for the following issues for this high-risk pregnancy and has BMI 40.0-44.9, adult ([place]); Cervical dysplasia; Noncompliance; Supervision of high risk pregnancy, antepartum; Subchorionic hematoma in second trimester; Sickle cell trait ([place]); UTI in pregnancy, antepartum; and Gestational diabetes on their problem list.  Patient reports no complaints.  Contractions: Irritability. Vag. Bleeding: None.  Movement: Present. Denies leaking of fluid.   The following portions of the patient's history were reviewed and updated as appropriate: allergies, current medications, past family history, past medical history, past social history, past surgical history and problem list.   Objective:   Vitals:   09/22/19 0933  BP: 112/77  Pulse: 92  Weight: 265 lb (120.2 kg)    Fetal Status: Fetal Heart Rate (bpm): 160 Fundal Height: 37 cm Movement: Present  Presentation: Vertex  General:  Alert, oriented and cooperative. Patient is in no acute distress.  Skin: Skin is warm and dry. No rash noted.   Cardiovascular: Normal heart rate noted  Respiratory: Normal respiratory effort, no problems with respiration noted  Abdomen: Soft, gravid, appropriate for gestational age.  Pain/Pressure: Absent     Pelvic: Cervical exam deferred        Extremities: Normal range of motion.  Edema: None  Mental Status: Normal mood and affect. Normal behavior. Normal judgment and thought content.   Assessment and Plan:  Pregnancy: G2P0010 at [redacted]w[redacted]d 1. Supervision of high risk pregnancy, antepartum   2. Gestational diabetes mellitus (GDM) in third trimester controlled on oral hypoglycemic drug See BabyScripts, CBGs ok BPP with MFM yesterday IOL scheduledat 39 6/7 per her desire and nml CBGs right now.  Term labor symptoms and general obstetric precautions including  but not limited to vaginal bleeding, contractions, leaking of fluid and fetal movement were reviewed in detail with the patient. Please refer to After Visit Summary for other counseling recommendations.   Return in about 1 week (around 09/29/2019) for in person, [facility].  Future Appointments  Date Time Provider [place]  09/28/2019 11:15 AM [place] MFC-US  09/28/2019 11:15 AM WH-MFC [place] 5 WH-MFCUS MFC-US  09/30/2019  2:35 PM Anyanwu, Sallyanne Havers, MD WOC-WOCA WOC  10/02/2019  9:25 AM MC-SCREENING MC-SDSC None  10/05/2019  7:00 AM MC-LD [place] None  10/28/2019  8:30 AM Billie Ruddy, MD LBPC-BF PEC    Donnamae Jude, MD

## 2019-09-26 ENCOUNTER — Other Ambulatory Visit: Payer: Self-pay

## 2019-09-28 ENCOUNTER — Encounter (HOSPITAL_COMMUNITY): Payer: Self-pay

## 2019-09-28 ENCOUNTER — Other Ambulatory Visit: Payer: Self-pay

## 2019-09-28 ENCOUNTER — Ambulatory Visit (HOSPITAL_COMMUNITY): Payer: Commercial Managed Care - PPO | Admitting: *Deleted

## 2019-09-28 ENCOUNTER — Ambulatory Visit (HOSPITAL_COMMUNITY)
Admission: RE | Admit: 2019-09-28 | Discharge: 2019-09-28 | Disposition: A | Discharge status: Home / Self Care | Payer: Commercial Managed Care - PPO | Source: Ambulatory Visit | Attending: Obstetrics and Gynecology | Admitting: Obstetrics and Gynecology

## 2019-09-28 DIAGNOSIS — O3413 Maternal care for benign tumor of corpus uteri, third trimester: Secondary | ICD-10-CM

## 2019-09-28 DIAGNOSIS — O359XX0 Maternal care for (suspected) fetal abnormality and damage, unspecified, not applicable or unspecified: Secondary | ICD-10-CM

## 2019-09-28 DIAGNOSIS — O0990 Supervision of high risk pregnancy, unspecified, unspecified trimester: Secondary | ICD-10-CM

## 2019-09-28 DIAGNOSIS — O24415 Gestational diabetes mellitus in pregnancy, controlled by oral hypoglycemic drugs: Secondary | ICD-10-CM

## 2019-09-28 DIAGNOSIS — O99213 Obesity complicating pregnancy, third trimester: Secondary | ICD-10-CM | POA: Diagnosis not present

## 2019-09-28 DIAGNOSIS — O24419 Gestational diabetes mellitus in pregnancy, unspecified control: Secondary | ICD-10-CM

## 2019-09-28 DIAGNOSIS — Z3A38 38 weeks gestation of pregnancy: Secondary | ICD-10-CM

## 2019-09-30 ENCOUNTER — Encounter: Payer: Commercial Managed Care - PPO | Admitting: Obstetrics and Gynecology

## 2019-09-30 ENCOUNTER — Ambulatory Visit (INDEPENDENT_AMBULATORY_CARE_PROVIDER_SITE_OTHER): Payer: Commercial Managed Care - PPO | Admitting: Obstetrics & Gynecology

## 2019-09-30 ENCOUNTER — Other Ambulatory Visit: Payer: Self-pay

## 2019-09-30 VITALS — BP 119/81 | HR 90 | Wt 265.0 lb

## 2019-09-30 DIAGNOSIS — O0990 Supervision of high risk pregnancy, unspecified, unspecified trimester: Secondary | ICD-10-CM

## 2019-09-30 DIAGNOSIS — Z3A39 39 weeks gestation of pregnancy: Secondary | ICD-10-CM

## 2019-09-30 DIAGNOSIS — O0993 Supervision of high risk pregnancy, unspecified, third trimester: Secondary | ICD-10-CM

## 2019-09-30 DIAGNOSIS — O24415 Gestational diabetes mellitus in pregnancy, controlled by oral hypoglycemic drugs: Secondary | ICD-10-CM

## 2019-09-30 NOTE — Patient Instructions (Signed)
Return to office for any scheduled appointments. Call the office or go to the MAU at [facility] if:  You begin to have strong, frequent contractions  Your water breaks.  Sometimes it is a big gush of fluid, sometimes it is just a trickle that keeps getting your panties wet or running down your legs  You have vaginal bleeding.  It is normal to have a small amount of spotting if your cervix was checked.   You do not feel your baby moving like normal.  If you do not, get something to eat and drink and lay down and focus on feeling your baby move.   If your baby is still not moving like normal, you should call the office or go to MAU.  Any other obstetric concerns.   

## 2019-09-30 NOTE — Progress Notes (Signed)
   PRENATAL VISIT NOTE  Subjective:  Brandi Andrews is a 28 y.o. G2P0010 at [redacted]w[redacted]d being seen today for ongoing prenatal care.  She is currently monitored for the following issues for this high-risk pregnancy and has BMI 40.0-44.9, adult ([place]); Cervical dysplasia; Noncompliance; Supervision of high risk pregnancy, antepartum; Subchorionic hematoma in second trimester; Sickle cell trait ([place]); UTI in pregnancy, antepartum; and Gestational diabetes on their problem list.  Patient reports occasional contractions.  Contractions: Irritability. Vag. Bleeding: None.  Movement: Present. Denies leaking of fluid.   The following portions of the patient's history were reviewed and updated as appropriate: allergies, current medications, past family history, past medical history, past social history, past surgical history and problem list.   Objective:   Vitals:   09/30/19 1448  BP: 119/81  Pulse: 90  Weight: 265 lb (120.2 kg)    Fetal Status: Fetal Heart Rate (bpm): 157   Movement: Present  Presentation: Vertex  General:  Alert, oriented and cooperative. Patient is in no acute distress.  Skin: Skin is warm and dry. No rash noted.   Cardiovascular: Normal heart rate noted  Respiratory: Normal respiratory effort, no problems with respiration noted  Abdomen: Soft, gravid, appropriate for gestational age.  Pain/Pressure: Present     Pelvic: Cervical exam performed Dilation: 1 Effacement (%): Thick Station: -3  Extremities: Normal range of motion.  Edema: None  Mental Status: Normal mood and affect. Normal behavior. Normal judgment and thought content.   Assessment and Plan:  Pregnancy: G2P0010 at [redacted]w[redacted]d 1. Gestational diabetes mellitus (GDM) controlled on oral hypoglycemic drug, antepartum Does not check CBGs frequently, few values are normal.  Continue Metformin. Already scheduled for IOL next week. Desires foley bulb placement outpatient, this will be scheduled the day prior to IOL.  2.  Supervision of high risk pregnancy, antepartum Term labor symptoms and general obstetric precautions including but not limited to vaginal bleeding, contractions, leaking of fluid and fetal movement were reviewed in detail with the patient. Please refer to After Visit Summary for other counseling recommendations.   Return in about 4 days (around 10/04/2019) for Foley bulb placement, NST.  Future Appointments  Date Time Provider [place]  10/02/2019  9:25 AM MC-SCREENING MC-SDSC None  10/05/2019  7:00 AM MC-LD [place] MC-INDC None  10/28/2019  8:30 AM Billie Ruddy, MD LBPC-BF PEC    Verita Schneiders, MD

## 2019-10-02 ENCOUNTER — Other Ambulatory Visit (HOSPITAL_COMMUNITY)
Admission: RE | Admit: 2019-10-02 | Discharge: 2019-10-02 | Disposition: A | Discharge status: Home / Self Care | Payer: Commercial Managed Care - PPO | Source: Ambulatory Visit | Attending: Family Medicine | Admitting: Family Medicine

## 2019-10-02 DIAGNOSIS — Z20828 Contact with and (suspected) exposure to other viral communicable diseases: Secondary | ICD-10-CM | POA: Insufficient documentation

## 2019-10-02 DIAGNOSIS — Z01812 Encounter for preprocedural laboratory examination: Secondary | ICD-10-CM | POA: Insufficient documentation

## 2019-10-02 LAB — SARS CORONAVIRUS 2 (TAT 6-24 HRS): SARS Coronavirus 2: NEGATIVE

## 2019-10-04 ENCOUNTER — Ambulatory Visit (INDEPENDENT_AMBULATORY_CARE_PROVIDER_SITE_OTHER): Payer: Commercial Managed Care - PPO | Admitting: Family Medicine

## 2019-10-04 ENCOUNTER — Ambulatory Visit (INDEPENDENT_AMBULATORY_CARE_PROVIDER_SITE_OTHER): Payer: Commercial Managed Care - PPO | Admitting: *Deleted

## 2019-10-04 ENCOUNTER — Other Ambulatory Visit: Payer: Self-pay

## 2019-10-04 VITALS — BP 137/70 | HR 87 | Wt 268.5 lb

## 2019-10-04 DIAGNOSIS — O24415 Gestational diabetes mellitus in pregnancy, controlled by oral hypoglycemic drugs: Secondary | ICD-10-CM

## 2019-10-04 DIAGNOSIS — O0990 Supervision of high risk pregnancy, unspecified, unspecified trimester: Secondary | ICD-10-CM

## 2019-10-04 DIAGNOSIS — O0993 Supervision of high risk pregnancy, unspecified, third trimester: Secondary | ICD-10-CM

## 2019-10-04 DIAGNOSIS — Z3A39 39 weeks gestation of pregnancy: Secondary | ICD-10-CM | POA: Diagnosis not present

## 2019-10-04 NOTE — Progress Notes (Signed)
Pt states she has not checked her blood sugar in 2-3 days.

## 2019-10-04 NOTE — Progress Notes (Addendum)
   PRENATAL VISIT NOTE  Subjective:  Brandi Andrews is a 28 y.o. G2P0010 at [redacted]w[redacted]d being seen today for ongoing prenatal care.  She is currently monitored for the following issues for this high-risk pregnancy and has BMI 40.0-44.9, adult ([place]); Cervical dysplasia; Noncompliance; Supervision of high risk pregnancy, antepartum; Subchorionic hematoma in second trimester; Sickle cell trait ([place]); UTI in pregnancy, antepartum; and Gestational diabetes on their problem list.  Patient reports no complaints.  Contractions: Irregular. Vag. Bleeding: None.  Movement: Present. Denies leaking of fluid.   The following portions of the patient's history were reviewed and updated as appropriate: allergies, current medications, past family history, past medical history, past social history, past surgical history and problem list.   Objective:   Vitals:   10/04/19 1339 10/04/19 1410  BP: 134/69 137/70  Pulse: 87   Weight: 268 lb 8 oz (121.8 kg)     Fetal Status: Fetal Heart Rate (bpm): NST   Movement: Present  Presentation: Vertex  General:  Alert, oriented and cooperative. Patient is in no acute distress.  Skin: Skin is warm and dry. No rash noted.   Cardiovascular: Normal heart rate noted  Respiratory: Normal respiratory effort, no problems with respiration noted  Abdomen: Soft, gravid, appropriate for gestational age.  Pain/Pressure: Present     Pelvic: Cervical exam performed Dilation: 1.5 Effacement (%): 50 Station: -3  Extremities: Normal range of motion.  Edema: None  Mental Status: Normal mood and affect. Normal behavior. Normal judgment and thought content.   Assessment and Plan:  Pregnancy: G2P0010 at [redacted]w[redacted]d Brandi Andrews was seen today for routine prenatal visit.  Diagnoses and all orders for this visit:  Supervision of high risk pregnancy, antepartum - IOL already scheduled for tomorrow AM - GBS neg - Foley bulb placed with speculum; reactive NST before and after  - Labor precautions  given   Gestational diabetes mellitus (GDM) in third trimester controlled on oral hypoglycemic drug - Not checking sugars frequently; reports she is mainly watching diet - Taking Metformin 500 mg qhs; not BID - EFW 83% on 11/13   Preterm labor symptoms and general obstetric precautions including but not limited to vaginal bleeding, contractions, leaking of fluid and fetal movement were reviewed in detail with the patient. Please refer to After Visit Summary for other counseling recommendations.   No follow-ups on file.  Future Appointments  Date Time Provider [place]  10/04/2019  3:15 PM Chauncey Mann, MD [place]  10/05/2019  7:00 AM MC-LD [place] None  10/28/2019  8:30 AM Billie Ruddy, MD LBPC-[facility] PEC    Chauncey Mann, MD

## 2019-10-05 ENCOUNTER — Encounter (HOSPITAL_COMMUNITY): Payer: Self-pay

## 2019-10-05 ENCOUNTER — Inpatient Hospital Stay (HOSPITAL_COMMUNITY): Payer: Commercial Managed Care - PPO

## 2019-10-05 ENCOUNTER — Inpatient Hospital Stay (HOSPITAL_COMMUNITY)
Admission: AD | Admit: 2019-10-05 | Discharge: 2019-10-08 | DRG: 768 | Disposition: A | Discharge status: Home / Self Care | Payer: Commercial Managed Care - PPO | Attending: Family Medicine | Admitting: Family Medicine

## 2019-10-05 DIAGNOSIS — Z87891 Personal history of nicotine dependence: Secondary | ICD-10-CM

## 2019-10-05 DIAGNOSIS — Z880 Allergy status to penicillin: Secondary | ICD-10-CM | POA: Diagnosis not present

## 2019-10-05 DIAGNOSIS — O26813 Pregnancy related exhaustion and fatigue, third trimester: Secondary | ICD-10-CM | POA: Diagnosis present

## 2019-10-05 DIAGNOSIS — D649 Anemia, unspecified: Secondary | ICD-10-CM | POA: Diagnosis present

## 2019-10-05 DIAGNOSIS — O99214 Obesity complicating childbirth: Secondary | ICD-10-CM | POA: Diagnosis present

## 2019-10-05 DIAGNOSIS — O24425 Gestational diabetes mellitus in childbirth, controlled by oral hypoglycemic drugs: Secondary | ICD-10-CM | POA: Diagnosis present

## 2019-10-05 DIAGNOSIS — Z3A40 40 weeks gestation of pregnancy: Secondary | ICD-10-CM

## 2019-10-05 DIAGNOSIS — Z3A39 39 weeks gestation of pregnancy: Secondary | ICD-10-CM

## 2019-10-05 DIAGNOSIS — O9902 Anemia complicating childbirth: Secondary | ICD-10-CM | POA: Diagnosis present

## 2019-10-05 DIAGNOSIS — Z6841 Body Mass Index (BMI) 40.0 and over, adult: Secondary | ICD-10-CM

## 2019-10-05 DIAGNOSIS — D573 Sickle-cell trait: Secondary | ICD-10-CM | POA: Diagnosis present

## 2019-10-05 DIAGNOSIS — Z20828 Contact with and (suspected) exposure to other viral communicable diseases: Secondary | ICD-10-CM | POA: Diagnosis present

## 2019-10-05 DIAGNOSIS — O24424 Gestational diabetes mellitus in childbirth, insulin controlled: Secondary | ICD-10-CM | POA: Diagnosis not present

## 2019-10-05 DIAGNOSIS — E669 Obesity, unspecified: Secondary | ICD-10-CM

## 2019-10-05 DIAGNOSIS — O24419 Gestational diabetes mellitus in pregnancy, unspecified control: Secondary | ICD-10-CM | POA: Diagnosis present

## 2019-10-05 LAB — CBC
HCT: 33.3 % — ABNORMAL LOW (ref 36.0–46.0)
Hemoglobin: 11.5 g/dL — ABNORMAL LOW (ref 12.0–15.0)
MCH: 28.9 pg (ref 26.0–34.0)
MCHC: 34.5 g/dL (ref 30.0–36.0)
MCV: 83.7 fL (ref 80.0–100.0)
Platelets: 271 10*3/uL (ref 150–400)
RBC: 3.98 MIL/uL (ref 3.87–5.11)
RDW: 13.6 % (ref 11.5–15.5)
WBC: 11 10*3/uL — ABNORMAL HIGH (ref 4.0–10.5)
nRBC: 0 % (ref 0.0–0.2)

## 2019-10-05 LAB — ABO/RH: ABO/RH(D): O POS

## 2019-10-05 LAB — GLUCOSE, CAPILLARY
Glucose-Capillary: 130 mg/dL — ABNORMAL HIGH (ref 70–99)
Glucose-Capillary: 80 mg/dL (ref 70–99)
Glucose-Capillary: 80 mg/dL (ref 70–99)
Glucose-Capillary: 104 mg/dL — ABNORMAL HIGH (ref 70–99)
Glucose-Capillary: 94 mg/dL (ref 70–99)

## 2019-10-05 LAB — RPR: RPR Ser Ql: NONREACTIVE

## 2019-10-05 LAB — TYPE AND SCREEN
ABO/RH(D): O POS
Antibody Screen: NEGATIVE

## 2019-10-05 MED ORDER — FENTANYL CITRATE (PF) 100 MCG/2ML IJ SOLN
100.0000 ug | INTRAMUSCULAR | Status: DC | PRN
  Administered 2019-10-05: 100 ug via INTRAVENOUS

## 2019-10-05 MED ORDER — TERBUTALINE SULFATE 1 MG/ML IJ SOLN: 0.2500 mg | Freq: Once | INTRAMUSCULAR | Status: DC | PRN

## 2019-10-05 MED ORDER — FLEET ENEMA 7-19 GM/118ML RE ENEM: 1.0000 | ENEMA | RECTAL | Status: DC | PRN

## 2019-10-05 MED ORDER — OXYTOCIN BOLUS FROM INFUSION
500.0000 mL | Freq: Once | INTRAVENOUS | Status: AC
  Administered 2019-10-06: 500 mL via INTRAVENOUS

## 2019-10-05 MED ORDER — OXYTOCIN 40 UNITS IN NORMAL SALINE INFUSION - SIMPLE MED: 2.5000 [IU]/h | INTRAVENOUS | Status: DC

## 2019-10-05 MED ORDER — MISOPROSTOL 50MCG HALF TABLET
50.0000 ug | ORAL_TABLET | ORAL | Status: DC | PRN
  Administered 2019-10-05 (×2): 50 ug via ORAL
  Filled 2019-10-05 (×3): qty 1

## 2019-10-05 MED ORDER — SOD CITRATE-CITRIC ACID 500-334 MG/5ML PO SOLN: 30.0000 mL | ORAL | Status: DC | PRN

## 2019-10-05 MED ORDER — FENTANYL CITRATE (PF) 100 MCG/2ML IJ SOLN: 100.0000 ug | INTRAMUSCULAR | Status: DC | PRN

## 2019-10-05 MED ORDER — OXYCODONE-ACETAMINOPHEN 5-325 MG PO TABS: 1.0000 | ORAL_TABLET | ORAL | Status: DC | PRN

## 2019-10-05 MED ORDER — LACTATED RINGERS IV SOLN
500.0000 mL | INTRAVENOUS | Status: DC | PRN
  Administered 2019-10-06: 500 mL via INTRAVENOUS

## 2019-10-05 MED ORDER — MISOPROSTOL 25 MCG QUARTER TABLET: 25.0000 ug | ORAL_TABLET | ORAL | Status: DC | PRN

## 2019-10-05 MED ORDER — FENTANYL CITRATE (PF) 100 MCG/2ML IJ SOLN
INTRAMUSCULAR | Status: AC
  Filled 2019-10-05: qty 2

## 2019-10-05 MED ORDER — LACTATED RINGERS IV SOLN
INTRAVENOUS | Status: DC
  Administered 2019-10-05 (×2): via INTRAVENOUS

## 2019-10-05 MED ORDER — OXYTOCIN 40 UNITS IN NORMAL SALINE INFUSION - SIMPLE MED
1.0000 m[IU]/min | INTRAVENOUS | Status: DC
  Administered 2019-10-05: 2 m[IU]/min via INTRAVENOUS
  Filled 2019-10-05: qty 1000

## 2019-10-05 MED ORDER — BUTORPHANOL TARTRATE 1 MG/ML IJ SOLN
0.5000 mg | INTRAMUSCULAR | Status: DC | PRN
  Administered 2019-10-05: 0.5 mg via INTRAVENOUS
  Filled 2019-10-05: qty 1

## 2019-10-05 MED ORDER — ONDANSETRON HCL 4 MG/2ML IJ SOLN
4.0000 mg | Freq: Four times a day (QID) | INTRAMUSCULAR | Status: DC | PRN
  Administered 2019-10-06: 4 mg via INTRAVENOUS
  Filled 2019-10-05: qty 2

## 2019-10-05 MED ORDER — PROMETHAZINE HCL 25 MG/ML IJ SOLN
12.5000 mg | INTRAMUSCULAR | Status: DC | PRN
  Administered 2019-10-05: 12.5 mg via INTRAVENOUS
  Filled 2019-10-05: qty 1

## 2019-10-05 MED ORDER — OXYCODONE-ACETAMINOPHEN 5-325 MG PO TABS: 2.0000 | ORAL_TABLET | ORAL | Status: DC | PRN

## 2019-10-05 MED ORDER — LIDOCAINE HCL (PF) 1 % IJ SOLN: 30.0000 mL | INTRAMUSCULAR | Status: DC | PRN

## 2019-10-05 MED ORDER — ACETAMINOPHEN 325 MG PO TABS
650.0000 mg | ORAL_TABLET | ORAL | Status: DC | PRN
  Administered 2019-10-06: 650 mg via ORAL
  Filled 2019-10-05: qty 2

## 2019-10-05 NOTE — Progress Notes (Signed)
Labor Progress Note Brandi Andrews is a 28 y.o. G2P0010 at [redacted]w[redacted]d presented for IOL due to A2gDM. S: She is feeling well. Contractions are q2-62min but do not feel very intense. Not requiring anything for pain control.  O:  BP 126/76   Pulse (!) 101   Temp 98 F (36.7 C) (Oral)   Resp 16   Ht 5\' 7"  (1.702 m)   Wt 121.1 kg   LMP 12/30/2018 (Exact Date)   BMI 41.82 kg/m  EFM: 145 bpm/mod var/+accels, no decels  CVE: Dilation: 5 Effacement (%): 50 Station: -2 Presentation: Vertex Exam by:: Dr. Lia Foyer   A&P: 28 y.o. G2P0010 [redacted]w[redacted]d IOL due to A2gDM.  #Labor: Progressing well. Still in latent labor. FB was placed 12/7 in the office, fell out around 5 this AM. S/p 1 dose Cytotec 57mcg. Will redose now. #Pain: She would like to have natural delivery. OK w/ IV pain medications but would like to avoid epidural. #FWB: Cat 1 FHT, continue to monitor #GBS negative #A2gDM: not taking metformin. Check BG q4h while in latent labor, q2h in active labor.  Demetrius Revel, MD 1:57 PM

## 2019-10-05 NOTE — Progress Notes (Signed)
Brandi Andrews is a 28 y.o. G2P0010 at [redacted]w[redacted]d admitted for IOL 2/2 A2gDM  Subjective: Having more pressure with contractions, requesting IV pain meds.  Objective: BP 126/84   Pulse 97   Temp 99 F (37.2 C) (Oral)   Resp 17   Ht 5\' 7"  (1.702 m)   Wt 121.1 kg   LMP 12/30/2018 (Exact Date)   BMI 41.82 kg/m  No intake/output data recorded.  FHT:  FHR: 140 bpm, variability: moderate,  accelerations:  Present,  decelerations:  Present variable UC:   regular, every 2-3 minutes  SVE:   Dilation: 6 Effacement (%): 70 Station: -2 Exam by:: Dr. Darene Lamer  Pitocin @ 6 mu/min  Labs: Lab Results  Component Value Date   WBC 11.0 (H) 10/05/2019   HGB 11.5 (L) 10/05/2019   HCT 33.3 (L) 10/05/2019   MCV 83.7 10/05/2019   PLT 271 10/05/2019    Assessment / Plan: 28 y.o.G2P0010 [redacted]w[redacted]d IOL due to A2gDM.  #Labor: S/p FB, cyto x2. SROM at 2020 hours and Pit at 5.Consider IUPC placement if next cervical exam the same. #Pain:IV pain meds, declines epidural #FWB:Cat II FHT, continue to monitor. Reassuring for moderate variability, presence of accels, and prompt return to baseline. #GBS:negative #A2gDM: not taking metformin. Most recent CBG 104. Most likely transitioning into active labor, so CBGs q2h.  Anticipate vaginal delivery, CS as appropriate  Merilyn Baba DO OB Fellow, [facility] 10/05/2019, 10:11 PM

## 2019-10-05 NOTE — H&P (Addendum)
OBSTETRIC ADMISSION HISTORY AND PHYSICAL  Brandi Andrews is a 28 y.o. female G2P0010 with IUP at 79w6dby LMP presenting for IOL due to A2gDM on metformin. She has been checking her blood glucose at home and has been controlling it w/ diet. She is not taking her metformin. She has foley bulb placed at her appt on 12/7 and it fell out around 4AM today. She reports +FMs, No LOF, no VB, no blurry vision, headaches or peripheral edema, and RUQ pain.  She plans on breast feeding. She request nothing for birth control. [facility]: By LMP --->  Estimated Date of Delivery: 10/06/19  Sono:    '@[redacted]w[redacted]d' , CWD, normal anatomy, cephalic presentation, 36440H 83% EFW  Prenatal History/Complications: 2nd trimester subchorionic hemorrhage A2gDM not taking metformin Sickle Cell Trait  Rubella Nonimmune  Past Medical History: Past Medical History:  Diagnosis Date  . Gestational diabetes    prescribed metformin but not taking it  . Medical history non-contributory   . Obesity   . Ovarian cyst     Past Surgical History: Past Surgical History:  Procedure Laterality Date  . EYE SURGERY    . LAPAROSCOPIC SALPINGO OOPHERECTOMY Right 03/03/2018   Procedure: LAPAROSCOPIC SALPINGO OOPHORECTOMY;  Surgeon: AOsborne Oman MD;  [facility];  Service: Gynecology;  Laterality: Right;    Obstetrical History: OB History    Gravida  2   Para      Term      Preterm      AB  1   Living  0     SAB  1   TAB      Ectopic      Multiple      Live Births  0        Obstetric Comments  G1: 2012 early SAB        Social History: Social History   Socioeconomic History  . Marital status: Single    Spouse name: Not on file  . Number of children: Not on file  . Years of education: Not on file  . Highest education level: Not on file  Occupational History  . Not on file  Social Needs  . Financial resource strain: Not on file  . Food insecurity     Worry: Never true    Inability: Never true  . Transportation needs    Medical: No    Non-medical: No  Tobacco Use  . Smoking status: Former Smoker    Packs/day: 0.10    Years: 4.00    Pack years: 0.40    Types: Cigarettes  . Smokeless tobacco: Never Used  Substance and Sexual Activity  . Alcohol use: Not Currently  . Drug use: No  . Sexual activity: Yes    Birth control/protection: None  Lifestyle  . Physical activity    Days per week: Not on file    Minutes per session: Not on file  . Stress: Not on file  Relationships  . Social [place]: Not on file    Gets together: Not on file    Attends religious service: Not on file    Active member of club or organization: Not on file    Attends meetings of clubs or organizations: Not on file    Relationship status: Not on file  Other Topics Concern  . Not on file  Social History Narrative  . Not on file    Family  History: Family History  Problem Relation Age of Onset  . Diabetes Mother   . Breast cancer Mother 4  . Hypertension Father     Allergies: Allergies  Allergen Reactions  . Mushroom Extract Complex Swelling and Other (See Comments)    Lips swell and patient gets dizzy  . Penicillins Other (See Comments)    Was told from childhood: Has patient had a PCN reaction causing immediate rash, facial/tongue/throat swelling, SOB or lightheadedness with hypotension: Unknown Has patient had a PCN reaction causing severe rash involving mucus membranes or skin necrosis: Unknown Has patient had a PCN reaction that required hospitalization: Unknown Has patient had a PCN reaction occurring within the last 10 years: No If all of the above answers are "NO", then may proceed with Cephalosporin use.     Medications Prior to Admission  Medication Sig Dispense Refill Last Dose  . Blood Glucose Monitoring Suppl (ACCU-CHEK GUIDE ME) w/Device KIT See admin instructions.     Marland Kitchen glucose blood test strip Test  Blood Glucose 4 x a day. Fasting and 2 hours after breakfast, lunch and dinner. (Patient not taking: Reported on 10/04/2019) 100 each 12   . Lancets (ONETOUCH ULTRASOFT) lancets Use as instructed (Patient not taking: Reported on 10/04/2019) 100 each 12   . metFORMIN (GLUCOPHAGE) 500 MG tablet Take 1 tablet (500 mg total) by mouth 2 (two) times daily with a meal. (Patient taking differently: Take 500 mg by mouth at bedtime. ) 90 tablet 2   . Prenatal Vit-Fe Fumarate-FA (PRENATAL PO) Take by mouth.        Review of Systems   All systems reviewed and negative except as stated in HPI  Last menstrual period 12/30/2018. General appearance: alert, cooperative, appears stated age and no distress Lungs: clear to auscultation bilaterally Heart: regular rate and rhythm Abdomen: soft, non-tender; bowel sounds normal Pelvic: 3-4/30/-2 Extremities: Homans sign is negative, no sign of DV Presentation: cephalic Fetal monitoringBaseline: 145 bpm, Variability: Good {> 6 bpm), Accelerations: Reactive and Decelerations: Absent Uterine activityNone     Prenatal labs: ABO, Rh: O/Positive/-- (05/28 1123) Antibody: Negative (05/28 1123) Rubella: 5.16 (05/28 1123) RPR: Non Reactive (09/16 0837)  HBsAg: Negative (05/28 1123)  HIV: Non Reactive (09/16 0837)  GBS: Negative/-- (11/12 1213)  2 hr Glucola failed- 86, 185, 125 Genetic screening  Low risk Anatomy US: normal, posterior placenta  Prenatal Transfer Tool  Maternal Diabetes: Yes:  Diabetes Type:  Insulin/Medication controlled Genetic Screening: Normal Maternal Ultrasounds/Referrals: Normal Fetal Ultrasounds or other Referrals:  Referred to Materal Fetal Medicine  Maternal Substance Abuse:  No Significant Maternal Medications:  None Significant Maternal Lab Results: Group B Strep negative  No results found for this or any previous visit (from the past 24 hour(s)).  Patient Active Problem List   Diagnosis Date Noted  . Gestational diabetes  07/19/2019  . Sickle cell trait ([place]) 05/05/2019  . UTI in pregnancy, antepartum 05/05/2019  . Subchorionic hematoma in second trimester 04/15/2019  . Supervision of high risk pregnancy, antepartum 03/15/2019  . Noncompliance 03/03/2018  . Cervical dysplasia 03/04/2017  . BMI 40.0-44.9, adult ([place]) 02/26/2017    Assessment/Plan:  STAR RESLER is a 28 y.o. G2P0010 at 33w6dhere for IOL due to A2gDM.  #Labor: FB was placed 12/7 in the office, fell out around 4 this AM. Cervix is dilated but still thick- will dose cytotec 511m BU.  #Pain: She would like to have natural delivery. OK w/ IV pain medications but would like to avoid  epidural. #FWB: Cat1 FHT, continue to monitor #ID:  GBS-, COVID-. #MOF: breast #MOC:condoms, refused other contraceptive options #Circ: desires circ #A2gDM: not taking metformin. Check BG q4h while in latent labor, q2h in active labor.  Demetrius Revel, MD  10/05/2019, 8:28 AM  I saw and evaluated the patient. I agree with the findings and the plan of care as documented in the resident's note. EFW 3600g. S/p FB that was placed in office yesterday and will start Cytotec.  Barrington Ellison, MD [facility] Family Medicine Fellow, [facility] for [employer], [place]

## 2019-10-05 NOTE — Progress Notes (Signed)
Brandi Andrews is a 28 y.o. G2P0010 at 40w6dadmitted for IOL 2/2 A2gDM  Subjective: Met patient. Breathing through contractions, reports SROM at 2020 hours.  Objective: BP 128/76   Pulse 95   Temp 99 F (37.2 C) (Oral)   Resp 17   Ht _0  (1.702 m)   Wt 121.1 kg   LMP 12/30/2018 (Exact Date)   BMI 41.82 kg/m  No intake/output data recorded.  FHT:  FHR: 135 bpm, variability: moderate,  accelerations:  Present,  decelerations:  Absent UC:   regular, every 2-3 minutes  SVE:   Dilation: 5 Effacement (%): 70 Station: -2, -1 Exam by:: Dr. ALia Andrews Pitocin @ 4 mu/min  Labs: Lab Results  Component Value Date   WBC 11.0 (H) 10/05/2019   HGB 11.5 (L) 10/05/2019   HCT 33.3 (L) 10/05/2019   MCV 83.7 10/05/2019   PLT 271 10/05/2019    Assessment / Plan: 28y.o. G2P0010 392w6dOL due to A2gDM.  #Labor: S/p FB, cyto x2. SROM at 2020 hours and Pit at 4.Plan to recheck at 2220 hours to assess cervical change. Consider IUPC placement at that time if cervical exam the same. #Pain:IV pain meds, declines epidural #FWB:Cat I FHT, continue to monitor #GBS:negative #A2gDM: not taking metformin. Most recent CBG 104. Most likely transitioning into active labor, so CBGs q2h.  Anticipate vaginal delivery, CS as appropriate  Brandi Andrews OB Fellow, Faculty [facility] 10/05/2019, 9:22 PM

## 2019-10-05 NOTE — Progress Notes (Signed)
Labor Progress Note Brandi Andrews is a 28 y.o. G2P0010 at [redacted]w[redacted]d presented for IOL due to A2gDM. S: She is tolerating early labor well. Walking laps around the unit and bouncing on the ball to help w/ cramping. Contractions are still mild.  O:  BP 133/75   Pulse 84   Temp 98.9 F (37.2 C) (Oral)   Resp 18   Ht 5\' 7"  (1.702 m)   Wt 121.1 kg   LMP 12/30/2018 (Exact Date)   BMI 41.82 kg/m  EFM: 150/mod var/+accels, no decels  CVE: Dilation: 5 Effacement (%): 50 Station: -2 Presentation: Vertex Exam by:: Dr. Lia Foyer   A&P: 28 y.o. G2P0010 [redacted]w[redacted]d IOL due to A2gDM.  #Labor: Progressing well. Still in latent labor. FB was placed 12/7 in the office, fell out around 5 this AM. S/p Cytotec 51mcg x2 doses, last was at 1422. Her cervix has thin out, will start pitocin now.  #Pain: She would like to have natural delivery. OK w/ IV pain medications but would like to avoid epidural. #FWB: Cat 1 FHT, continue to monitor #GBS negative #A2gDM: not taking metformin. Check BG q4h while in latent labor, q2h in active labor.  Demetrius Revel, MD 6:36 PM

## 2019-10-06 ENCOUNTER — Inpatient Hospital Stay (HOSPITAL_COMMUNITY): Payer: Commercial Managed Care - PPO | Admitting: Anesthesiology

## 2019-10-06 ENCOUNTER — Encounter (HOSPITAL_COMMUNITY): Payer: Self-pay | Admitting: *Deleted

## 2019-10-06 DIAGNOSIS — O24424 Gestational diabetes mellitus in childbirth, insulin controlled: Secondary | ICD-10-CM

## 2019-10-06 DIAGNOSIS — Z3A40 40 weeks gestation of pregnancy: Secondary | ICD-10-CM

## 2019-10-06 LAB — GLUCOSE, CAPILLARY
Glucose-Capillary: 115 mg/dL — ABNORMAL HIGH (ref 70–99)
Glucose-Capillary: 142 mg/dL — ABNORMAL HIGH (ref 70–99)
Glucose-Capillary: 140 mg/dL — ABNORMAL HIGH (ref 70–99)
Glucose-Capillary: 143 mg/dL — ABNORMAL HIGH (ref 70–99)

## 2019-10-06 MED ORDER — CLINDAMYCIN PHOSPHATE 900 MG/50ML IV SOLN
900.0000 mg | Freq: Three times a day (TID) | INTRAVENOUS | Status: DC
  Administered 2019-10-06: 900 mg via INTRAVENOUS
  Filled 2019-10-06 (×3): qty 50

## 2019-10-06 MED ORDER — METHYLERGONOVINE MALEATE 0.2 MG/ML IJ SOLN: 0.2000 mg | Freq: Once | INTRAMUSCULAR | Status: AC

## 2019-10-06 MED ORDER — COCONUT OIL OIL: 1.0000 "application " | TOPICAL_OIL | Status: DC | PRN

## 2019-10-06 MED ORDER — FENTANYL-BUPIVACAINE-NACL 0.5-0.125-0.9 MG/250ML-% EP SOLN: 12.0000 mL/h | EPIDURAL | Status: DC | PRN

## 2019-10-06 MED ORDER — FENTANYL-BUPIVACAINE-NACL 0.5-0.125-0.9 MG/250ML-% EP SOLN
EPIDURAL | Status: AC
  Filled 2019-10-06: qty 250

## 2019-10-06 MED ORDER — METHYLERGONOVINE MALEATE 0.2 MG/ML IJ SOLN
INTRAMUSCULAR | Status: AC
  Administered 2019-10-06: 0.2 mg via INTRAMUSCULAR
  Filled 2019-10-06: qty 1

## 2019-10-06 MED ORDER — DIPHENHYDRAMINE HCL 25 MG PO CAPS: 25.0000 mg | ORAL_CAPSULE | Freq: Four times a day (QID) | ORAL | Status: DC | PRN

## 2019-10-06 MED ORDER — MEASLES, MUMPS & RUBELLA VAC IJ SOLR: 0.5000 mL | Freq: Once | INTRAMUSCULAR | Status: DC

## 2019-10-06 MED ORDER — SODIUM CHLORIDE (PF) 0.9 % IJ SOLN
INTRAMUSCULAR | Status: DC | PRN
  Administered 2019-10-06: 12 mL/h via EPIDURAL

## 2019-10-06 MED ORDER — WITCH HAZEL-GLYCERIN EX PADS: 1.0000 "application " | MEDICATED_PAD | CUTANEOUS | Status: DC | PRN

## 2019-10-06 MED ORDER — ACETAMINOPHEN 325 MG PO TABS
650.0000 mg | ORAL_TABLET | Freq: Four times a day (QID) | ORAL | Status: DC | PRN
  Administered 2019-10-06 – 2019-10-08 (×5): 650 mg via ORAL
  Filled 2019-10-06 (×5): qty 2

## 2019-10-06 MED ORDER — TETANUS-DIPHTH-ACELL PERTUSSIS 5-2.5-18.5 LF-MCG/0.5 IM SUSP: 0.5000 mL | Freq: Once | INTRAMUSCULAR | Status: DC

## 2019-10-06 MED ORDER — GENTAMICIN SULFATE 40 MG/ML IJ SOLN
5.0000 mg/kg | INTRAVENOUS | Status: DC
  Administered 2019-10-06: 430 mg via INTRAVENOUS
  Filled 2019-10-06: qty 10.75

## 2019-10-06 MED ORDER — EPHEDRINE 5 MG/ML INJ
10.0000 mg | INTRAVENOUS | Status: DC | PRN
  Filled 2019-10-06: qty 2

## 2019-10-06 MED ORDER — DIBUCAINE (PERIANAL) 1 % EX OINT: 1.0000 "application " | TOPICAL_OINTMENT | CUTANEOUS | Status: DC | PRN

## 2019-10-06 MED ORDER — ONDANSETRON HCL 4 MG PO TABS: 4.0000 mg | ORAL_TABLET | ORAL | Status: DC | PRN

## 2019-10-06 MED ORDER — TRANEXAMIC ACID-NACL 1000-0.7 MG/100ML-% IV SOLN: 1000.0000 mg | INTRAVENOUS | Status: DC

## 2019-10-06 MED ORDER — SIMETHICONE 80 MG PO CHEW: 80.0000 mg | CHEWABLE_TABLET | ORAL | Status: DC | PRN

## 2019-10-06 MED ORDER — DIPHENHYDRAMINE HCL 50 MG/ML IJ SOLN: 12.5000 mg | INTRAMUSCULAR | Status: DC | PRN

## 2019-10-06 MED ORDER — PHENYLEPHRINE 40 MCG/ML (10ML) SYRINGE FOR IV PUSH (FOR BLOOD PRESSURE SUPPORT)
80.0000 ug | PREFILLED_SYRINGE | INTRAVENOUS | Status: DC | PRN
  Filled 2019-10-06: qty 10

## 2019-10-06 MED ORDER — LACTATED RINGERS IV BOLUS: 1000.0000 mL | Freq: Once | INTRAVENOUS | Status: DC

## 2019-10-06 MED ORDER — PRENATAL MULTIVITAMIN CH
1.0000 | ORAL_TABLET | Freq: Every day | ORAL | Status: DC
  Administered 2019-10-07: 1 via ORAL
  Filled 2019-10-06: qty 1

## 2019-10-06 MED ORDER — ONDANSETRON HCL 4 MG/2ML IJ SOLN: 4.0000 mg | INTRAMUSCULAR | Status: DC | PRN

## 2019-10-06 MED ORDER — LACTATED RINGERS IV SOLN: 500.0000 mL | Freq: Once | INTRAVENOUS | Status: DC

## 2019-10-06 MED ORDER — BENZOCAINE-MENTHOL 20-0.5 % EX AERO
1.0000 "application " | INHALATION_SPRAY | CUTANEOUS | Status: DC | PRN
  Administered 2019-10-06: 1 via TOPICAL
  Filled 2019-10-06: qty 56

## 2019-10-06 MED ORDER — LIDOCAINE HCL (PF) 1 % IJ SOLN
INTRAMUSCULAR | Status: DC | PRN
  Administered 2019-10-06: 7 mL via EPIDURAL
  Administered 2019-10-06: 6 mL via EPIDURAL

## 2019-10-06 MED ORDER — TRANEXAMIC ACID-NACL 1000-0.7 MG/100ML-% IV SOLN
INTRAVENOUS | Status: AC
  Administered 2019-10-06: 1000 mg via INTRAVENOUS
  Filled 2019-10-06: qty 100

## 2019-10-06 MED ORDER — IBUPROFEN 600 MG PO TABS
600.0000 mg | ORAL_TABLET | Freq: Three times a day (TID) | ORAL | Status: DC | PRN
  Administered 2019-10-06 (×2): 600 mg via ORAL
  Filled 2019-10-06 (×2): qty 1

## 2019-10-06 MED ORDER — LACTATED RINGERS IV SOLN: INTRAVENOUS | Status: DC

## 2019-10-06 MED ORDER — SENNOSIDES-DOCUSATE SODIUM 8.6-50 MG PO TABS
2.0000 | ORAL_TABLET | ORAL | Status: DC
  Administered 2019-10-06 – 2019-10-07 (×2): 2 via ORAL
  Filled 2019-10-06: qty 2

## 2019-10-06 NOTE — Progress Notes (Signed)
ANTIBIOTIC CONSULT NOTE - INITIAL  Pharmacy Consult for Gentamicin Indication: Chorioamnionitis   Allergies  Allergen Reactions  . Mushroom Extract Complex Swelling and Other (See Comments)    Lips swell and patient gets dizzy  . Penicillins Other (See Comments)    Was told from childhood: Has patient had a PCN reaction causing immediate rash, facial/tongue/throat swelling, SOB or lightheadedness with hypotension: Unknown Has patient had a PCN reaction causing severe rash involving mucus membranes or skin necrosis: Unknown Has patient had a PCN reaction that required hospitalization: Unknown Has patient had a PCN reaction occurring within the last 10 years: No If all of the above answers are "NO", then may proceed with Cephalosporin use.     Patient Measurements: Height: 5\' 7"  (170.2 cm) Weight: 267 lb (121.1 kg) IBW/kg (Calculated) : 61.6 Adjusted Body Weight: 85.4 kg  Vital Signs: Temp: 100.5 F (38.1 C) (12/09 0740) Temp Source: Axillary (12/09 0740) BP: 138/74 (12/09 0730) Pulse Rate: 100 (12/09 0730)  Labs: Recent Labs    10/05/19 0817  WBC 11.0*  HGB 11.5*  PLT 271   No results for input(s): GENTTROUGH, GENTPEAK, GENTRANDOM in the last 72 hours.   Microbiology: Recent Results (from the past 720 hour(s))  Culture, beta strep (group b only)     Status: None   Collection Time: 09/09/19 12:13 PM   Specimen: Vaginal/Rectal; Genital   VR  Result Value Ref Range Status   Strep Gp B Culture Negative Negative Final    Comment: Centers for Disease Control and Prevention (CDC) and American Congress of Obstetricians and Gynecologists (ACOG) guidelines for prevention of perinatal group B streptococcal (GBS) disease specify co-collection of a vaginal and rectal swab specimen to maximize sensitivity of GBS detection. Per the CDC and ACOG, swabbing both the lower vagina and rectum substantially increases the yield of detection compared with sampling the vagina  alone. Penicillin G, ampicillin, or cefazolin are indicated for intrapartum prophylaxis of perinatal GBS colonization. Reflex susceptibility testing should be performed prior to use of clindamycin only on GBS isolates from penicillin-allergic women who are considered a high risk for anaphylaxis. Treatment with vancomycin without additional testing is warranted if resistance to clindamycin is noted.   SARS CORONAVIRUS 2 (TAT 6-24 HRS) Nasopharyngeal Nasopharyngeal Swab     Status: None   Collection Time: 10/02/19 10:17 AM   Specimen: Nasopharyngeal Swab  Result Value Ref Range Status   SARS Coronavirus 2 NEGATIVE NEGATIVE Final    Comment: (NOTE) SARS-CoV-2 target nucleic acids are NOT DETECTED. The SARS-CoV-2 RNA is generally detectable in upper and lower respiratory specimens during the acute phase of infection. Negative results do not preclude SARS-CoV-2 infection, do not rule out co-infections with other pathogens, and should not be used as the sole basis for treatment or other patient management decisions. Negative results must be combined with clinical observations, patient history, and epidemiological information. The expected result is Negative. Fact Sheet for Patients: SugarRoll.be Fact Sheet for Healthcare Providers: https://www.woods-mathews.com/ This test is not yet approved or cleared by the [place] FDA and  has been authorized for detection and/or diagnosis of SARS-CoV-2 by FDA under an Emergency Use Authorization (EUA). This EUA will remain  in effect (meaning this test can be used) for the duration of the COVID-19 declaration under Section 56 4(b)(1) of the Act, 21 U.S.C. section 360bbb-3(b)(1), unless the authorization is terminated or revoked sooner. Performed at [facility], [place] [address], [place],  [address]     Medications:  Clindamycin 900 g IV q 8 hr   Goal of Therapy 20-25 mg/L and Trough <  1 mg/L  Plan:  Gentamicin 430 mg (5mg /kg based on adj.wt)  IV every 24 hrs  Check Scr with next labs if gentamicin continued. Will check gentamicin levels if continued > 72hr or clinically indicated.  Wyline Mood 10/06/2019,8:03 AM

## 2019-10-06 NOTE — Anesthesia Procedure Notes (Signed)
Epidural Patient location during procedure: OB Start time: 10/06/2019 12:50 AM End time: 10/06/2019 12:54 AM  Staffing Anesthesiologist: Lyn Hollingshead, MD Performed: anesthesiologist   Preanesthetic Checklist Completed: patient identified, site marked, surgical consent, pre-op evaluation, timeout performed, IV checked, risks and benefits discussed and monitors and equipment checked  Epidural Patient position: sitting Prep: site prepped and draped and DuraPrep Patient monitoring: continuous pulse ox and blood pressure Approach: midline Location: L3-L4 Injection technique: LOR air  Needle:  Needle type: Tuohy  Needle gauge: 17 G Needle length: 9 cm and 9 Needle insertion depth: 7 cm Catheter type: closed end flexible Catheter size: 19 Gauge Catheter at skin depth: 12 cm Test dose: negative and Other  Assessment Events: blood not aspirated, injection not painful, no injection resistance, negative IV test and no paresthesia  Additional Notes Reason for block:procedure for pain

## 2019-10-06 NOTE — Anesthesia Preprocedure Evaluation (Signed)
Anesthesia Evaluation  Patient identified by MRN, date of birth, ID band Patient awake    Reviewed: Allergy & Precautions, H&P , NPO status , Patient's Chart, lab work & pertinent test results  Airway Mallampati: II  TM Distance: >3 FB Neck ROM: full    Dental no notable dental hx. (+) Teeth Intact   Pulmonary neg pulmonary ROS, former smoker,    Pulmonary exam normal breath sounds clear to auscultation       Cardiovascular negative cardio ROS Normal cardiovascular exam Rhythm:regular Rate:Normal     Neuro/Psych negative neurological ROS  negative psych ROS   GI/Hepatic negative GI ROS, Neg liver ROS,   Endo/Other  diabetes, GestationalMorbid obesity  Renal/GU negative Renal ROS  negative genitourinary   Musculoskeletal negative musculoskeletal ROS (+)   Abdominal (+) + obese,   Peds  Hematology  (+) Blood dyscrasia, anemia ,   Anesthesia Other Findings   Reproductive/Obstetrics (+) Pregnancy                             Anesthesia Physical Anesthesia Plan  ASA: III  Anesthesia Plan: Epidural   Post-op Pain Management:    Induction:   PONV Risk Score and Plan:   Airway Management Planned:   Additional Equipment:   Intra-op Plan:   Post-operative Plan:   Informed Consent: I have reviewed the patients History and Physical, chart, labs and discussed the procedure including the risks, benefits and alternatives for the proposed anesthesia with the patient or authorized representative who has indicated his/her understanding and acceptance.       Plan Discussed with:   Anesthesia Plan Comments:         Anesthesia Quick Evaluation

## 2019-10-06 NOTE — Discharge Summary (Addendum)
Postpartum Discharge Summary     Patient Name: Brandi Andrews DOB: Aug 15, 1991 MRN: [id number]  Date of admission: 10/05/2019 Delivering Provider: Chauncey Mann   Date of discharge: 10/08/2019  Admitting diagnosis: Pregnancy Intrauterine pregnancy: [redacted]w[redacted]d    Secondary diagnosis:  Active Problems:   BMI 40.0-44.9, adult ([place]   Gestational diabetes   Vacuum-assisted vaginal delivery   Type 3a perineal laceration   PPH (postpartum hemorrhage)  Additional problems: None     Discharge diagnosis: Term Pregnancy Delivered, GDM A1 and PPH                                                                                                Post partum procedures: None  Augmentation: AROM, Pitocin, Cytotec and Foley Balloon  Complications: [id number] Hospital course:  Induction of Labor With Vaginal Delivery   28y.o. yo G2P1011 at 49w0das admitted to the hospital 10/05/2019 for induction of labor.  Indication for induction: A1 DM.  Patient had an uncomplicated labor course as follows: Initial SVE: 3.5/30/-3. She had received Foley bulb in clinic the day before which fell out the morning of induction. Patient received Cytotec, Pitocin and had SROM. Received epidural. She then progressed to complete. Patient pushed for 3+ hours and able to progress to +2 station but then with little progress. Infant delivered by vacuum.  Membrane Rupture Time/Date: 8:33 PM ,09/28/2019   Intrapartum Procedures: Episiotomy: None [1]                                         Lacerations:  3rd degree [4]  Patient had delivery of a Viable infant.  Information for the patient's newborn:  HaMahari, Vankirk0[[id number]]Delivery Method: Vaginal, Vacuum (Extractor)(Filed from Delivery Summary)    10/06/2019  Details of delivery can be found in separate delivery note.  AM Hgb 7.5; patient asymptomatic and started on oral iron on discharge. Fasting AM glucose 103; patient to have 2 hour GTT in 6 weeks.  Patient had a routine postpartum course. Patient is discharged home 10/08/19. Delivery time: 9:15 AM    Magnesium Sulfate received: No BMZ received: No Rhophylac:No MMR:No Transfusion:No  Physical exam  Vitals:   10/07/19 0547 10/07/19 1601 10/07/19 2145 10/08/19 0510  BP: 134/74 121/65 (!) 112/57 112/64  Pulse: 97 98 97 98  Resp: '18  18 16  ' Temp: 98.1 F (36.7 C) 98.1 F (36.7 C) 99 F (37.2 C) 97.8 F (36.6 C)  TempSrc: Oral Oral Oral Oral  SpO2: 100% 100%  100%  Weight:      Height:       General: alert, cooperative and no distress Lochia: appropriate Uterine Fundus: firm Incision: N/A DVT Evaluation: No evidence of DVT seen on physical exam. Negative Homan's sign. No cords or calf tenderness. No significant calf/ankle edema. Labs: Lab Results  Component Value Date   WBC 21.2 (H) 10/07/2019   HGB 7.5 (L) 10/07/2019   HCT 22.0 (L) 10/07/2019   MCV 84.6  10/07/2019   PLT 218 10/07/2019   CMP Latest Ref Rng & Units 07/06/2019  Glucose 70 - 99 mg/dL 74  BUN 6 - 20 mg/dL <5(L)  Creatinine 0.44 - 1.00 mg/dL 0.63  Sodium 135 - 145 mmol/L 136  Potassium 3.5 - 5.1 mmol/L 3.7  Chloride 98 - 111 mmol/L 106  CO2 22 - 32 mmol/L 20(L)  Calcium 8.9 - 10.3 mg/dL 8.7(L)  Total Protein 6.5 - 8.1 g/dL 6.9  Total Bilirubin 0.3 - 1.2 mg/dL 0.4  Alkaline Phos 38 - 126 U/L 68  AST 15 - 41 U/L 22  ALT 0 - 44 U/L 20    Discharge instruction: per After Visit Summary and "Baby and Me Booklet".  After visit meds:  Allergies as of 10/08/2019      Reactions   Mushroom Extract Complex Swelling, Other (See Comments)   Lips swell and patient gets dizzy   Penicillins Other (See Comments)   Was told from childhood: Has patient had a PCN reaction causing immediate rash, facial/tongue/throat swelling, SOB or lightheadedness with hypotension: Unknown Has patient had a PCN reaction causing severe rash involving mucus membranes or skin necrosis: Unknown Has patient had a PCN reaction  that required hospitalization: Unknown Has patient had a PCN reaction occurring within the last 10 years: No If all of the above answers are "NO", then may proceed with Cephalosporin use.      Medication List    STOP taking these medications   Accu-Chek Guide Me w/Device Kit   glucose blood test strip   metFORMIN 500 MG tablet Commonly known as: Glucophage   onetouch ultrasoft lancets     TAKE these medications   acetaminophen 325 MG tablet Commonly known as: Tylenol Take 2 tablets (650 mg total) by mouth every 6 (six) hours as needed (for pain scale < 4). What changed:   medication strength  how much to take  reasons to take this   docusate sodium 100 MG capsule Commonly known as: COLACE Take 100 mg by mouth as needed for mild constipation.   ferrous sulfate 325 (65 FE) MG tablet Take 1 tablet (325 mg total) by mouth daily.   ibuprofen 800 MG tablet Commonly known as: ADVIL Take 1 tablet (800 mg total) by mouth every 8 (eight) hours as needed for mild pain.   PRENATAL PO Take by mouth.   senna-docusate 8.6-50 MG tablet Commonly known as: Senokot-S Take 2 tablets by mouth daily. Start taking on: October 09, 2019       Diet: routine diet  Activity: Advance as tolerated. Pelvic rest for 6 weeks.   Outpatient follow up:4 weeks Follow up Appt: Future Appointments  Date Time Provider [place]  10/20/2019  1:30 PM [place]  10/28/2019  8:30 AM Billie Ruddy, MD [facility]  11/03/2019  8:15 AM Constant, Vickii Chafe, MD [facility]  11/17/2019  8:20 AM [facility]   Follow up Visit:   Please schedule this patient for Postpartum visit in: 4 weeks with the following provider: MD High risk pregnancy complicated by: GDM Delivery mode:  Vacuum Anticipated Birth Control:  Condoms PP Procedures needed: 3a tear incision check, GTT  Schedule Integrated BH visit: no  -Message sent to clinic for patient to have colpo at  her PP visit.     Newborn Data: Live born female  Birth Weight: 8 lb 2.3 oz (3694 g) APGAR: 8, 9  Newborn Delivery   Birth date/time: 10/06/2019 09:15:00 Delivery type:  Vaginal, Vacuum (Extractor)      Baby Feeding: Breast Disposition:home with mother   10/08/2019 Chauncey Mann, MD

## 2019-10-06 NOTE — Progress Notes (Signed)
Brandi Andrews is a 28 y.o. G2P0010 at [redacted]w[redacted]d admitted for IOL 2/2 A2gDM  Subjective: Having regular painful contractions. No relief from either Stadol or Fentanyl. Desires epidural. Having a lot of pressure and urge to defecate.  Objective: BP 134/78   Pulse 95   Temp 99 F (37.2 C) (Oral)   Resp 17   Ht 5\' 7"  (1.702 m)   Wt 121.1 kg   LMP 12/30/2018 (Exact Date)   BMI 41.82 kg/m  No intake/output data recorded.  FHT:  FHR: 130 bpm, variability: moderate,  accelerations:  Present,  decelerations:  Present occasional variable UC:   regular, every 2-3 minutes  SVE:   Dilation: 8 Effacement (%): 90 Station: -1 Exam by:: Dr. Darene Lamer  Pitocin @ 10 mu/min  Labs: Lab Results  Component Value Date   WBC 11.0 (H) 10/05/2019   HGB 11.5 (L) 10/05/2019   HCT 33.3 (L) 10/05/2019   MCV 83.7 10/05/2019   PLT 271 10/05/2019    Assessment / Plan: 28 y.o.G2P0010 [redacted]w[redacted]d IOL due to A2gDM.  #Labor:S/p FB, cyto x2. SROM at 2020 hours. Pitocin at 10. #Pain: Desires epidural, no relief from Stadol or Fentanyl. #FWB:CatIIFHT, continue to monitor. Reassuring for moderate variability, presence of accels, and prompt return to baseline. #GBS:negative #A2gDM: not taking metformin.Most recent CBG 94, continue CBGsq2h.  Anticipate vaginal delivery, CS as appropriate  Merilyn Baba DO OB Fellow, Faculty Practice 10/06/2019, 12:57 AM

## 2019-10-06 NOTE — Anesthesia Postprocedure Evaluation (Signed)
Anesthesia Post Note  Patient: Brandi Andrews  Procedure(s) Performed: AN AD [place]     Patient location during evaluation: Mother Baby Anesthesia Type: Epidural Level of consciousness: awake Pain management: satisfactory to patient Vital Signs Assessment: post-procedure vital signs reviewed and stable Respiratory status: spontaneous breathing Cardiovascular status: stable Anesthetic complications: no    Last Vitals:  Vitals:   10/06/19 1145 10/06/19 1334  BP: 133/79 119/66  Pulse: 99 (!) 101  Resp: 18   Temp: 36.9 C   SpO2: 100%     Last Pain:  Vitals:   10/06/19 1330  TempSrc:   PainSc: 0-No pain   Pain Goal: Patients Stated Pain Goal: 9 (10/05/19 1850)                 Casimer Lanius

## 2019-10-06 NOTE — Progress Notes (Signed)
Pt hr 114. Pt stated she has not been drinking. Educated pt that she needs to increase fluid intake to stay hydrated. Educated pt that dehydration could be why hr is increased. Pt understood education and will increase fluid intake. RN will monitor hr. Evangeline Dakin

## 2019-10-06 NOTE — Lactation Note (Signed)
This note was copied from a baby's chart. Lactation Consultation Note;  P1, infant is 66 hours old. Mother is gest diabetic. Infant had one low blood sugar of 38, second  Blood sugar 56.  Assist mother with latching infant on the left breast. Infant sustained latch for 20-25 mins. Observed frequent suckling and audible swallows. Mother very elated that  infant breastfed so well.  Mother reports that she was observing infant sucking lips and she knew he needed to eat. Mother reports that she took 2 virtual breastfeeding classes.  Advised mother to continue to cue base feed infant. Advised mother to page for assistance with positioning and latching. Advised mother to breastfeed infant 8-12 times or more in 24 hours. Mother to continue to do STS.  Basic breastfeeding teaching done. Mother was given [facility] brochure and informed of all available [place] services.   Patient Name: Boy Kynzleigh Cavan [id number] Date: 10/06/2019 Reason for consult: Initial assessment   Maternal Data    Feeding Feeding Type: Breast Fed  LATCH Score Latch: Grasps breast easily, tongue down, lips flanged, rhythmical sucking.  Audible Swallowing: Spontaneous and intermittent  Type of Nipple: Everted at rest and after stimulation  Comfort (Breast/Nipple): Soft / non-tender  Hold (Positioning): Full assist, staff holds infant at breast  LATCH Score: 8  Interventions Interventions: Breast feeding basics reviewed;Assisted with latch;Skin to skin;Hand express;Adjust position;Support pillows;Position options;Expressed milk  Lactation Tools Discussed/Used     Consult Status Consult Status: Follow-up Date: 10/07/19 Follow-up type: In-patient    Jess Barters [facility] 10/06/2019, 3:14 PM

## 2019-10-06 NOTE — Progress Notes (Signed)
Patient ID: Brandi Andrews, female   DOB: November 01, 1990, 28 y.o.   MRN: [id number] Developed an intrapartum fever Vitals:   10/06/19 0631 10/06/19 0720 10/06/19 0730 10/06/19 0740  BP: 132/76  138/74   Pulse: (!) 112  100   Resp: 17     Temp:  100.1 F (37.8 C)  (!) 100.5 F (38.1 C)  TempSrc:  Axillary  Axillary  SpO2:      Weight:      Height:       Will start Gent and Clinda (allergic PCN)

## 2019-10-06 NOTE — Progress Notes (Addendum)
Brandi Andrews is a 28 y.o. G2P0010 at [redacted]w[redacted]d admitted for IOL 2/2 A2gDM  Subjective: Comfortable with epidural, feeling pressure on and off  Objective: BP 126/70   Pulse (!) 102   Temp 99 F (37.2 C) (Oral)   Resp 17   Ht 5\' 7"  (1.702 m)   Wt 121.1 kg   LMP 12/30/2018 (Exact Date)   SpO2 99%   BMI 41.82 kg/m  No intake/output data recorded.  FHT:  FHR: 135 bpm, variability: moderate,  accelerations:  Present,  decelerations:  Absent UC:   regular, every 2-3 minutes  SVE:   Dilation: 10 Effacement (%): 100 Station: Plus 2 Exam by:: Dr. Darene Lamer  Pitocin @ 12 mu/min  Labs: Lab Results  Component Value Date   WBC 11.0 (H) 10/05/2019   HGB 11.5 (L) 10/05/2019   HCT 33.3 (L) 10/05/2019   MCV 83.7 10/05/2019   PLT 271 10/05/2019    Assessment / Plan: 28 y.o.G2P0010 [redacted]w[redacted]d IOL due to A2gDM.  #Labor:S/p FB, cyto x2. SROM at 2020 hours. Pitocin at12.Complete for an hour and a half, laboring down. 20 minutes of pushing (starting at 5:10) with mild effort in hands and knees. Repositioned to throne position. Recommend tug-of-war. #Pain: Comfortable with epidural. #FWB:CatIIFHT, continue to monitor. Reassuring for moderate variability, presence of accels, and prompt return to baseline. #GBS:negative #A2gDM: not taking metformin.Most recent CBG 115, continue CBGsq2h.  Anticipate vaginal delivery, CS as appropriate  Merilyn Baba DO OB Fellow, Faculty Practice 10/06/2019, 5:27 AM

## 2019-10-07 LAB — CBC
HCT: 22 % — ABNORMAL LOW (ref 36.0–46.0)
Hemoglobin: 7.5 g/dL — ABNORMAL LOW (ref 12.0–15.0)
MCH: 28.8 pg (ref 26.0–34.0)
MCHC: 34.1 g/dL (ref 30.0–36.0)
MCV: 84.6 fL (ref 80.0–100.0)
Platelets: 218 10*3/uL (ref 150–400)
RBC: 2.6 MIL/uL — ABNORMAL LOW (ref 3.87–5.11)
RDW: 13.9 % (ref 11.5–15.5)
WBC: 21.2 10*3/uL — ABNORMAL HIGH (ref 4.0–10.5)
nRBC: 0 % (ref 0.0–0.2)

## 2019-10-07 LAB — GLUCOSE, CAPILLARY: Glucose-Capillary: 103 mg/dL — ABNORMAL HIGH (ref 70–99)

## 2019-10-07 MED ORDER — IBUPROFEN 800 MG PO TABS
800.0000 mg | ORAL_TABLET | Freq: Three times a day (TID) | ORAL | Status: DC | PRN
  Administered 2019-10-07 – 2019-10-08 (×3): 800 mg via ORAL
  Filled 2019-10-07 (×3): qty 1

## 2019-10-07 NOTE — Lactation Note (Signed)
This note was copied from a baby's chart. Lactation Consultation Note  Patient Name: Brandi Andrews [id number] Date: 10/07/2019 Reason for consult: Follow-up assessment;Term;1st time breastfeeding;Primapara  LC in to visit with P25 Mom of term baby at 58 hrs old.  Mom has been letting baby latch to the breast, and then she is supplementing with formula by curved tip syringe.  Baby last took 20 ml.  Baby has had 2 stools, but no void yet.    Mom states baby latches, but becomes fussy "because she doesn't have anything".  Spent time explaining how baby latching to the breast in the early days is important to stimulate her milk producing hormones.  Mom hasn't pumped since yesterday.    Baby just had 20 ml formula and is swaddled in crib.  Assisted with double pumping on initiation setting, encouraging breast massage and hand expression.    Encouraged Mom to keep baby STS as much as possible, offering the breast when he cues he is hungry.  If baby is fed formula supplement, recommend that Mom double pump for 15 mins.    Encouraged to call for latch assist prn.   Consult Status Consult Status: Follow-up Date: 10/08/19 Follow-up type: In-patient    Brandi Andrews 10/07/2019, 3:22 PM

## 2019-10-07 NOTE — Progress Notes (Signed)
Post Partum Day 1 Subjective: no complaints, up ad lib, voiding, tolerating PO and intermittent severe cramping.  Wants increased dose of Ibuprofen, declines Oxycodone  Objective: Blood pressure 134/74, pulse 97, temperature 98.1 F (36.7 C), temperature source Oral, resp. rate 18, height 5\' 7"  (1.702 m), weight 121.1 kg, last menstrual period 12/30/2018, SpO2 100 %, unknown if currently breastfeeding.  Physical Exam:  General: alert, cooperative and no distress Lochia: appropriate Uterine Fundus: firm Incision: n/a DVT Evaluation: No evidence of DVT seen on physical exam.  Recent Labs    10/05/19 0817  HGB 11.5*  HCT 33.3*    Assessment/Plan: Plan for discharge tomorrow   LOS: 2 days   Hansel Feinstein 10/07/2019, 6:26 AM

## 2019-10-07 NOTE — Plan of Care (Signed)
  Problem: Skin Integrity: Goal: Risk for impaired skin integrity will decrease Note: Provided and explained sitz bath to patient. Discussed frequency of use. Patient verbalizes understanding. Brandi Andrews, Leretha Dykes [place]

## 2019-10-07 NOTE — Lactation Note (Addendum)
This note was copied from a baby's chart. Lactation Consultation Note Baby 40 hrs old. Mom having difficulty maintaining latch. Baby gets on then pops off shortly after a few suckles. Mom has large pendulous breast w/very short shaft nipples at the bottom of breast. Wash cloth folded under breast to assist in firming soft breast tissue. Taught "C" hold. Hand expression w/No colostrum. Mom stated no increase in size in breast. They are tender. Attempted to latch baby. Mom's breast tissue compressible but baby not able to maintain latch,slips off. Mom had #20 NS. Applied NS, placed baby in football position. Discussed STS, importance of I&O, positioning, support and props. Baby BF w/NS. Has nasal congestion. Encouraged chin into breast, tilting head back slightly to allow baby to breathe better. No transfer noted in NS. Baby hasn't had void in 18 hrs, had 1 stool. Mom shown how to use DEBP & how to disassemble, clean, & reassemble parts.Mom knows to pump q3h for 15-20 min. Discussed w/mom supplementing using curve tip syring. Mom will be BF/formula before she goes to work, but wanted to just BF for a while first. [place] taught syring feeding, then assisted mom. Mom asked about Donor milk. Encouraged mom to ask for next feeding. Gave mom sheet about supplementing and how much to give according to hours of age. Encouraged mom to call for questions or concerns.  Patient Name: Brandi Andrews [id number] Date: 10/07/2019 Reason for consult: Mother's request;Difficult latch;Primapara;Maternal endocrine disorder Type of Endocrine Disorder?: Diabetes   Maternal Data Has patient been taught Hand Expression?: Yes Does the patient have breastfeeding experience prior to this delivery?: No  Feeding Feeding Type: Breast Fed  LATCH Score Latch: Grasps breast easily, tongue down, lips flanged, rhythmical sucking.  Audible Swallowing: None  Type of Nipple: Everted at rest and after stimulation(very  short shaft/semi flat)  Comfort (Breast/Nipple): Soft / non-tender  Hold (Positioning): Assistance needed to correctly position infant at breast and maintain latch.  LATCH Score: 7  Interventions Interventions: Breast feeding basics reviewed;Support pillows;Assisted with latch;Position options;Skin to skin;Breast massage;Hand express;Adjust position;Breast compression;DEBP  Lactation Tools Discussed/Used Tools: Pump;Nipple Shields Nipple shield size: 20 Shell Type: Inverted Breast pump type: Double-Electric Breast Pump WIC Program: Yes Pump Review: Setup, frequency, and cleaning;Milk Storage Initiated by:: Allayne Stack RN IBCLC Date initiated:: 10/07/19   Consult Status Consult Status: Follow-up Date: 10/07/19 Follow-up type: In-patient    Ejay Lashley, Elta [place] 10/07/2019, 3:28 AM

## 2019-10-07 NOTE — Lactation Note (Signed)
This note was copied from a baby's chart. Lactation Consultation Note  Patient Name: Boy Kazlynn Hibberd [id number] Date: 10/07/2019 Reason for consult: Follow-up assessment;Primapara;1st time breastfeeding;Term  47 hours old FT female who is being partially BF and formula feed by his mother, she's a P1. RN Colletta [place] called lactation for assistance because mom hasn't been putting baby to breast or pumping consistently. She' been feeding baby formula with a curve tip syringe, baby is currently on Similac 20 calorie formula. Mom is not using the NS # 20.   Mom said she would like working on BF but not tonight because she just fed baby some formula. She voiced she would like to go home tomorrow morning and she'd like to try to give baby a bottle with a slow flow nipple tonight. Reviewed normal newborn behavior, feeding cues and cluster feeding.  Feeding plan:  1. Encouraged mom to keep baby STS as much as possible, offering the breast on cues when he is hungry.   2. If baby is fed formula supplement, recommend that Mom double pump for 15 minutes, she understands the importance of consistent pumping.  Mom reported all questions and concerns were answered, she's aware of [facility] services and will call PRN.  Maternal Data    Feeding Feeding Type: Formula  LATCH Score Latch: (enc mom to call for latch)                 Interventions Interventions: Breast feeding basics reviewed;Hand express;Breast massage;Breast compression;DEBP  Lactation Tools Discussed/Used Tools: Pump Breast pump type: Double-Electric Breast Pump   Consult Status Consult Status: Follow-up Date: 10/08/19 Follow-up type: In-patient    Emmerich Cryer Francene Boyers 10/07/2019, 7:48 PM

## 2019-10-08 LAB — SURGICAL PATHOLOGY

## 2019-10-08 MED ORDER — SENNOSIDES-DOCUSATE SODIUM 8.6-50 MG PO TABS: 2.0000 | ORAL_TABLET | ORAL | 0 refills | Status: DC

## 2019-10-08 MED ORDER — IBUPROFEN 800 MG PO TABS: 800.0000 mg | ORAL_TABLET | Freq: Three times a day (TID) | ORAL | 0 refills | Status: DC | PRN

## 2019-10-08 MED ORDER — FERROUS SULFATE 325 (65 FE) MG PO TABS: 325.0000 mg | ORAL_TABLET | Freq: Every day | ORAL | 0 refills | Status: DC

## 2019-10-08 MED ORDER — ACETAMINOPHEN 325 MG PO TABS: 650.0000 mg | ORAL_TABLET | Freq: Four times a day (QID) | ORAL | 0 refills | Status: DC | PRN

## 2019-10-08 NOTE — Progress Notes (Addendum)
Mother-Baby AC approached me and asked me to address patient's concerns about her experience in L and D. Per [facility], patient was upset about increase in pitocin , which patient felt was unnecessary. Patient was also upset about developing a fever, and felt that it was caused by frequent vaginal examinations. Patient was also upset about 3rd degree laceration. AC did not know if any other provider had talked to patient yet, so I agreed to see and address patient concerns.   Upon entering the room, patient was cuddling baby and sitting in the chair. I introduced myself and began by offering to talk over any aspect of her labor and delivery with the hopes of addressing her concerns. Patient stated that she was "happy now" and that "many people have talked to me", and she "feels that it has been addressed; she wants to move forward". She said that she has been "praying and meditating about it and at the end of the day " she is glad because she "has a happy baby and healthy mom". She declined to meet with Vesta Mixer, our social worker and therapist. I offered support and said that she could call us anytime if she has questions about her delivery. I stated that she might have some concerns/worries when she has her next child, and that we will support her to have a successful, healthy second pregnancy and delivery. Patient thanked me for my visit and appreciated the attention that has been paid to her complaint.   After discussion with patient, I spoke with Dr. Marice Potter, who confirmed that she had also addressed patient's concerns and that patient expressed that she felt her concerns had been listened to and that she was grateful to have a healthy baby.   Maye Hides, CNM

## 2019-10-08 NOTE — Lactation Note (Addendum)
This note was copied from a baby's chart. Lactation Consultation Note  Patient Name: Boy Annaly Darbonne [id number] Date: 10/08/2019 Reason for consult: Follow-up assessment;Primapara;1st time breastfeeding;Term;Maternal endocrine disorder Type of Endocrine Disorder?: Diabetes  LC in to visit with P56 Mom of term baby at 43 hrs old.  Mom is breastfeeding and formula feeding until her milk volume increases.  Mom aware of importance of frequent breast feedings to support a full milk supply.  Mom encouraged to double pump if baby receives formula.  Mom has a DEBP at home.  Baby being taken to CN for circumcision.   Encouraged Mom to keep baby STS when he returns and offer breast often with cues.  Mom to call prn for assistance and for latch assessment prior to discharge.   Interventions Interventions: Breast feeding basics reviewed;Skin to skin;Breast massage;Hand express;DEBP  Lactation Tools Discussed/Used Tools: Pump;Bottle Breast pump type: Double-Electric Breast Pump   Consult Status Consult Status: Complete Date: 10/08/19 Follow-up type: Call as needed    Broadus John 10/08/2019, 9:32 AM

## 2019-10-12 ENCOUNTER — Telehealth: Payer: Self-pay | Admitting: *Deleted

## 2019-10-12 NOTE — Telephone Encounter (Signed)
I called Brandi Andrews in response to her MyChart message that she felt dizzy and like she was going to pass out. She states she is fine now and blood pressure was normal. We discussed she had her baby recently and had a postpartum hemorrhage and Hgb was low at last check 7.5. She confirmed she is taking her iron daily. I informed her dizziness could be due to her low hemoglobin or meds she had in hospital. I recommended she drink lots of water , fluids including a glass each time she breastfeeds her baby. I instructed her if she keeps getting dizzy, lightheaded she will be need to be evaluated and to call us if we are open or go to hospital if needed. She voices understanding.  Moana Munford,RN

## 2019-10-18 DIAGNOSIS — Z029 Encounter for administrative examinations, unspecified: Secondary | ICD-10-CM

## 2019-10-20 ENCOUNTER — Ambulatory Visit (INDEPENDENT_AMBULATORY_CARE_PROVIDER_SITE_OTHER): Payer: Commercial Managed Care - PPO | Admitting: Obstetrics and Gynecology

## 2019-10-20 ENCOUNTER — Encounter: Payer: Self-pay | Admitting: Obstetrics and Gynecology

## 2019-10-20 ENCOUNTER — Other Ambulatory Visit: Payer: Self-pay

## 2019-10-20 ENCOUNTER — Ambulatory Visit: Payer: Commercial Managed Care - PPO

## 2019-10-20 VITALS — BP 116/71 | HR 101 | Ht 67.0 in | Wt 240.6 lb

## 2019-10-20 DIAGNOSIS — Z8759 Personal history of other complications of pregnancy, childbirth and the puerperium: Secondary | ICD-10-CM

## 2019-10-20 NOTE — Progress Notes (Signed)
[facility] 10/20/2019  CC: 3a laceration routine follow up  Subjective:  28 y/o G2P1011 s/p 12/9 VAVD/3a laceration. 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 Normal lochia and no fevers, chills, belly pain  Review of Systems Pertinent items are noted in HPI.    Objective:    BP 116/71   Pulse (!) 101   Ht 5\' 7"  (1.702 m)   Wt 240 lb 9.6 oz (109.1 kg)   LMP 12/30/2018 (Exact Date)   BMI 37.68 kg/m  General:  alert  Abdomen: soft, non-tender  Pelvic:   EGBUS normal Perineum well healed, intact, approp ttp, no e/o separation Speculum: normal vagina, scant lochia and discharge, normal cervix, well healed vagina. approp ttp     Assessment:   Doing well Plan:   Routine care.  Pt unsure about BC Colposcopy PP  RTC 1 month for regular PP visit and GTT.

## 2019-10-28 ENCOUNTER — Telehealth: Payer: Self-pay

## 2019-10-28 ENCOUNTER — Ambulatory Visit: Payer: Commercial Managed Care - PPO | Admitting: Family Medicine

## 2019-10-28 NOTE — Telephone Encounter (Signed)
Copied from [place] [id number]. Topic: General - Other >> Oct 28, 2019  9:09 AM Leward Quan A wrote: Reason for CRM: Patient called because she thought her appointment was an hour later than what it actually was. She also have a 8 week old baby and it was hard to be up and out that early. Asking if Dr Volanda Napoleon would consider giving her another chance. Ph# [phone]

## 2019-11-02 NOTE — Telephone Encounter (Signed)
Ok to reschedule

## 2019-11-03 ENCOUNTER — Ambulatory Visit: Payer: Commercial Managed Care - PPO | Admitting: Obstetrics and Gynecology

## 2019-11-03 ENCOUNTER — Encounter: Payer: Self-pay | Admitting: Family Medicine

## 2019-11-03 NOTE — Telephone Encounter (Signed)
Pt notified of update. Pt will call back to schedule appt. I was unable to schedule.

## 2019-11-05 NOTE — Telephone Encounter (Signed)
Noted  

## 2019-11-08 NOTE — Telephone Encounter (Signed)
Called lmom for pt to call back to get r/s'd for her newpt appointment with Dr. Volanda Napoleon it was [facility] by doctor to do so.

## 2019-11-10 ENCOUNTER — Other Ambulatory Visit: Payer: Self-pay | Admitting: Lactation Services

## 2019-11-10 DIAGNOSIS — O24415 Gestational diabetes mellitus in pregnancy, controlled by oral hypoglycemic drugs: Secondary | ICD-10-CM

## 2019-11-17 ENCOUNTER — Other Ambulatory Visit: Payer: Commercial Managed Care - PPO

## 2019-11-18 ENCOUNTER — Encounter: Payer: Self-pay | Admitting: Obstetrics & Gynecology

## 2019-11-18 ENCOUNTER — Other Ambulatory Visit (HOSPITAL_COMMUNITY)
Admission: RE | Admit: 2019-11-18 | Discharge: 2019-11-18 | Disposition: A | Discharge status: Home / Self Care | Payer: Commercial Managed Care - PPO | Source: Ambulatory Visit | Attending: Obstetrics & Gynecology | Admitting: Obstetrics & Gynecology

## 2019-11-18 ENCOUNTER — Ambulatory Visit (INDEPENDENT_AMBULATORY_CARE_PROVIDER_SITE_OTHER): Payer: Commercial Managed Care - PPO | Admitting: Obstetrics & Gynecology

## 2019-11-18 ENCOUNTER — Other Ambulatory Visit: Payer: Self-pay

## 2019-11-18 ENCOUNTER — Other Ambulatory Visit: Payer: Commercial Managed Care - PPO

## 2019-11-18 VITALS — BP 105/71 | HR 62 | Wt 240.0 lb

## 2019-11-18 DIAGNOSIS — Z1389 Encounter for screening for other disorder: Secondary | ICD-10-CM | POA: Diagnosis not present

## 2019-11-18 DIAGNOSIS — N879 Dysplasia of cervix uteri, unspecified: Secondary | ICD-10-CM | POA: Diagnosis not present

## 2019-11-18 DIAGNOSIS — Z3202 Encounter for pregnancy test, result negative: Secondary | ICD-10-CM | POA: Diagnosis not present

## 2019-11-18 DIAGNOSIS — O24415 Gestational diabetes mellitus in pregnancy, controlled by oral hypoglycemic drugs: Secondary | ICD-10-CM

## 2019-11-18 LAB — POCT PREGNANCY, URINE: Preg Test, Ur: NEGATIVE

## 2019-11-18 NOTE — Patient Instructions (Signed)
Colposcopy, Care After This sheet gives you information about how to care for yourself after your procedure. Your doctor may also give you more specific instructions. If you have problems or questions, contact your doctor. What can I expect after the procedure? If you did not have a tissue sample removed (did not have a biopsy), you may only have some spotting for a few days. You can go back to your normal activities. If you had a tissue sample removed, it is common to have:  Soreness and pain. This may last for a few days.  Light-headedness.  Mild bleeding from your vagina or dark-colored, grainy discharge from your vagina. This may last for a few days. You may need to wear a sanitary pad.  Spotting for at least 48 hours after the procedure. Follow these instructions at home:   Take over-the-counter and prescription medicines only as told by your doctor. Ask your doctor what medicines you can start taking again. This is very important if you take blood-thinning medicine.  Do not drive or use heavy machinery while taking prescription pain medicine.  For 3 days, or as long as your doctor tells you, avoid: ? Douching. ? Using tampons. ? Having sex.  If you use birth control (contraception), keep using it.  Limit activity for the first day after the procedure. Ask your doctor what activities are safe for you.  It is up to you to get the results of your procedure. Ask your doctor when your results will be ready.  Keep all follow-up visits as told by your doctor. This is important. Contact a doctor if:  You get a skin rash. Get help right away if:  You are bleeding a lot from your vagina. It is a lot of bleeding if you are using more than one pad an hour for 2 hours in a row.  You have clumps of blood (blood clots) coming from your vagina.  You have a fever.  You have chills  You have pain in your lower belly (pelvic area).  You have signs of infection, such as vaginal  discharge that is: ? Different than usual. ? Yellow. ? Bad-smelling.  You have very pain or cramps in your lower belly that do not get better with medicine.  You feel light-headed.  You feel dizzy.  You pass out (faint). Summary  If you did not have a tissue sample removed (did not have a biopsy), you may only have some spotting for a few days. You can go back to your normal activities.  If you had a tissue sample removed, it is common to have mild pain and spotting for 48 hours.  For 3 days, or as long as your doctor tells you, avoid douching, using tampons and having sex.  Get help right away if you have bleeding, very bad pain, or signs of infection. This information is not intended to replace advice given to you by your health care provider. Make sure you discuss any questions you have with your health care provider. Document Revised: 09/26/2017 Document Reviewed: 07/03/2016 Elsevier Patient Education  2020 Elsevier Inc.  

## 2019-11-18 NOTE — Progress Notes (Signed)
Subjective:     Brandi Andrews is a 29 y.o. female who presents for a postpartum visit. She is 6 weeks postpartum following a spontaneous vaginal delivery. I have fully reviewed the prenatal and intrapartum course. The delivery was at 39.6 gestational weeks. Outcome: spontaneous vaginal delivery. Anesthesia: epidural. Postpartum course has been normal. Baby's course has been good. Baby is feeding by breast. Bleeding no bleeding. Bowel function is normal. Bladder function is normal. Patient is not sexually active. Contraception method is condoms. Postpartum depression screening: negative.  The following portions of the patient's history were reviewed and updated as appropriate: allergies, current medications, past family history, past medical history, past social history, past surgical history and problem list.  Review of Systems Pertinent items are noted in HPI.   Objective:    BP 105/71   Pulse 62   Wt 240 lb (108.9 kg)   LMP 12/30/2018 (Exact Date)   BMI 37.59 kg/m   General:  alert, cooperative and no distress           Abdomen: soft, non-tender; bowel sounds normal; no masses,  no organomegaly   Vulva:  normal  Vagina: normal vagina  Cervix:  no lesions  Corpus: not examined  Adnexa:  not evaluated  Rectal Exam: Not performed.        Assessment:     normal postpartum exam. Pap smear not done at today's visit.   Plan:    1. Contraception: condoms 2. Colposcopy done today 3. Follow up as needed.    Woodroe Mode, MD 11/18/2019

## 2019-11-18 NOTE — Progress Notes (Deleted)
Subjective:     Brandi Andrews is a 29 y.o. female who presents for a postpartum visit. She is {1-10:13787} {time; units:18646} postpartum following a {delivery:12449}. I have fully reviewed the prenatal and intrapartum course. The delivery was at *** gestational weeks. Outcome: {delivery outcome:32078}. Anesthesia: {anesthesia types:812}. Postpartum course has been ***. Baby's course has been ***. Baby is feeding by {breast/bottle:69}. Bleeding {vag bleed:12292}. Bowel function is {normal:32111}. Bladder function is {normal:32111}. Patient {is/is not:9024} sexually active. Contraception method is {contraceptive method:5051}. Postpartum depression screening: {neg default:13464::"negative"}.  {Common ambulatory SmartLinks:19316}  Review of Systems {ros; complete:30496}   Objective:    BP 105/71   Pulse 62   Wt 240 lb (108.9 kg)   LMP 12/30/2018 (Exact Date)   BMI 37.59 kg/m   General:  {gen appearance:16600}   Breasts:  {breast exam:1202::"inspection negative, no nipple discharge or bleeding, no masses or nodularity palpable"}  Lungs: {lung exam:16931}  Heart:  {heart exam:5510}  Abdomen: {abdomen exam:16834}   Vulva:  {labia exam:12198}  Vagina: {vagina exam:12200}  Cervix:  {cervix exam:14595}  Corpus: {uterus exam:12215}  Adnexa:  {adnexa exam:12223}  Rectal Exam: {rectal/vaginal exam:12274}        Assessment:    *** postpartum exam. Pap smear {done:10129} at today's visit.   Plan:    1. Contraception: {method:5051} 2. *** 3. Follow up in: {1-10:13787} {time; units:19136} or as needed.

## 2019-11-18 NOTE — Progress Notes (Signed)
Patient ID: Brandi Andrews, female   DOB: [birth year], 29 y.o.   MRN: [id number]  Chief Complaint  Patient presents with  . Colposcopy    HPI Brandi Andrews is a 29 y.o. female.  [id number] Patient's last menstrual period was 12/30/2018 (exact date).  HPI  Indications: Pap smear on November 2019 showed: low-grade squamous intraepithelial neoplasia (LGSIL - encompassing HPV,mild dysplasia,CIN I). Previous colposcopy: normal exam without visible patholog. Prior cervical treatment: no treatment.  Past Medical History:  Diagnosis Date  . Gestational diabetes    prescribed metformin but not taking it  . Medical history non-contributory   . Obesity   . Ovarian cyst     Past Surgical History:  Procedure Laterality Date  . EYE SURGERY    . LAPAROSCOPIC SALPINGO OOPHERECTOMY Right 03/03/2018   Procedure: LAPAROSCOPIC SALPINGO OOPHORECTOMY;  Surgeon: Osborne Oman, MD;  Location: [facility];  Service: Gynecology;  Laterality: Right;    Family History  Problem Relation Age of Onset  . Diabetes Mother   . Breast cancer Mother 39  . Hypertension Father     Social History Social History   Tobacco Use  . Smoking status: Former Smoker    Packs/day: 0.10    Years: 4.00    Pack years: 0.40    Types: Cigarettes  . Smokeless tobacco: Never Used  Substance Use Topics  . Alcohol use: Not Currently  . Drug use: No    Allergies  Allergen Reactions  . Mushroom Extract Complex Swelling and Other (See Comments)    Lips swell and patient gets dizzy  . Penicillins Other (See Comments)    Was told from childhood: Has patient had a PCN reaction causing immediate rash, facial/tongue/throat swelling, SOB or lightheadedness with hypotension: Unknown Has patient had a PCN reaction causing severe rash involving mucus membranes or skin necrosis: Unknown Has patient had a PCN reaction that required hospitalization: Unknown Has patient had a PCN reaction occurring within the last 10 years:  No If all of the above answers are "NO", then may proceed with Cephalosporin use.     Current Outpatient Medications  Medication Sig Dispense Refill  . acetaminophen (TYLENOL) 325 MG tablet Take 2 tablets (650 mg total) by mouth every 6 (six) hours as needed (for pain scale < 4). 60 tablet 0  . ferrous sulfate 325 (65 FE) MG tablet Take 1 tablet (325 mg total) by mouth daily. 90 tablet 0  . ibuprofen (ADVIL) 800 MG tablet Take 1 tablet (800 mg total) by mouth every 8 (eight) hours as needed for mild pain. 30 tablet 0  . Prenatal Vit-Fe Fumarate-FA (PRENATAL PO) Take by mouth.    . docusate sodium (COLACE) 100 MG capsule Take 100 mg by mouth as needed for mild constipation.    . senna-docusate (SENOKOT-S) 8.6-50 MG tablet Take 2 tablets by mouth daily. (Patient not taking: Reported on 11/18/2019) 60 tablet 0   No current facility-administered medications for this visit.    Review of Systems Review of Systems  Blood pressure 105/71, pulse 62, weight 240 lb (108.9 kg), last menstrual period 12/30/2018, unknown if currently breastfeeding.  Physical Exam Physical Exam  Data Reviewed Pap result   Assessment    Procedure Details  The risks and benefits of the procedure and Written informed consent obtained.  Speculum placed in vagina and excellent visualization of cervix achieved, cervix swabbed x 3 with acetic acid solution. Patient given informed consent, signed copy in the chart, time out was performed.  Placed in lithotomy position. Cervix viewed with speculum and colposcope after application of acetic acid.   Colposcopy adequate?  yes Acetowhite lesions? minimal Punctation? no Mosaicism?  no Abnormal vasculature?  no Biopsies? yes ECC? yes  COMMENTS:  .   Specimens: ECC, Bx 12 and 6   Complications: none.     Plan    Specimens labelled and sent to Pathology.    Patient was given post procedure instructions.  She will be notified for results   Emeterio Reeve 11/18/2019, 1:01 PM

## 2019-11-22 LAB — SURGICAL PATHOLOGY

## 2019-11-22 LAB — GLUCOSE TOLERANCE, 2 HOURS
Glucose, 2 hour: 76 mg/dL (ref 65–139)
Glucose, GTT - Fasting: 97 mg/dL (ref 65–99)

## 2019-11-22 NOTE — Progress Notes (Signed)
LSIL result, repeat pap in 12 months

## 2019-11-23 ENCOUNTER — Encounter: Payer: Self-pay | Admitting: General Practice

## 2019-11-23 ENCOUNTER — Telehealth (INDEPENDENT_AMBULATORY_CARE_PROVIDER_SITE_OTHER): Payer: Commercial Managed Care - PPO | Admitting: Lactation Services

## 2019-11-23 DIAGNOSIS — N879 Dysplasia of cervix uteri, unspecified: Secondary | ICD-10-CM

## 2019-11-23 NOTE — Telephone Encounter (Signed)
-----   Message from Woodroe Mode, MD sent at 11/22/2019  3:57 PM EST ----- LSIL result, repeat pap in 12 months

## 2019-11-23 NOTE — Telephone Encounter (Signed)
Called pt to inform her of Colpo Results were the same as her Pap and to follow up with a pap in 1 year  Pt reports her bleeding stopped after having her infant. Pt reports she has been bleeding since procedure and it is heavier that what she had after her last Colpo. She reports she is not soaking pads. She reports the bleeding is a little heavier than her periods usually were. Pt denies cramping. She has passed some small clots x 2 on about day 3 and noted with wiping, they are not longer present. She reports the bleeding is not lessening like she usually does with a period. She is changing her "thick" pad about 3 x a day.   Pt reports she was exclusively BF and has now started pumping and bottle feeding in preparation for returning to work. Discussed that with pumping and bottle feeding it is likely that her periods have resumed and that if bleeding continues past 10 days to let us know. Discussed that periods post baby may be different that pre baby also.   Pt voiced understanding and is to call if bleeding does not stop or she has further questions or concerns.

## 2019-12-21 ENCOUNTER — Telehealth: Payer: Self-pay

## 2019-12-21 NOTE — Telephone Encounter (Addendum)
Pt left VM requesting a call back.   Called pt; VM left stating I am returning pt's message and encouraged pt to call back or send MyChart message with more information about how we can help her.

## 2019-12-24 ENCOUNTER — Other Ambulatory Visit: Payer: Self-pay

## 2019-12-24 ENCOUNTER — Encounter: Payer: Commercial Managed Care - PPO | Admitting: Obstetrics & Gynecology

## 2019-12-24 NOTE — Progress Notes (Signed)
Patient stated no longer having bleeding, spoke with Dr. Roselie Awkward and asked if we need to continue visit since she has no bleeding & he stated if patient is satisfied then no we don't.

## 2019-12-24 NOTE — Progress Notes (Signed)
I connected with  Brandi Andrews on 12/24/19 at  9:55 AM EST by telephone and verified that I am speaking with the correct person using two identifiers.   I discussed the limitations, risks, security and privacy concerns of performing an evaluation and management service by telephone and the availability of in person appointments. I also discussed with the patient that there may be a patient responsible charge related to this service. The patient expressed understanding and agreed to proceed.  Bethanne Ginger, CMA 12/24/2019  9:49 AM

## 2020-01-06 ENCOUNTER — Ambulatory Visit: Payer: Commercial Managed Care - PPO | Admitting: Family Medicine

## 2020-02-17 ENCOUNTER — Other Ambulatory Visit: Payer: Self-pay

## 2020-02-17 ENCOUNTER — Encounter (HOSPITAL_COMMUNITY): Payer: Self-pay

## 2020-02-17 ENCOUNTER — Ambulatory Visit (HOSPITAL_COMMUNITY)
Admission: EM | Admit: 2020-02-17 | Discharge: 2020-02-17 | Disposition: A | Discharge status: Home / Self Care | Payer: Commercial Managed Care - PPO | Attending: Family Medicine | Admitting: Family Medicine

## 2020-02-17 DIAGNOSIS — Z803 Family history of malignant neoplasm of breast: Secondary | ICD-10-CM | POA: Diagnosis not present

## 2020-02-17 DIAGNOSIS — Z79899 Other long term (current) drug therapy: Secondary | ICD-10-CM | POA: Diagnosis not present

## 2020-02-17 DIAGNOSIS — J069 Acute upper respiratory infection, unspecified: Secondary | ICD-10-CM | POA: Insufficient documentation

## 2020-02-17 DIAGNOSIS — Z833 Family history of diabetes mellitus: Secondary | ICD-10-CM | POA: Insufficient documentation

## 2020-02-17 DIAGNOSIS — Z20822 Contact with and (suspected) exposure to covid-19: Secondary | ICD-10-CM | POA: Diagnosis not present

## 2020-02-17 DIAGNOSIS — Z87891 Personal history of nicotine dependence: Secondary | ICD-10-CM | POA: Diagnosis not present

## 2020-02-17 DIAGNOSIS — Z8249 Family history of ischemic heart disease and other diseases of the circulatory system: Secondary | ICD-10-CM | POA: Diagnosis not present

## 2020-02-17 DIAGNOSIS — Z880 Allergy status to penicillin: Secondary | ICD-10-CM | POA: Insufficient documentation

## 2020-02-17 MED ORDER — BENZONATATE 200 MG PO CAPS: 200.0000 mg | ORAL_CAPSULE | Freq: Three times a day (TID) | ORAL | 0 refills | Status: AC | PRN

## 2020-02-17 MED ORDER — IBUPROFEN 800 MG PO TABS: 800.0000 mg | ORAL_TABLET | Freq: Three times a day (TID) | ORAL | 0 refills | Status: DC

## 2020-02-17 MED ORDER — DM-GUAIFENESIN ER 30-600 MG PO TB12: 1.0000 | ORAL_TABLET | Freq: Two times a day (BID) | ORAL | 0 refills | Status: DC

## 2020-02-17 NOTE — ED Triage Notes (Signed)
Pt states she has body aches , runny nose and cough x 2 days

## 2020-02-17 NOTE — Discharge Instructions (Signed)
Covid test pending, monitor my chart for results Symptoms most likely viral upper respiratory infection, I would expect this to gradually resolve over the next 1 to 2 weeks Please rest and drink plenty of fluids Tessalon/benzonatate every 8 hours as needed for cough May use Mucinex DM twice daily for cough/congestion Ibuprofen and Tylenol for headaches, body aches and any fevers  Please follow-up if any symptoms not improving or worsening

## 2020-02-17 NOTE — ED Provider Notes (Signed)
[facility]    CSN: [id number] Arrival date & time: 02/17/20  1509      History   Chief Complaint Chief Complaint  Patient presents with  . Generalized Body Aches    HPI Brandi Andrews is a 29 y.o. female no significant past medical history presenting today for evaluation of URI symptoms.  Patient notes that over the past 2 to 3 days she has developed headache, cough, congestion as well as fatigue and body aches.  She reports low-grade fevers.  Denies chest pain or shortness of breath.  Denies any close sick contacts.  Denies GI symptoms of nausea vomiting or diarrhea.   HPI  Past Medical History:  Diagnosis Date  . Gestational diabetes    prescribed metformin but not taking it  . Medical history non-contributory   . Obesity   . Ovarian cyst     Patient Active Problem List   Diagnosis Date Noted  . Sickle cell trait ([place]) 05/05/2019  . Noncompliance 03/03/2018  . Cervical dysplasia 03/04/2017  . BMI 40.0-44.9, adult ([place]) 02/26/2017    Past Surgical History:  Procedure Laterality Date  . EYE SURGERY    . LAPAROSCOPIC SALPINGO OOPHERECTOMY Right 03/03/2018   Procedure: LAPAROSCOPIC SALPINGO OOPHORECTOMY;  Surgeon: Osborne Oman, MD;  Location: [facility];  Service: Gynecology;  Laterality: Right;    OB History    Gravida  2   Para  1   Term  1   Preterm      AB  1   Living  1     SAB  1   TAB      Ectopic      Multiple  0   Live Births  1        Obstetric Comments  G1: 2012 early SAB         Home Medications    Prior to Admission medications   Medication Sig Start Date End Date Taking? Authorizing Provider  acetaminophen (TYLENOL) 325 MG tablet Take 2 tablets (650 mg total) by mouth every 6 (six) hours as needed (for pain scale < 4). 10/08/19   Fair, Marin Shutter, MD  benzonatate (TESSALON) 200 MG capsule Take 1 capsule (200 mg total) by mouth 3 (three) times daily as needed for up to 7 days for cough. 02/17/20 02/24/20   Jon Kasparek C, PA-C  dextromethorphan-guaiFENesin (MUCINEX DM) 30-600 MG 12hr tablet Take 1 tablet by mouth 2 (two) times daily. 02/17/20   Imani Sherrin C, PA-C  docusate sodium (COLACE) 100 MG capsule Take 100 mg by mouth as needed for mild constipation.    [provider]  ferrous sulfate 325 (65 FE) MG tablet Take 1 tablet (325 mg total) by mouth daily. 10/08/19 01/06/20  FairMarin Shutter, MD  ibuprofen (ADVIL) 800 MG tablet Take 1 tablet (800 mg total) by mouth 3 (three) times daily. 02/17/20   Taheerah Guldin C, PA-C  Prenatal Vit-Fe Fumarate-FA (PRENATAL PO) Take by mouth.    [provider]  senna-docusate (SENOKOT-S) 8.6-50 MG tablet Take 2 tablets by mouth daily. Patient not taking: Reported on 11/18/2019 10/09/19   Chauncey Mann, MD    Family History Family History  Problem Relation Age of Onset  . Diabetes Mother   . Breast cancer Mother 56  . Hypertension Father     Social History Social History   Tobacco Use  . Smoking status: Former Smoker    Packs/day: 0.10    Years: 4.00  Pack years: 0.40    Types: Cigarettes  . Smokeless tobacco: Never Used  Substance Use Topics  . Alcohol use: Not Currently  . Drug use: No     Allergies   Mushroom extract complex and Penicillins   Review of Systems Review of Systems  Constitutional: Positive for fatigue. Negative for activity change, appetite change, chills and fever.  HENT: Positive for congestion, rhinorrhea and sore throat. Negative for ear pain, sinus pressure and trouble swallowing.   Eyes: Negative for discharge and redness.  Respiratory: Positive for cough. Negative for chest tightness and shortness of breath.   Cardiovascular: Negative for chest pain.  Gastrointestinal: Negative for abdominal pain, diarrhea, nausea and vomiting.  Musculoskeletal: Positive for myalgias.  Skin: Negative for rash.  Neurological: Positive for headaches. Negative for dizziness and light-headedness.      Physical Exam Triage Vital Signs ED Triage Vitals [02/17/20 1543]  Enc Vitals Group     BP 117/71     Pulse Rate 89     Resp 18     Temp 99.3 F (37.4 C)     Temp Source Oral     SpO2 100 %     Weight 237 lb 9.6 oz (107.8 kg)     Height      Head Circumference      Peak Flow      Pain Score 6     Pain Loc      Pain Edu?      Excl. in [place]?    No data found.  Updated Vital Signs BP 117/71 (BP Location: Right Arm)   Pulse 89   Temp 99.3 F (37.4 C) (Oral)   Resp 18   Wt 237 lb 9.6 oz (107.8 kg)   SpO2 100%   BMI 37.21 kg/m   Visual Acuity Right Eye Distance:   Left Eye Distance:   Bilateral Distance:    Right Eye Near:   Left Eye Near:    Bilateral Near:     Physical Exam Vitals and nursing note reviewed.  Constitutional:      Appearance: She is well-developed.     Comments: No acute distress  HENT:     Head: Normocephalic and atraumatic.     Ears:     Comments: Bilateral ears without tenderness to palpation of external auricle, tragus and mastoid, EAC's without erythema or swelling, TM's with good bony landmarks and cone of light. Non erythematous.    Nose: Nose normal.     Comments: Nasal mucosa erythematous and swollen turbinates bilaterally    Mouth/Throat:     Comments: Oral mucosa pink and moist, no tonsillar enlargement or exudate. Posterior pharynx patent and nonerythematous, no uvula deviation or swelling. Normal phonation. Eyes:     Conjunctiva/sclera: Conjunctivae normal.     Comments: Wearing glasses  Cardiovascular:     Rate and Rhythm: Normal rate.  Pulmonary:     Effort: Pulmonary effort is normal. No respiratory distress.     Comments: Breathing comfortably at rest, CTABL, no wheezing, rales or other adventitious sounds auscultated Abdominal:     General: There is no distension.  Musculoskeletal:        General: Normal range of motion.     Cervical back: Neck supple.  Skin:    General: Skin is warm and dry.  Neurological:      Mental Status: She is alert and oriented to person, place, and time.      UC Treatments / Results  Labs (all  labs ordered are listed, but only abnormal results are displayed) Labs Reviewed  SARS CORONAVIRUS 2 (TAT 6-24 HRS)    EKG   Radiology No results found.  Procedures Procedures (including critical care time)  Medications Ordered in UC Medications - No data to display  Initial Impression / Assessment and Plan / UC Course  I have reviewed the triage vital signs and the nursing notes.  Pertinent labs & imaging results that were available during my care of the patient were reviewed by me and considered in my medical decision making (see chart for details).     Covid PCR pending.  2 to 3 days of URI symptoms with cough.  Most likely viral etiology.  Vital signs stable, lungs clear.  Recommending symptomatic and supportive care with close monitoring.  Rest and push fluids.  Discussed strict return precautions. Patient verbalized understanding and is agreeable with plan.  Final Clinical Impressions(s) / UC Diagnoses   Final diagnoses:  Viral URI with cough     Discharge Instructions     Covid test pending, monitor my chart for results Symptoms most likely viral upper respiratory infection, I would expect this to gradually resolve over the next 1 to 2 weeks Please rest and drink plenty of fluids Tessalon/benzonatate every 8 hours as needed for cough May use Mucinex DM twice daily for cough/congestion Ibuprofen and Tylenol for headaches, body aches and any fevers  Please follow-up if any symptoms not improving or worsening   ED Prescriptions    Medication Sig Dispense Auth. Provider   benzonatate (TESSALON) 200 MG capsule Take 1 capsule (200 mg total) by mouth 3 (three) times daily as needed for up to 7 days for cough. 28 capsule Harly Pipkins C, PA-C   ibuprofen (ADVIL) 800 MG tablet Take 1 tablet (800 mg total) by mouth 3 (three) times daily. 21 tablet  Arend Bahl C, PA-C   dextromethorphan-guaiFENesin (MUCINEX DM) 30-600 MG 12hr tablet Take 1 tablet by mouth 2 (two) times daily. 20 tablet Augustine Brannick, [place] C, PA-C     PDMP not reviewed this encounter.   Janith Lima, [place] 02/17/20 1615

## 2020-02-18 LAB — SARS CORONAVIRUS 2 (TAT 6-24 HRS): SARS Coronavirus 2: NEGATIVE

## 2020-05-18 ENCOUNTER — Encounter (HOSPITAL_COMMUNITY): Payer: Self-pay

## 2020-05-18 ENCOUNTER — Other Ambulatory Visit: Payer: Self-pay

## 2020-05-18 ENCOUNTER — Ambulatory Visit (HOSPITAL_COMMUNITY)
Admission: EM | Admit: 2020-05-18 | Discharge: 2020-05-18 | Disposition: A | Discharge status: Home / Self Care | Payer: Commercial Managed Care - PPO | Attending: Urgent Care | Admitting: Urgent Care

## 2020-05-18 DIAGNOSIS — R0981 Nasal congestion: Secondary | ICD-10-CM | POA: Diagnosis not present

## 2020-05-18 DIAGNOSIS — Z791 Long term (current) use of non-steroidal anti-inflammatories (NSAID): Secondary | ICD-10-CM | POA: Diagnosis not present

## 2020-05-18 DIAGNOSIS — R05 Cough: Secondary | ICD-10-CM | POA: Diagnosis present

## 2020-05-18 DIAGNOSIS — Z79899 Other long term (current) drug therapy: Secondary | ICD-10-CM | POA: Insufficient documentation

## 2020-05-18 DIAGNOSIS — J069 Acute upper respiratory infection, unspecified: Secondary | ICD-10-CM | POA: Insufficient documentation

## 2020-05-18 DIAGNOSIS — Z90721 Acquired absence of ovaries, unilateral: Secondary | ICD-10-CM | POA: Insufficient documentation

## 2020-05-18 DIAGNOSIS — Z789 Other specified health status: Secondary | ICD-10-CM

## 2020-05-18 DIAGNOSIS — Z880 Allergy status to penicillin: Secondary | ICD-10-CM | POA: Diagnosis not present

## 2020-05-18 DIAGNOSIS — R0789 Other chest pain: Secondary | ICD-10-CM | POA: Diagnosis not present

## 2020-05-18 DIAGNOSIS — Z20822 Contact with and (suspected) exposure to covid-19: Secondary | ICD-10-CM | POA: Diagnosis not present

## 2020-05-18 DIAGNOSIS — F17210 Nicotine dependence, cigarettes, uncomplicated: Secondary | ICD-10-CM | POA: Insufficient documentation

## 2020-05-18 DIAGNOSIS — R059 Cough, unspecified: Secondary | ICD-10-CM

## 2020-05-18 MED ORDER — BENZONATATE 100 MG PO CAPS: 100.0000 mg | ORAL_CAPSULE | Freq: Three times a day (TID) | ORAL | 0 refills | Status: DC | PRN

## 2020-05-18 MED ORDER — CETIRIZINE HCL 10 MG PO TABS: 10.0000 mg | ORAL_TABLET | Freq: Every day | ORAL | 0 refills | Status: DC

## 2020-05-18 NOTE — ED Provider Notes (Signed)
[place] [facility]   MRN: [id number] DOB: 1991/07/27  Subjective:   Brandi Andrews is a 29 y.o. female presenting for 3 to 5-day history of persistent sinus congestion, postnasal drainage, productive cough that elicits chest pain. Smokes less than 1/2 ppd. Has not had COVID vaccination.  Denies fevers, sinus pain, ear pain, shortness of breath, nausea, vomiting, belly pain, rashes.  Denies history of asthma, severe allergies.  Has been using multiple over-the-counter medications with minimal relief.  Patient is breast-feeding.  No current facility-administered medications for this encounter.  Current Outpatient Medications:  .  acetaminophen (TYLENOL) 325 MG tablet, Take 2 tablets (650 mg total) by mouth every 6 (six) hours as needed (for pain scale < 4)., Disp: 60 tablet, Rfl: 0 .  dextromethorphan-guaiFENesin (MUCINEX DM) 30-600 MG 12hr tablet, Take 1 tablet by mouth 2 (two) times daily., Disp: 20 tablet, Rfl: 0 .  docusate sodium (COLACE) 100 MG capsule, Take 100 mg by mouth as needed for mild constipation., Disp: , Rfl:  .  ferrous sulfate 325 (65 FE) MG tablet, Take 1 tablet (325 mg total) by mouth daily., Disp: 90 tablet, Rfl: 0 .  ibuprofen (ADVIL) 800 MG tablet, Take 1 tablet (800 mg total) by mouth 3 (three) times daily., Disp: 21 tablet, Rfl: 0 .  Prenatal Vit-Fe Fumarate-FA (PRENATAL PO), Take by mouth., Disp: , Rfl:  .  senna-docusate (SENOKOT-S) 8.6-50 MG tablet, Take 2 tablets by mouth daily. (Patient not taking: Reported on 11/18/2019), Disp: 60 tablet, Rfl: 0   Allergies  Allergen Reactions  . Mushroom Extract Complex Swelling and Other (See Comments)    Lips swell and patient gets dizzy  . Penicillins Other (See Comments)    Was told from childhood: Has patient had a PCN reaction causing immediate rash, facial/tongue/throat swelling, SOB or lightheadedness with hypotension: Unknown Has patient had a PCN reaction causing severe rash involving mucus membranes or skin  necrosis: Unknown Has patient had a PCN reaction that required hospitalization: Unknown Has patient had a PCN reaction occurring within the last 10 years: No If all of the above answers are "NO", then may proceed with Cephalosporin use.     Past Medical History:  Diagnosis Date  . Gestational diabetes    prescribed metformin but not taking it  . Medical history non-contributory   . Obesity   . Ovarian cyst      Past Surgical History:  Procedure Laterality Date  . EYE SURGERY    . LAPAROSCOPIC SALPINGO OOPHERECTOMY Right 03/03/2018   Procedure: LAPAROSCOPIC SALPINGO OOPHORECTOMY;  Surgeon: Osborne Oman, MD;  Location: [facility];  Service: Gynecology;  Laterality: Right;    Family History  Problem Relation Age of Onset  . Diabetes Mother   . Breast cancer Mother 83  . Hypertension Father     Social History   Tobacco Use  . Smoking status: Former Smoker    Packs/day: 0.10    Years: 4.00    Pack years: 0.40    Types: Cigarettes  . Smokeless tobacco: Never Used  Vaping Use  . Vaping Use: Never used  Substance Use Topics  . Alcohol use: Not Currently  . Drug use: No    ROS   Objective:   Vitals: BP 116/66 (BP Location: Left Arm)   Pulse 70   Temp 99.1 F (37.3 C) (Oral)   Resp 17   LMP 05/02/2020   SpO2 97%   Breastfeeding Yes   Physical Exam Constitutional:      General:  She is not in acute distress.    Appearance: Normal appearance. She is well-developed. She is not ill-appearing, toxic-appearing or diaphoretic.  HENT:     Head: Normocephalic and atraumatic.     Right Ear: Tympanic membrane and ear canal normal. No drainage or tenderness. No middle ear effusion. Tympanic membrane is not erythematous.     Left Ear: Tympanic membrane and ear canal normal. No drainage or tenderness.  No middle ear effusion. Tympanic membrane is not erythematous.     Nose: Nose normal. No congestion or rhinorrhea.     Mouth/Throat:     Mouth: Mucous membranes are moist.  No oral lesions.     Pharynx: Oropharynx is clear. No pharyngeal swelling, oropharyngeal exudate, posterior oropharyngeal erythema or uvula swelling.     Tonsils: No tonsillar exudate or tonsillar abscesses.  Eyes:     General: No scleral icterus.       Right eye: No discharge.        Left eye: No discharge.     Extraocular Movements: Extraocular movements intact.     Right eye: Normal extraocular motion.     Left eye: Normal extraocular motion.     Conjunctiva/sclera: Conjunctivae normal.     Pupils: Pupils are equal, round, and reactive to light.  Cardiovascular:     Rate and Rhythm: Normal rate and regular rhythm.     Pulses: Normal pulses.     Heart sounds: Normal heart sounds. No murmur heard.  No friction rub. No gallop.   Pulmonary:     Effort: Pulmonary effort is normal. No respiratory distress.     Breath sounds: Normal breath sounds. No stridor. No wheezing, rhonchi or rales.  Musculoskeletal:     Cervical back: Normal range of motion and neck supple.  Lymphadenopathy:     Cervical: No cervical adenopathy.  Skin:    General: Skin is warm and dry.     Findings: No rash.  Neurological:     General: No focal deficit present.     Mental Status: She is alert and oriented to person, place, and time.  Psychiatric:        Mood and Affect: Mood normal.        Behavior: Behavior normal.        Thought Content: Thought content normal.        Judgment: Judgment normal.     Assessment and Plan :   PDMP not reviewed this encounter.  1. Viral URI   2. Cough   3. Atypical chest pain   4. Nasal congestion   5. Breastfeeding (infant)     Will manage for viral illness such as viral URI, viral syndrome, viral rhinitis, COVID-19. Counseled patient on nature of COVID-19 including modes of transmission, diagnostic testing, management and supportive care.  Offered scripts for symptomatic relief. COVID 19 testing is pending.  Consider oral steroid course in the setting of  breast-feeding, doxycycline if symptoms persist next week.  Counseled patient on potential for adverse effects with medications prescribed/recommended today, ER and return-to-clinic precautions discussed, patient verbalized understanding.     Jaynee Eagles, PA-C 05/18/20 1721

## 2020-05-18 NOTE — ED Triage Notes (Signed)
Pt presents with non productive cough and congestion X 3 days. 

## 2020-05-18 NOTE — Discharge Instructions (Addendum)

## 2020-05-19 LAB — SARS CORONAVIRUS 2 (TAT 6-24 HRS): SARS Coronavirus 2: NEGATIVE

## 2020-10-19 ENCOUNTER — Emergency Department (HOSPITAL_COMMUNITY): Payer: Commercial Managed Care - PPO

## 2020-10-19 ENCOUNTER — Encounter (HOSPITAL_COMMUNITY): Payer: Self-pay

## 2020-10-19 ENCOUNTER — Emergency Department (HOSPITAL_COMMUNITY)
Admission: EM | Admit: 2020-10-19 | Discharge: 2020-10-19 | Disposition: A | Discharge status: Home / Self Care | Payer: Commercial Managed Care - PPO | Attending: Emergency Medicine | Admitting: Emergency Medicine

## 2020-10-19 ENCOUNTER — Other Ambulatory Visit: Payer: Self-pay

## 2020-10-19 DIAGNOSIS — Z87891 Personal history of nicotine dependence: Secondary | ICD-10-CM | POA: Diagnosis not present

## 2020-10-19 DIAGNOSIS — M545 Low back pain, unspecified: Secondary | ICD-10-CM | POA: Diagnosis not present

## 2020-10-19 DIAGNOSIS — Y92410 Unspecified street and highway as the place of occurrence of the external cause: Secondary | ICD-10-CM | POA: Insufficient documentation

## 2020-10-19 LAB — PREGNANCY, URINE: Preg Test, Ur: NEGATIVE

## 2020-10-19 MED ORDER — IBUPROFEN 400 MG PO TABS: 800.0000 mg | ORAL_TABLET | Freq: Once | ORAL | Status: DC

## 2020-10-19 MED ORDER — IBUPROFEN 800 MG PO TABS: 800.0000 mg | ORAL_TABLET | Freq: Three times a day (TID) | ORAL | 0 refills | Status: DC

## 2020-10-19 NOTE — Discharge Instructions (Signed)
Return to the ED with any concerns including weakness of legs, not able to urinate, loss of control of bowel or bladder, or any other alarming symptoms °
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
## 2020-10-19 NOTE — ED Notes (Signed)
Patient returned from xray.

## 2020-10-19 NOTE — ED Notes (Signed)
Patient taken by transport to xray   
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
## 2020-11-28 ENCOUNTER — Ambulatory Visit: Payer: Commercial Managed Care - PPO | Admitting: Internal Medicine

## 2021-01-25 IMAGING — US US MFM FETAL BPP W/O NON-STRESS
1 series · 12 of 20 positions shown · non-contrast
Comparison: none

[Series 1: us mfm fetal bpp w/o non-stress · 20 acquisitions, 12 frames shown]
[im 1/20]
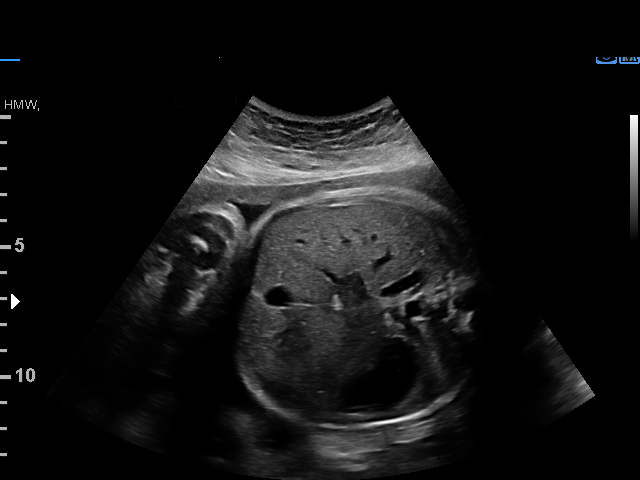
[im 3/20]
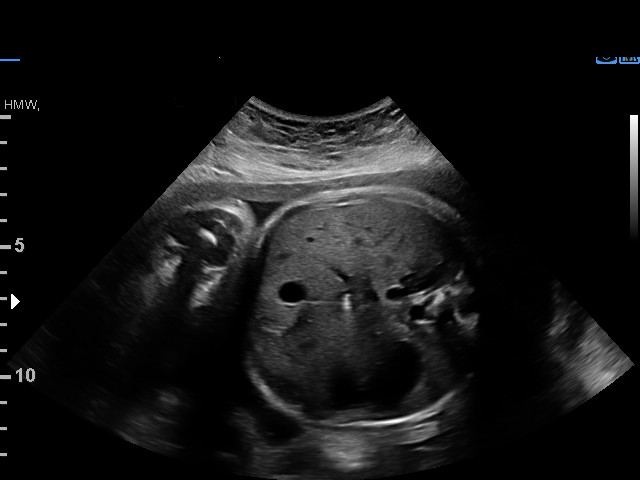
[im 5/20]
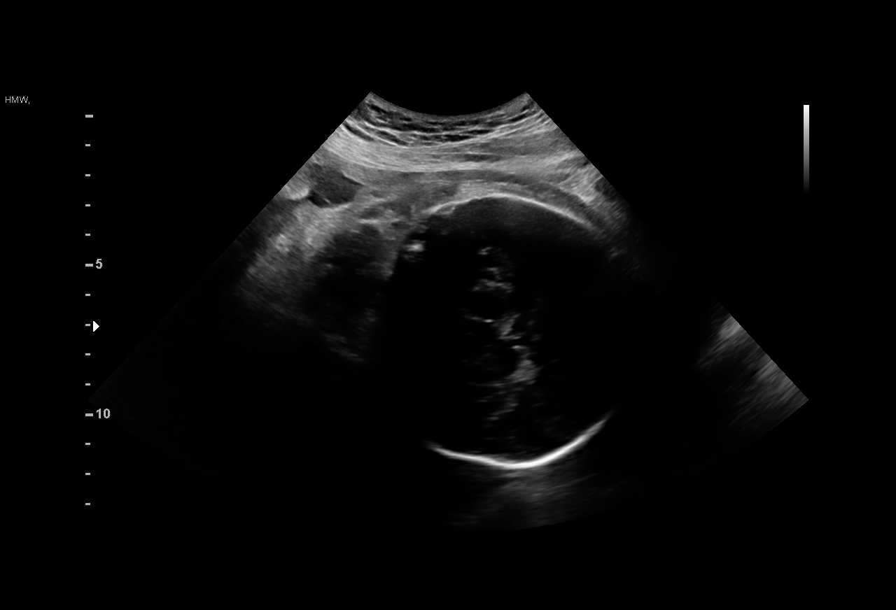
[im 6/20]
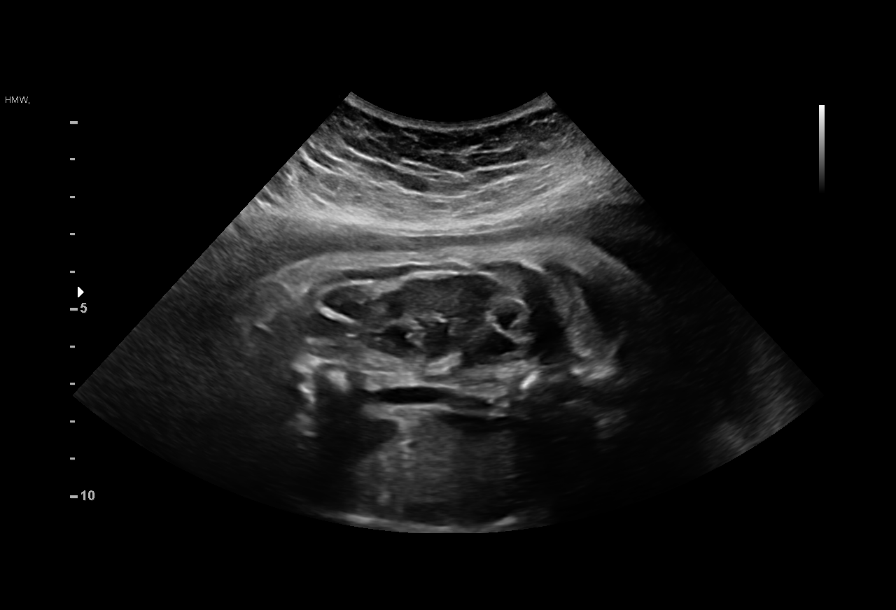
[im 8/20]
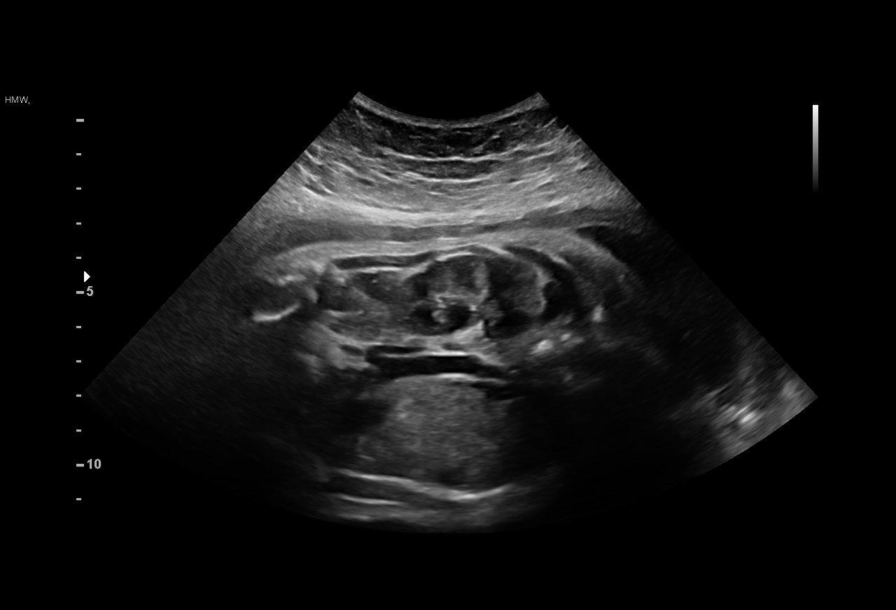
[im 10/20]
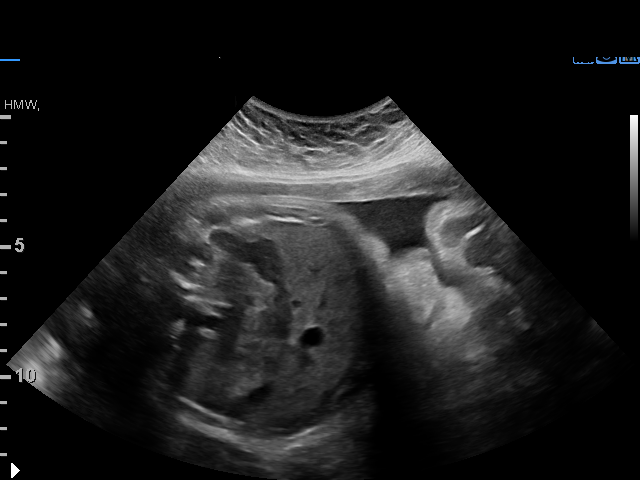
[im 11/20]
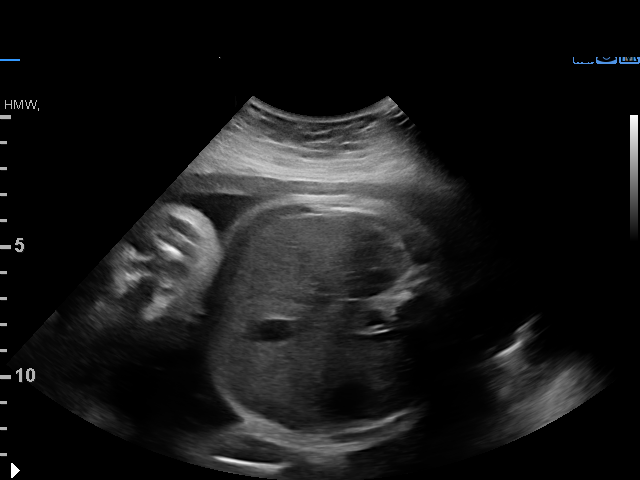
[im 13/20]
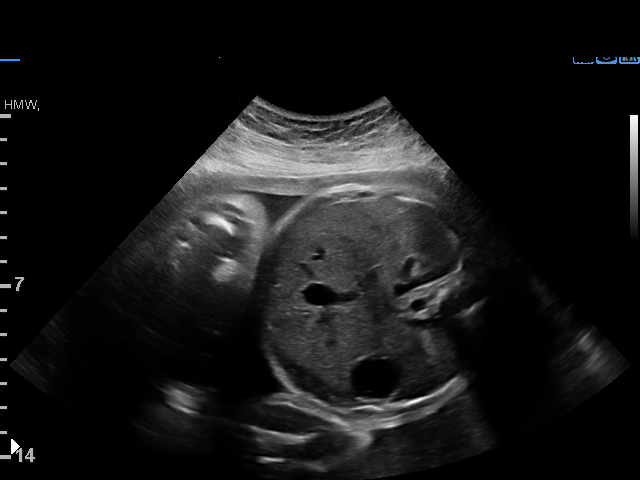
[im 15/20]
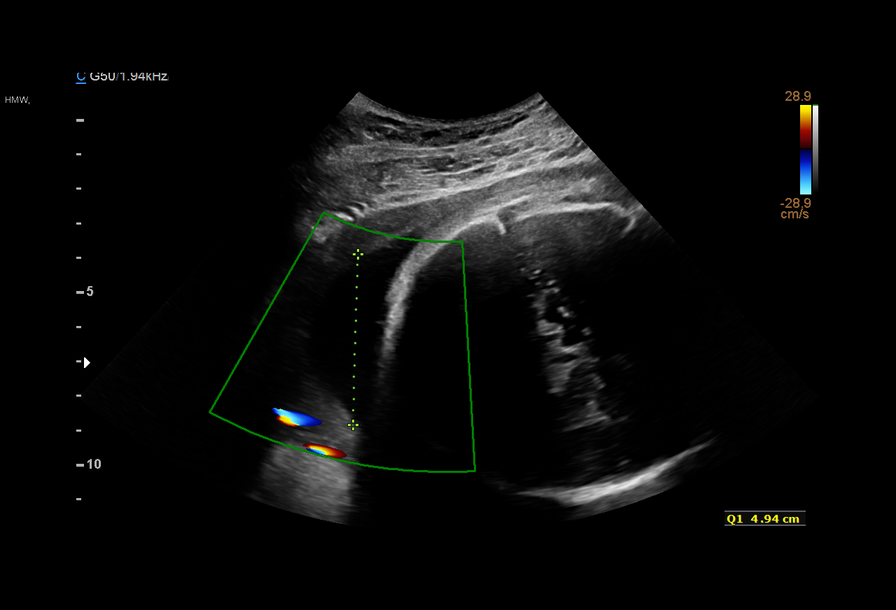
[im 16/20]
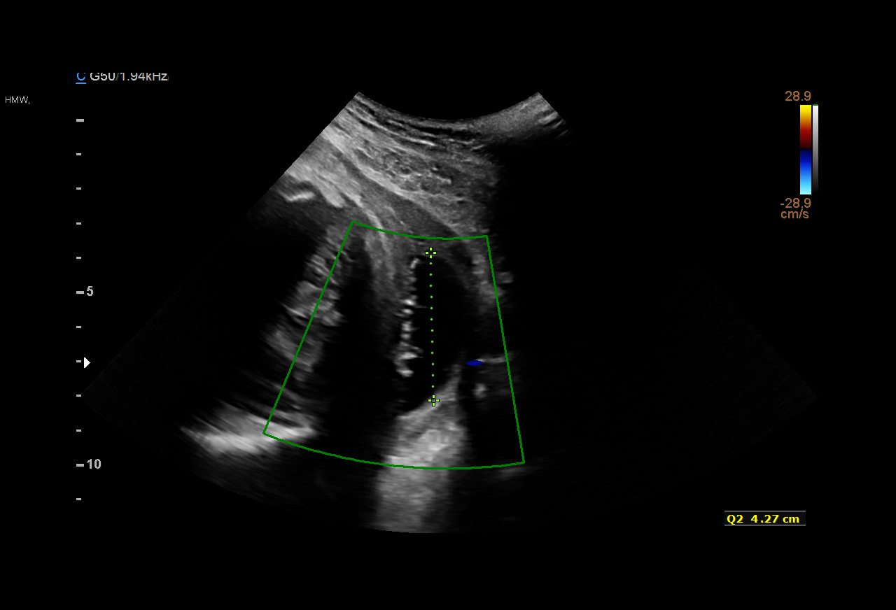
[im 18/20]
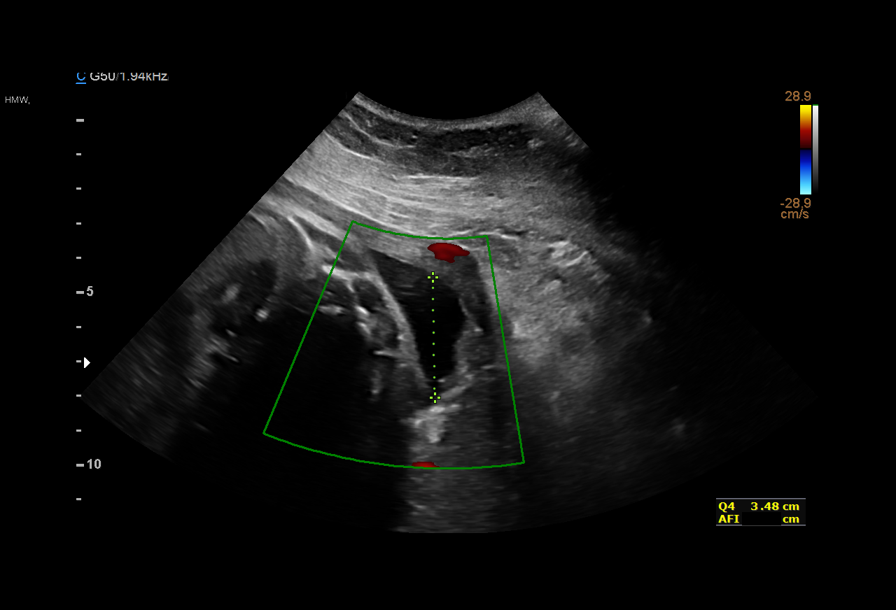
[im 20/20]
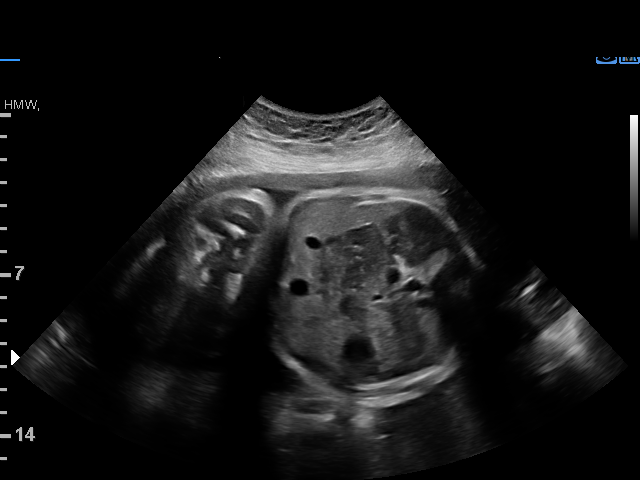

[12 of 20 positions shown; findings below may reference images not displayed]

----------------------------------------------------------------------

 ----------------------------------------------------------------------
Indications

  34 weeks gestation of pregnancy
  Obesity complicating pregnancy, second
  trimester (BMI 37)
  Fetal abnormality - other known or
  suspected (absent left kidney)
  Uterine fibroids
  Encounter for antenatal screening for
  malformations
  Gestational diabetes in pregnancy,
  controlled by oral hypoglycemic drugs
  (metformin)
 ----------------------------------------------------------------------
Vital Signs

                                                Height:        5'7"
Fetal Evaluation

 Num Of Fetuses:          1
 Fetal Heart Rate(bpm):   143
 Cardiac Activity:        Observed
 Presentation:            Cephalic

 Amniotic Fluid
 AFI FV:      Within normal limits

 AFI Sum(cm)     %Tile       Largest Pocket(cm)
 15.48           56

 RUQ(cm)       RLQ(cm)       LUQ(cm)        LLQ(cm)

Biophysical Evaluation
 Amniotic F.V:   Within normal limits       F. Tone:         Observed
 F. Movement:    Observed                   Score:           [DATE]
 F. Breathing:   Observed
OB History

 Gravidity:    2         Term:   0        Prem:   0        SAB:   1
 TOP:          0       Ectopic:  0        Living: 0
Gestational Age

 LMP:           34w 2d        Date:  12/30/18                 EDD:   10/06/19
 Best:          34w 2d     Det. By:  LMP  (12/30/18)          EDD:   10/06/19
Anatomy

 Thoracic:              Appears normal         Kidneys:                Abnormal, see
                                                                       comments
 Abdomen:               Appears normal         Bladder:                Appears normal
Impression

 Biophysical profile [DATE]
 15C21
 Known absent left kidney, again noted today
Recommendations

 Continue weekly testing.
 Follow up growth in 2 weeks.

## 2021-04-09 ENCOUNTER — Ambulatory Visit (HOSPITAL_COMMUNITY)
Admission: EM | Admit: 2021-04-09 | Discharge: 2021-04-09 | Disposition: A | Discharge status: Home / Self Care | Payer: Medicaid Other | Attending: Internal Medicine | Admitting: Internal Medicine

## 2021-04-09 ENCOUNTER — Other Ambulatory Visit: Payer: Self-pay

## 2021-04-09 ENCOUNTER — Encounter (HOSPITAL_COMMUNITY): Payer: Self-pay

## 2021-04-09 DIAGNOSIS — Z1152 Encounter for screening for COVID-19: Secondary | ICD-10-CM

## 2021-04-09 DIAGNOSIS — B349 Viral infection, unspecified: Secondary | ICD-10-CM | POA: Diagnosis present

## 2021-04-09 DIAGNOSIS — J029 Acute pharyngitis, unspecified: Secondary | ICD-10-CM

## 2021-04-09 LAB — SARS CORONAVIRUS 2 (TAT 6-24 HRS): SARS Coronavirus 2: NEGATIVE

## 2021-04-09 LAB — POCT RAPID STREP A, ED / UC: Streptococcus, Group A Screen (Direct): NEGATIVE

## 2021-04-09 MED ORDER — ACETAMINOPHEN 325 MG PO TABS
650.0000 mg | ORAL_TABLET | Freq: Once | ORAL | Status: AC
  Administered 2021-04-09: 650 mg via ORAL

## 2021-04-09 MED ORDER — ACETAMINOPHEN 325 MG PO TABS
ORAL_TABLET | ORAL | Status: AC
  Filled 2021-04-09: qty 2

## 2021-04-09 MED ORDER — IBUPROFEN 800 MG PO TABS
800.0000 mg | ORAL_TABLET | Freq: Once | ORAL | Status: AC
  Administered 2021-04-09: 800 mg via ORAL

## 2021-04-09 MED ORDER — IBUPROFEN 800 MG PO TABS
ORAL_TABLET | ORAL | Status: AC
  Filled 2021-04-09: qty 1

## 2021-04-09 NOTE — ED Provider Notes (Signed)
[facility]    CSN: [id number] Arrival date & time: 04/09/21  1329      History   Chief Complaint Chief Complaint  Patient presents with   Sore Throat   Generalized Body Aches   Headache   Chills    HPI Brandi Andrews is a 30 y.o. female.   Subjective:   Brandi Andrews is a 30 y.o. female who presents for evaluation of symptoms of a URI. Symptoms include chills, headache, myalgias, and sore throat. She's also had some nausea and vomiting. Onset of symptoms was 1 day ago and has been gradually worsening since that time. Patient denies any fevers, congestion, ear pain, lightheadedness, nasal discharge, sinus pressure or sneezing. Patient doesn't have much of an appetite. She is drinking moderate amounts of fluids as it causes discomfort to swallow. Patient hasn't tried anything for her symptoms. No history of COVID. No COVID or FLU vaccine.   The following portions of the patient's history were reviewed and updated as appropriate: allergies, current medications, past family history, past medical history, past social history, past surgical history, and problem list.      Past Medical History:  Diagnosis Date   Gestational diabetes    prescribed metformin but not taking it   Medical history non-contributory    Obesity    Ovarian cyst     Patient Active Problem List   Diagnosis Date Noted   Sickle cell trait ([place]) 05/05/2019   Noncompliance 03/03/2018   Cervical dysplasia 03/04/2017   BMI 40.0-44.9, adult ([place]) 02/26/2017    Past Surgical History:  Procedure Laterality Date   EYE SURGERY     LAPAROSCOPIC SALPINGO OOPHERECTOMY Right 03/03/2018   Procedure: LAPAROSCOPIC SALPINGO OOPHORECTOMY;  Surgeon: Osborne Oman, MD;  Location: [facility];  Service: Gynecology;  Laterality: Right;    OB History     Gravida  2   Para  1   Term  1   Preterm      AB  1   Living  1      SAB  1   IAB      Ectopic      Multiple  0   Live Births  1         Obstetric Comments  G1: 2012 early SAB          Home Medications    Prior to Admission medications   Medication Sig Start Date End Date Taking? Authorizing Provider  acetaminophen (TYLENOL) 325 MG tablet Take 2 tablets (650 mg total) by mouth every 6 (six) hours as needed (for pain scale < 4). 10/08/19   Fair, Marin Shutter, MD  benzonatate (TESSALON) 100 MG capsule Take 1-2 capsules (100-200 mg total) by mouth 3 (three) times daily as needed. 05/18/20   Jaynee Eagles, PA-C  cetirizine (ZYRTEC ALLERGY) 10 MG tablet Take 1 tablet (10 mg total) by mouth daily. 05/18/20   Jaynee Eagles, PA-C  dextromethorphan-guaiFENesin [facility] DM) 30-600 MG 12hr tablet Take 1 tablet by mouth 2 (two) times daily. 02/17/20   Wieters, Hallie C, PA-C  docusate sodium (COLACE) 100 MG capsule Take 100 mg by mouth as needed for mild constipation.    [provider]  ferrous sulfate 325 (65 FE) MG tablet Take 1 tablet (325 mg total) by mouth daily. 10/08/19 01/06/20  FairMarin Shutter, MD  ibuprofen (ADVIL) 800 MG tablet Take 1 tablet (800 mg total) by mouth 3 (three) times daily. 10/19/20   Mabe, Forbes Cellar,  MD  Prenatal Vit-Fe Fumarate-FA (PRENATAL PO) Take by mouth.    [provider]  senna-docusate (SENOKOT-S) 8.6-50 MG tablet Take 2 tablets by mouth daily. Patient not taking: Reported on 11/18/2019 10/09/19   Chauncey Mann, MD    Family History Family History  Problem Relation Age of Onset   Diabetes Mother    Breast cancer Mother 47   Kidney failure Mother    Hypertension Father     Social History Social History   Tobacco Use   Smoking status: Former    Packs/day: 0.10    Years: 4.00    Pack years: 0.40    Types: Cigarettes   Smokeless tobacco: Never  Vaping Use   Vaping Use: Never used  Substance Use Topics   Alcohol use: Not Currently   Drug use: No     Allergies   Mushroom extract complex and Penicillins   Review of Systems Review of Systems  Constitutional:   Positive for chills. Negative for fever.  HENT:  Positive for sore throat and trouble swallowing. Negative for congestion, ear pain, rhinorrhea and sneezing.   Respiratory:  Negative for cough and shortness of breath.   Gastrointestinal:  Positive for nausea and vomiting.  Musculoskeletal:  Positive for myalgias.  Skin:  Negative for rash.  Neurological:  Positive for headaches.    Physical Exam Triage Vital Signs ED Triage Vitals  Enc Vitals Group     BP 04/09/21 1507 (!) 111/55     Pulse Rate 04/09/21 1507 (!) 136     Resp 04/09/21 1507 18     Temp 04/09/21 1507 (!) 102.7 F (39.3 C)     Temp src --      SpO2 04/09/21 1507 100 %     Weight --      Height --      Head Circumference --      Peak Flow --      Pain Score 04/09/21 1504 9     Pain Loc --      Pain Edu? --      Excl. in [place]? --    No data found.  Updated Vital Signs BP (!) 111/55   Pulse (!) 136   Temp (!) 102.7 F (39.3 C)   Resp 18   LMP 03/28/2021   SpO2 100%   Visual Acuity Right Eye Distance:   Left Eye Distance:   Bilateral Distance:    Right Eye Near:   Left Eye Near:    Bilateral Near:     Physical Exam Vitals reviewed.  Constitutional:      General: She is not in acute distress.    Appearance: She is well-developed. She is ill-appearing. She is not toxic-appearing or diaphoretic.  HENT:     Head: Normocephalic.     Right Ear: Tympanic membrane and ear canal normal.     Left Ear: Tympanic membrane and ear canal normal.     Mouth/Throat:     Mouth: Mucous membranes are moist. No oral lesions.     Pharynx: Uvula midline. Posterior oropharyngeal erythema present. No pharyngeal swelling, oropharyngeal exudate or uvula swelling.     Tonsils: No tonsillar exudate or tonsillar abscesses.  Eyes:     Conjunctiva/sclera: Conjunctivae normal.     Pupils: Pupils are equal, round, and reactive to light.  Cardiovascular:     Rate and Rhythm: Tachycardia present.     Heart sounds: Normal heart  sounds.  Pulmonary:     Effort: Pulmonary  effort is normal.     Breath sounds: Normal breath sounds.  Abdominal:     Palpations: Abdomen is soft.  Musculoskeletal:     Cervical back: Normal range of motion and neck supple.  Lymphadenopathy:     Cervical: Cervical adenopathy present.  Skin:    General: Skin is warm and dry.  Neurological:     General: No focal deficit present.     Mental Status: She is alert and oriented to person, place, and time.  Psychiatric:        Mood and Affect: Mood normal.        Behavior: Behavior normal.     UC Treatments / Results  Labs (all labs ordered are listed, but only abnormal results are displayed) Labs Reviewed  SARS CORONAVIRUS 2 (TAT 6-24 HRS)  CULTURE, GROUP A STREP [facility])  POCT RAPID STREP A, ED / UC    EKG   Radiology No results found.  Procedures Procedures (including critical care time)  Medications Ordered in UC Medications  acetaminophen (TYLENOL) tablet 650 mg (650 mg Oral Given 04/09/21 1513)  ibuprofen (ADVIL) tablet 800 mg (800 mg Oral Given 04/09/21 1610)    Initial Impression / Assessment and Plan / UC Course  I have reviewed the triage vital signs and the nursing notes.  Pertinent labs & imaging results that were available during my care of the patient were reviewed by me and considered in my medical decision making (see chart for details).     30 year old female presenting with a 1 day history of worsening chills, headache, myalgias and sore throat.  Denies any fevers at home but is febrile here in the clinic at 102.7.  She is acutely ill appearing but nontoxic.  She has received Tylenol and Motrin in the clinic.  He is drinking water without difficulty.  She has no history of COVID has not been vaccinated against COVID.  Rapid strep negative.  Throat cultures and COVID testing pending.  Symptoms likely due to an acute viral process.  Discussed the importance of avoiding unnecessary antibiotic therapy. Suggested  symptomatic OTC remedies. Follow up as needed.  Today's evaluation has revealed no signs of a dangerous process. Discussed diagnosis with patient and/or guardian. Patient and/or guardian aware of their diagnosis, possible red flag symptoms to watch out for and need for close follow up. Patient and/or guardian understands verbal and written discharge instructions. Patient and/or guardian comfortable with plan and disposition.  Patient and/or guardian has a clear mental status at this time, good insight into illness (after discussion and teaching) and has clear judgment to make decisions regarding their care  This care was provided during an unprecedented National Emergency due to the Novel Coronavirus (COVID-19) pandemic. COVID-19 infections and transmission risks place heavy strains on healthcare resources.  As this pandemic evolves, our facility, providers, and staff strive to respond fluidly, to remain operational, and to provide care relative to available resources and information. Outcomes are unpredictable and treatments are without well-defined guidelines. Further, the impact of COVID-19 on all aspects of urgent care, including the impact to patients seeking care for reasons other than COVID-19, is unavoidable during this national emergency. At this time of the global pandemic, management of patients has significantly changed, even for non-COVID positive patients given high local and regional COVID volumes at this time requiring high healthcare system and resource utilization. The standard of care for management of both COVID suspected and non-COVID suspected patients continues to change rapidly at the local, regional,  national, and global levels. This patient was worked up and treated to the best available but ever changing evidence and resources available at this current time.   Documentation was completed with the aid of voice recognition software. Transcription may contain typographical  errors. Final Clinical Impressions(s) / UC Diagnoses   Final diagnoses:  Viral illness  Encounter for screening for COVID-19  Viral pharyngitis     Discharge Instructions      You may take tylenol or ibuprofen as needed for fevers/headache/body aches. Drink plenty of fluids. Stay in home isolation until you receive results of your COVID test. You will only be notified for positive results. You may go online to MyChart in the next few days and review your results. Go to the ED immediately if you get worse or have any other concerning symptoms.      ED Prescriptions   None    PDMP not reviewed this encounter.   Enrique Sack, [place] 04/09/21 [phone]

## 2021-04-09 NOTE — ED Triage Notes (Signed)
Pt c/o sore throat, body aches, headache, chills, symptoms starting about 3 days ago.

## 2021-04-09 NOTE — Discharge Instructions (Addendum)
You may take tylenol or ibuprofen as needed for fevers/headache/body aches. Drink plenty of fluids. Stay in home isolation until you receive results of your COVID test. You will only be notified for positive results. You may go online to MyChart in the next few days and review your results. Go to the ED immediately if you get worse or have any other concerning symptoms.

## 2021-04-12 LAB — CULTURE, GROUP A STREP (THRC)

## 2022-03-04 ENCOUNTER — Ambulatory Visit (HOSPITAL_COMMUNITY)
Admission: EM | Admit: 2022-03-04 | Discharge: 2022-03-04 | Disposition: A | Discharge status: Home / Self Care | Payer: Medicaid Other | Attending: Physician Assistant | Admitting: Physician Assistant

## 2022-03-04 ENCOUNTER — Encounter (HOSPITAL_COMMUNITY): Payer: Self-pay | Admitting: Emergency Medicine

## 2022-03-04 DIAGNOSIS — M79674 Pain in right toe(s): Secondary | ICD-10-CM | POA: Diagnosis not present

## 2022-03-04 DIAGNOSIS — S90211A Contusion of right great toe with damage to nail, initial encounter: Secondary | ICD-10-CM | POA: Diagnosis not present

## 2022-03-04 MED ORDER — IBUPROFEN 800 MG PO TABS: 800.0000 mg | ORAL_TABLET | Freq: Three times a day (TID) | ORAL | 0 refills | Status: DC

## 2022-03-04 NOTE — ED Triage Notes (Signed)
Acrylic nail on right great toe was hit last night. Reports noticing pain and bleeding from area. Unable to remove nail ?

## 2022-03-04 NOTE — ED Provider Notes (Signed)
?[facility] ? ? ? ?CSN: [id number] ?Arrival date & time: 03/04/22  1317 ? ? ?  ? ?History   ?Chief Complaint ?Chief Complaint  ?Patient presents with  ? Nail Problem  ? ? ?HPI ?ERABELLA Andrews is a 31 y.o. female.  ? ?31 year old female presents with right big toe pain.  Patient indicates that she has an acrylic nail on her right big toe and she struck this on furniture last night.  Patient indicates since then she has been having pain and discomfort on the nail area, mild bleeding present.  Patient has been taking some ibuprofen for pain relief, and that using Epsom salt soaks, minimal relief.  Patient indicates the acrylic nails been present for the past 4 months.  Patient denies any fever, no redness, swelling, or drainage from the area.  Patient request removal of the acrylic nail. ? ? ? ?Past Medical History:  ?Diagnosis Date  ? Gestational diabetes   ? prescribed metformin but not taking it  ? Medical history non-contributory   ? Obesity   ? Ovarian cyst   ? ? ?Patient Active Problem List  ? Diagnosis Date Noted  ? Sickle cell trait ([place]) 05/05/2019  ? Noncompliance 03/03/2018  ? Cervical dysplasia 03/04/2017  ? BMI 40.0-44.9, adult ([place]) 02/26/2017  ? ? ?Past Surgical History:  ?Procedure Laterality Date  ? EYE SURGERY    ? LAPAROSCOPIC SALPINGO OOPHERECTOMY Right 03/03/2018  ? Procedure: LAPAROSCOPIC SALPINGO OOPHORECTOMY;  Surgeon: Osborne Oman, MD;  Location: [facility];  Service: Gynecology;  Laterality: Right;  ? ? ?OB History   ? ? Gravida  ?2  ? Para  ?1  ? Term  ?1  ? Preterm  ?   ? AB  ?1  ? Living  ?1  ?  ? ? SAB  ?1  ? IAB  ?   ? Ectopic  ?   ? Multiple  ?0  ? Live Births  ?1  ?   ?  ? Obstetric Comments  ?G1: 2012 early SAB  ?  ? ?  ? ? ? ?Home Medications   ? ?Prior to Admission medications   ?Medication Sig Start Date End Date Taking? Authorizing Provider  ?acetaminophen (TYLENOL) 325 MG tablet Take 2 tablets (650 mg total) by mouth every 6 (six) hours as needed (for pain scale < 4).  10/08/19   Fair, Marin Shutter, MD  ?benzonatate (TESSALON) 100 MG capsule Take 1-2 capsules (100-200 mg total) by mouth 3 (three) times daily as needed. 05/18/20   Jaynee Eagles, PA-C  ?cetirizine (ZYRTEC ALLERGY) 10 MG tablet Take 1 tablet (10 mg total) by mouth daily. 05/18/20   Jaynee Eagles, PA-C  ?dextromethorphan-guaiFENesin [facility] DM) 30-600 MG 12hr tablet Take 1 tablet by mouth 2 (two) times daily. 02/17/20   Wieters, Hallie C, PA-C  ?docusate sodium (COLACE) 100 MG capsule Take 100 mg by mouth as needed for mild constipation.    [provider]  ?ferrous sulfate 325 (65 FE) MG tablet Take 1 tablet (325 mg total) by mouth daily. 10/08/19 01/06/20  FairMarin Shutter, MD  ?ibuprofen (ADVIL) 800 MG tablet Take 1 tablet (800 mg total) by mouth 3 (three) times daily. 03/04/22   Nyoka Lint, PA-C  ?Prenatal Vit-Fe Fumarate-FA (PRENATAL PO) Take by mouth.    [provider]  ?senna-docusate (SENOKOT-S) 8.6-50 MG tablet Take 2 tablets by mouth daily. ?Patient not taking: Reported on 11/18/2019 10/09/19   Chauncey Mann, MD  ? ? ?Family History ?  Family History  ?Problem Relation Age of Onset  ? Diabetes Mother   ? Breast cancer Mother 82  ? Kidney failure Mother   ? Hypertension Father   ? ? ?Social History ?Social History  ? ?Tobacco Use  ? Smoking status: Former  ?  Packs/day: 0.10  ?  Years: 4.00  ?  Pack years: 0.40  ?  Types: Cigarettes  ? Smokeless tobacco: Never  ?Vaping Use  ? Vaping Use: Never used  ?Substance Use Topics  ? Alcohol use: Not Currently  ? Drug use: No  ? ? ? ?Allergies   ?Mushroom extract complex and Penicillins ? ? ?Review of Systems ?Review of Systems  ?Skin:  Positive for wound (right big toe pain).  ? ? ?Physical Exam ?Triage Vital Signs ?ED Triage Vitals  ?Enc Vitals Group  ?   BP 03/04/22 1518 112/77  ?   Pulse Rate 03/04/22 1518 95  ?   Resp 03/04/22 1518 16  ?   Temp 03/04/22 1518 98.1 ?F (36.7 ?C)  ?   Temp Source 03/04/22 1518 Oral  ?   SpO2 03/04/22 1518 97 %  ?   Weight --    ?   Height --   ?   Head Circumference --   ?   Peak Flow --   ?   Pain Score 03/04/22 1519 3  ?   Pain Loc --   ?   Pain Edu? --   ?   Excl. in [place]? --   ? ?No data found. ? ?Updated Vital Signs ?BP 112/77 (BP Location: Left Arm)   Pulse 95   Temp 98.1 ?F (36.7 ?C) (Oral)   Resp 16   SpO2 97%  ? ?Visual Acuity ?Right Eye Distance:   ?Left Eye Distance:   ?Bilateral Distance:   ? ?Right Eye Near:   ?Left Eye Near:    ?Bilateral Near:    ? ?Physical Exam ?Constitutional:   ?   Appearance: Normal appearance.  ?Skin: ?   Comments: Right foot-big toe: Tenderness on palpation of the right big toe medial aspect.  No swelling or active drainage.  Range of motion is normal, stability is normal.  Acrylic nail present that has advanced distally from the proximal nail margin.  ?Neurological:  ?   Mental Status: She is alert.  ? ? ? ?UC Treatments / Results  ?Labs ?(all labs ordered are listed, but only abnormal results are displayed) ?Labs Reviewed - No data to display ? ?EKG ? ?Radiology ?No results found. ? ?Procedures ?Procedures (including critical care time) ? ?Medications Ordered in UC ?Medications - No data to display ? ?Initial Impression / Assessment and Plan / UC Course  ?I have reviewed the triage vital signs and the nursing notes. ? ?Pertinent labs & imaging results that were available during my care of the patient were reviewed by me and considered in my medical decision making (see chart for details). ? ?  ? ?Plan: ?Patient advised take ibuprofen every 8 hours with food as needed for pain. ?Patient advised to use Epsom salt soaks 3-4 times a day for the next 4 to 5 days. ?Patient has been internally referred to podiatry for evaluation and acrylic nail removal. ?Patient advised to return if symptoms fail to improve. ?Final Clinical Impressions(s) / UC Diagnoses  ? ?Final diagnoses:  ?Contusion of right great toe with damage to nail, initial encounter  ?Pain of right great toe  ? ? ? ?Discharge Instructions    ? ?  ?  Advised to continue ibuprofen 800 mg, 1 every 8 hours with food for pain. ?Advanced Epsom salt soaks, 3-4 times daily, for the next 4 to 5 days. ?Referral to the [facility] foot and ankle for evaluation. ? ? ?ED Prescriptions   ? ? Medication Sig Dispense Auth. Provider  ? ibuprofen (ADVIL) 800 MG tablet Take 1 tablet (800 mg total) by mouth 3 (three) times daily. 21 tablet Nyoka Lint, PA-C  ? ?  ? ?PDMP not reviewed this encounter. ?  ?Nyoka Lint, PA-C ?03/04/22 1611 ? ?

## 2022-03-04 NOTE — Discharge Instructions (Addendum)
Advised to continue ibuprofen 800 mg, 1 every 8 hours with food for pain. ?Advanced Epsom salt soaks, 3-4 times daily, for the next 4 to 5 days. ?Referral to the [facility] foot and ankle for evaluation. ?

## 2022-03-14 ENCOUNTER — Ambulatory Visit (INDEPENDENT_AMBULATORY_CARE_PROVIDER_SITE_OTHER): Payer: Medicaid Other

## 2022-03-14 ENCOUNTER — Ambulatory Visit (INDEPENDENT_AMBULATORY_CARE_PROVIDER_SITE_OTHER): Payer: Medicaid Other | Admitting: Podiatry

## 2022-03-14 DIAGNOSIS — S9031XA Contusion of right foot, initial encounter: Secondary | ICD-10-CM

## 2022-03-14 DIAGNOSIS — S90221A Contusion of right lesser toe(s) with damage to nail, initial encounter: Secondary | ICD-10-CM

## 2022-03-14 NOTE — Patient Instructions (Signed)

## 2022-03-14 NOTE — Progress Notes (Signed)
  Subjective:  Patient ID: Brandi Andrews, female    DOB: [birth year],  MRN: [id number] HPI Chief Complaint  Patient presents with   Foot Injury    Contusion of right great toe with damage to nail, initial encounter, Pain of right great toe     31 y.o. female presents with the above complaint.   ROS: Denies fever chills nausea vomiting muscle aches pains calf pain back pain chest pain shortness of breath.  Past Medical History:  Diagnosis Date   Gestational diabetes    prescribed metformin but not taking it   Medical history non-contributory    Obesity    Ovarian cyst    Past Surgical History:  Procedure Laterality Date   EYE SURGERY     LAPAROSCOPIC SALPINGO OOPHERECTOMY Right 03/03/2018   Procedure: LAPAROSCOPIC SALPINGO OOPHORECTOMY;  Surgeon: Osborne Oman, MD;  Location: [facility];  Service: Gynecology;  Laterality: Right;    Current Outpatient Medications:    docusate sodium (COLACE) 100 MG capsule, Take 100 mg by mouth as needed for mild constipation., Disp: , Rfl:    ibuprofen (ADVIL) 800 MG tablet, Take 1 tablet (800 mg total) by mouth 3 (three) times daily., Disp: 21 tablet, Rfl: 0  Allergies  Allergen Reactions   Mushroom Extract Complex Swelling and Other (See Comments)    Lips swell and patient gets dizzy   Penicillins Other (See Comments)    Was told from childhood: Has patient had a PCN reaction causing immediate rash, facial/tongue/throat swelling, SOB or lightheadedness with hypotension: Unknown Has patient had a PCN reaction causing severe rash involving mucus membranes or skin necrosis: Unknown Has patient had a PCN reaction that required hospitalization: Unknown Has patient had a PCN reaction occurring within the last 10 years: No If all of the above answers are "NO", then may proceed with Cephalosporin use.    Review of Systems Objective:  There were no vitals filed for this visit.  General: Well developed, nourished, in no acute distress, alert  and oriented x3   Dermatological: Skin is warm, dry and supple bilateral. Nails x 10 are well maintained; remaining integument appears unremarkable at this time. There are no open sores, no preulcerative lesions, no rash or signs of infection present.  Hallux nail right is loose with a subungual hematoma present.  There is some serosanguineous drainage from the the free end of the nail.  No purulence no malodor  Vascular: Dorsalis Pedis artery and Posterior Tibial artery pedal pulses are 2/4 bilateral with immedate capillary fill time. Pedal hair growth present. No varicosities and no lower extremity edema present bilateral.   Neruologic: Grossly intact via light touch bilateral. Vibratory intact via tuning fork bilateral. Protective threshold with Semmes Wienstein monofilament intact to all pedal sites bilateral. Patellar and Achilles deep tendon reflexes 2+ bilateral. No Babinski or clonus noted bilateral.   Musculoskeletal: No gross boney pedal deformities bilateral. No pain, crepitus, or limitation noted with foot and ankle range of motion bilateral. Muscular strength 5/5 in all groups tested bilateral.  Gait: Unassisted, Nonantalgic.    Radiographs:  None taken  Assessment & Plan:   Assessment: Subungual hematoma with abscess paronychia hallux right  Plan: Total nail avulsion was performed today all necrotic tissue was sharply resected and removed.  She tolerated this well after local anesthetic was administered a total of 3 cc of 50-50 mixture Marcaine plain lidocaine plain was injected.     Skylur Fuston T. [place], [place]

## 2022-04-02 ENCOUNTER — Ambulatory Visit: Payer: Medicaid Other | Admitting: Podiatry

## 2022-04-04 ENCOUNTER — Ambulatory Visit (INDEPENDENT_AMBULATORY_CARE_PROVIDER_SITE_OTHER): Payer: Medicaid Other | Admitting: Podiatry

## 2022-04-04 ENCOUNTER — Encounter: Payer: Self-pay | Admitting: Podiatry

## 2022-04-04 DIAGNOSIS — Z9889 Other specified postprocedural states: Secondary | ICD-10-CM

## 2022-04-04 DIAGNOSIS — S90221D Contusion of right lesser toe(s) with damage to nail, subsequent encounter: Secondary | ICD-10-CM

## 2022-04-04 NOTE — Progress Notes (Deleted)
Subjective:   Patient ID: Brandi Andrews, female   DOB: 31 y.o.   MRN: [id number]   HPI ***   ROS      Objective:  Physical Exam  ***     Assessment:  ***     Plan:  ***

## 2022-04-04 NOTE — Progress Notes (Signed)
She presents today for follow-up of her nail avulsion hallux right.  She denies fever chills nausea vomiting muscle aches pains states seems to be doing just fine.  Objective: Toenail plate is completely gone and is completely healed no drainage no serosanguineous drainage purulence malodor.  Pulses remain palpable.  No signs of infection.  Assessment: Well-healing surgical toe.  Plan: Follow-up with me as needed

## 2022-04-04 NOTE — Progress Notes (Deleted)
  Subjective:  Patient ID: Brandi Andrews, female    DOB: 1991-09-28,  MRN: [id number] HPI Chief Complaint  Patient presents with   Toe Pain    Follow up nail avulsion right hallux   "    31 y.o. female presents with the above complaint.   ROS: Denies fever chills nausea vomiting muscle aches pains calf pain back pain chest pain shortness of breath.  Past Medical History:  Diagnosis Date   Gestational diabetes    prescribed metformin but not taking it   Medical history non-contributory    Obesity    Ovarian cyst    Past Surgical History:  Procedure Laterality Date   EYE SURGERY     LAPAROSCOPIC SALPINGO OOPHERECTOMY Right 03/03/2018   Procedure: LAPAROSCOPIC SALPINGO OOPHORECTOMY;  Surgeon: Osborne Oman, MD;  Location: [facility];  Service: Gynecology;  Laterality: Right;    Current Outpatient Medications:    docusate sodium (COLACE) 100 MG capsule, Take 100 mg by mouth as needed for mild constipation., Disp: , Rfl:    ibuprofen (ADVIL) 800 MG tablet, Take 1 tablet (800 mg total) by mouth 3 (three) times daily., Disp: 21 tablet, Rfl: 0  Allergies  Allergen Reactions   Mushroom Extract Complex Swelling and Other (See Comments)    Lips swell and patient gets dizzy   Penicillins Other (See Comments)    Was told from childhood: Has patient had a PCN reaction causing immediate rash, facial/tongue/throat swelling, SOB or lightheadedness with hypotension: Unknown Has patient had a PCN reaction causing severe rash involving mucus membranes or skin necrosis: Unknown Has patient had a PCN reaction that required hospitalization: Unknown Has patient had a PCN reaction occurring within the last 10 years: No If all of the above answers are "NO", then may proceed with Cephalosporin use.    Review of Systems Objective:  There were no vitals filed for this visit.  General: Well developed, nourished, in no acute distress, alert and oriented x3   Dermatological: Skin is warm, dry and  supple bilateral. Nails x 10 are well maintained; remaining integument appears unremarkable at this time. There are no open sores, no preulcerative lesions, no rash or signs of infection present.  Plantar fibroma medial longitudinal arch mildly tender on palpation measures less than 1 cm in diameter.  Multiple benign porokeratotic skin lesions.  Vascular: Dorsalis Pedis artery and Posterior Tibial artery pedal pulses are 2/4 bilateral with immedate capillary fill time. Pedal hair growth present. No varicosities and no lower extremity edema present bilateral.   Neruologic: Grossly intact via light touch bilateral. Vibratory intact via tuning fork bilateral. Protective threshold with Semmes Wienstein monofilament intact to all pedal sites bilateral. Patellar and Achilles deep tendon reflexes 2+ bilateral. No Babinski or clonus noted bilateral.   Musculoskeletal: No gross boney pedal deformities bilateral. No pain, crepitus, or limitation noted with foot and ankle range of motion bilateral. Muscular strength 5/5 in all groups tested bilateral.  Gait: Unassisted, Nonantalgic.    Radiographs:  None taken  Assessment & Plan:   Assessment: Benign skin lesions plantar aspect bilateral foot.  Plantar fibroma left.  Plan: Discussed etiology pathology conservative surgical therapies at this point debrided manually and chemical debridement of benign skin lesions to be washed out thoroughly tomorrow.  Offered her an injection to the fibroma she declined.  Follow-up with her in a couple of months if necessary.     Devarius Nelles T. [place], [place]

## 2022-05-17 ENCOUNTER — Encounter: Payer: Self-pay | Admitting: Internal Medicine

## 2022-05-17 ENCOUNTER — Ambulatory Visit (INDEPENDENT_AMBULATORY_CARE_PROVIDER_SITE_OTHER): Payer: PRIVATE HEALTH INSURANCE | Admitting: Internal Medicine

## 2022-05-17 VITALS — BP 124/74 | HR 88 | Resp 18 | Ht 67.0 in | Wt 257.0 lb

## 2022-05-17 DIAGNOSIS — D573 Sickle-cell trait: Secondary | ICD-10-CM

## 2022-05-17 DIAGNOSIS — Z8632 Personal history of gestational diabetes: Secondary | ICD-10-CM | POA: Diagnosis not present

## 2022-05-17 DIAGNOSIS — N879 Dysplasia of cervix uteri, unspecified: Secondary | ICD-10-CM | POA: Diagnosis not present

## 2022-05-17 DIAGNOSIS — Z0001 Encounter for general adult medical examination with abnormal findings: Secondary | ICD-10-CM | POA: Diagnosis not present

## 2022-05-17 DIAGNOSIS — Z113 Encounter for screening for infections with a predominantly sexual mode of transmission: Secondary | ICD-10-CM

## 2022-05-17 DIAGNOSIS — Z6841 Body Mass Index (BMI) 40.0 and over, adult: Secondary | ICD-10-CM

## 2022-05-17 LAB — LIPID PANEL
Cholesterol: 240 mg/dL — ABNORMAL HIGH (ref 0–200)
HDL: 50.6 mg/dL (ref >39.00)
LDL Cholesterol: 156 mg/dL — ABNORMAL HIGH (ref 0–99)
NonHDL: 189.7
Total CHOL/HDL Ratio: 5
Triglycerides: 168 mg/dL — ABNORMAL HIGH (ref 0.0–149.0)
VLDL: 33.6 mg/dL (ref 0.0–40.0)

## 2022-05-17 LAB — COMPREHENSIVE METABOLIC PANEL
ALT: 10 U/L (ref 0–35)
AST: 13 U/L (ref 0–37)
Albumin: 4.7 g/dL (ref 3.5–5.2)
Alkaline Phosphatase: 62 U/L (ref 39–117)
BUN: 11 mg/dL (ref 6–23)
CO2: 24 mEq/L (ref 19–32)
Calcium: 9.5 mg/dL (ref 8.4–10.5)
Chloride: 105 mEq/L (ref 96–112)
Creatinine, Ser: 0.72 mg/dL (ref 0.40–1.20)
GFR: 110.35 mL/min (ref >60.00)
Glucose, Bld: 96 mg/dL (ref 70–99)
Potassium: 4.1 mEq/L (ref 3.5–5.1)
Sodium: 139 mEq/L (ref 135–145)
Total Bilirubin: 0.3 mg/dL (ref 0.2–1.2)
Total Protein: 7.9 g/dL (ref 6.0–8.3)

## 2022-05-17 LAB — CBC
HCT: 37.2 % (ref 36.0–46.0)
Hemoglobin: 12.4 g/dL (ref 12.0–15.0)
MCHC: 33.4 g/dL (ref 30.0–36.0)
MCV: 82.3 fl (ref 78.0–100.0)
Platelets: 364 10*3/uL (ref 150.0–400.0)
RBC: 4.52 Mil/uL (ref 3.87–5.11)
RDW: 15.4 % (ref 11.5–15.5)
WBC: 8.8 10*3/uL (ref 4.0–10.5)

## 2022-05-17 LAB — HEMOGLOBIN A1C: Hgb A1c MFr Bld: 6.5 % (ref 4.6–6.5)

## 2022-05-17 NOTE — Assessment & Plan Note (Signed)
Flu shot yearly. Covid-19 counseled. Tetanus up to date. Pap smear due and prior abnormal so referred to ob/gyn. Counseled about sun safety and mole surveillance. Counseled about the dangers of distracted driving. Given 10 year screening recommendations.

## 2022-05-17 NOTE — Assessment & Plan Note (Signed)
Checking HgA1c as she had no follow up after pregnancy. Prior HgA1c before pregnancy was 5.5. Treat as appropriate.

## 2022-05-17 NOTE — Assessment & Plan Note (Signed)
No treatment required. Checking CBC.

## 2022-05-17 NOTE — Patient Instructions (Signed)
We are checking the labs today and will get you back in for a pap smear.

## 2022-05-17 NOTE — Assessment & Plan Note (Signed)
Checking labs for screening for abnormalities. Counseled about diet and exercise.

## 2022-05-17 NOTE — Progress Notes (Signed)
   Subjective:   Patient ID: Brandi Andrews, female    DOB: Jan 22, 1991, 31 y.o.   MRN: [id number]  HPI The patient is a 31 YO female coming in new for ongoing care and physical.  PMH, [place], social history reviewed and updated  Review of Systems  Constitutional: Negative.   HENT: Negative.    Eyes: Negative.   Respiratory:  Negative for cough, chest tightness and shortness of breath.   Cardiovascular:  Negative for chest pain, palpitations and leg swelling.  Gastrointestinal:  Negative for abdominal distention, abdominal pain, constipation, diarrhea, nausea and vomiting.  Musculoskeletal: Negative.   Skin: Negative.   Neurological: Negative.   Psychiatric/Behavioral: Negative.      Objective:  Physical Exam Constitutional:      Appearance: She is well-developed. She is obese.  HENT:     Head: Normocephalic and atraumatic.  Cardiovascular:     Rate and Rhythm: Normal rate and regular rhythm.  Pulmonary:     Effort: Pulmonary effort is normal. No respiratory distress.     Breath sounds: Normal breath sounds. No wheezing or rales.  Abdominal:     General: Bowel sounds are normal. There is no distension.     Palpations: Abdomen is soft.     Tenderness: There is no abdominal tenderness. There is no rebound.  Musculoskeletal:     Cervical back: Normal range of motion.  Skin:    General: Skin is warm and dry.  Neurological:     Mental Status: She is alert and oriented to person, place, and time.     Coordination: Coordination normal.     Vitals:   05/17/22 1051  BP: 124/74  Pulse: 88  Resp: 18  SpO2: 99%  Weight: 257 lb (116.6 kg)  Height: '5\' 7"'$  (1.702 m)    Assessment & Plan:

## 2022-05-17 NOTE — Assessment & Plan Note (Signed)
Prior colposcopy done and repeat was recommended for 2022 and not done. Referral to ob/gyn as she has had ongoing abnormal pap smear. She requests STD screening today so we have done HIV, GC/chlamydia.

## 2022-05-20 LAB — HIV ANTIBODY (ROUTINE TESTING W REFLEX): HIV 1&2 Ab, 4th Generation: NONREACTIVE

## 2022-05-22 LAB — GC/CHLAMYDIA PROBE AMP
Chlamydia trachomatis, NAA: NEGATIVE
Neisseria Gonorrhoeae by PCR: NEGATIVE

## 2022-05-24 ENCOUNTER — Other Ambulatory Visit: Payer: Self-pay | Admitting: Internal Medicine

## 2022-05-24 DIAGNOSIS — R7303 Prediabetes: Secondary | ICD-10-CM

## 2022-07-24 ENCOUNTER — Ambulatory Visit: Payer: Medicaid Other | Admitting: Obstetrics and Gynecology

## 2022-07-29 ENCOUNTER — Ambulatory Visit: Payer: Medicaid Other | Admitting: Registered"

## 2022-07-29 NOTE — Progress Notes (Deleted)
Medical Nutrition Therapy  Appointment Start time:  ***  Appointment End time:  ***  Primary concerns today: ***  Referral diagnosis: R73.03 (ICD-10-CM) - Pre-diabetes Preferred learning style: no preference indicated Learning readiness: *** (not ready, contemplating, ready, change in progress)  NUTRITION ASSESSMENT  Anthropometrics  ***  Body Composition Scale Date 06/03/2022  Current Body Weight   Total Body Fat %   Visceral Fat   Healthy = 1-9   High = 10-14   Very High = 15+   Fat-Free Mass %    Total Body Water %   Muscle-Mass lbs   BMI   Body Fat Displacement          Torso  lbs          Left Leg  lbs          Right Leg  lbs          Left Arm  lbs          Right Arm   lbs    Clinical Medical Hx: prediabetes, GDM Medications: none Labs: A1c 6.5% on 05/17/22 Notable Signs/Symptoms: ***  Lifestyle & Dietary Hx ***  Estimated daily fluid intake: *** oz Supplements: *** Sleep: *** Stress / self-care: *** Current average weekly physical activity: ***  24-Hr Dietary Recall First Meal: *** Snack: *** Second Meal: *** Snack: *** Third Meal: *** Snack: *** Beverages: ***  NUTRITION DIAGNOSIS  {CHL AMB NUTRITIONAL DIAGNOSIS:[id number]}  NUTRITION INTERVENTION  Nutrition education (E-1) on the following topics:  ***  Handouts Provided Include  ***  Learning Style & Readiness for Change Teaching method utilized: Visual & Auditory  Demonstrated degree of understanding via: Teach Back  Barriers to learning/adherence to lifestyle change: none  Goals Established by Pt ***   MONITORING & EVALUATION Dietary intake, weekly physical activity, and *** in ***.

## 2023-07-31 ENCOUNTER — Ambulatory Visit: Payer: PRIVATE HEALTH INSURANCE | Admitting: Internal Medicine

## 2023-08-15 ENCOUNTER — Ambulatory Visit: Payer: PRIVATE HEALTH INSURANCE | Admitting: Internal Medicine

## 2023-08-25 ENCOUNTER — Ambulatory Visit (INDEPENDENT_AMBULATORY_CARE_PROVIDER_SITE_OTHER): Payer: Medicaid Other | Admitting: Internal Medicine

## 2023-08-25 ENCOUNTER — Telehealth: Payer: Self-pay | Admitting: Internal Medicine

## 2023-08-25 VITALS — BP 122/80 | HR 76 | Temp 98.4°F | Ht 67.0 in | Wt 250.0 lb

## 2023-08-25 DIAGNOSIS — Z8632 Personal history of gestational diabetes: Secondary | ICD-10-CM

## 2023-08-25 DIAGNOSIS — N644 Mastodynia: Secondary | ICD-10-CM | POA: Diagnosis not present

## 2023-08-25 DIAGNOSIS — D573 Sickle-cell trait: Secondary | ICD-10-CM

## 2023-08-25 DIAGNOSIS — E669 Obesity, unspecified: Secondary | ICD-10-CM

## 2023-08-25 DIAGNOSIS — Z0000 Encounter for general adult medical examination without abnormal findings: Secondary | ICD-10-CM

## 2023-08-25 DIAGNOSIS — N879 Dysplasia of cervix uteri, unspecified: Secondary | ICD-10-CM

## 2023-08-25 DIAGNOSIS — Z0001 Encounter for general adult medical examination with abnormal findings: Secondary | ICD-10-CM

## 2023-08-25 LAB — COMPREHENSIVE METABOLIC PANEL
ALT: 9 U/L (ref 0–35)
AST: 11 U/L (ref 0–37)
Albumin: 4.1 g/dL (ref 3.5–5.2)
Alkaline Phosphatase: 60 U/L (ref 39–117)
BUN: 12 mg/dL (ref 6–23)
CO2: 23 meq/L (ref 19–32)
Calcium: 8.8 mg/dL (ref 8.4–10.5)
Chloride: 107 meq/L (ref 96–112)
Creatinine, Ser: 0.79 mg/dL (ref 0.40–1.20)
GFR: 97.84 mL/min (ref >60.00)
Glucose, Bld: 105 mg/dL — ABNORMAL HIGH (ref 70–99)
Potassium: 3.9 meq/L (ref 3.5–5.1)
Sodium: 137 meq/L (ref 135–145)
Total Bilirubin: 0.2 mg/dL (ref 0.2–1.2)
Total Protein: 7.2 g/dL (ref 6.0–8.3)

## 2023-08-25 LAB — LIPID PANEL
Cholesterol: 199 mg/dL (ref 0–200)
HDL: 41.3 mg/dL (ref >39.00)
LDL Cholesterol: 138 mg/dL — ABNORMAL HIGH (ref 0–99)
NonHDL: 158.08
Total CHOL/HDL Ratio: 5
Triglycerides: 102 mg/dL (ref 0.0–149.0)
VLDL: 20.4 mg/dL (ref 0.0–40.0)

## 2023-08-25 LAB — HEMOGLOBIN A1C: Hgb A1c MFr Bld: 6.8 % — ABNORMAL HIGH (ref 4.6–6.5)

## 2023-08-25 LAB — CBC
HCT: 34.5 % — ABNORMAL LOW (ref 36.0–46.0)
Hemoglobin: 11.4 g/dL — ABNORMAL LOW (ref 12.0–15.0)
MCHC: 33 g/dL (ref 30.0–36.0)
MCV: 81.5 fL (ref 78.0–100.0)
Platelets: 377 10*3/uL (ref 150.0–400.0)
RBC: 4.24 Mil/uL (ref 3.87–5.11)
RDW: 14.7 % (ref 11.5–15.5)
WBC: 10.4 10*3/uL (ref 4.0–10.5)

## 2023-08-25 NOTE — Assessment & Plan Note (Signed)
Flu shot declines. Tetanus up to date. Mammogram ordered mom with early diagnosis, pap smear due with gyn. Counseled about sun safety and mole surveillance. Counseled about the dangers of distracted driving. Given 10 year screening recommendations.

## 2023-08-25 NOTE — Assessment & Plan Note (Signed)
Checking Hga1c and adjust as needed. We discussed metformin for prevention and weight loss as an option.

## 2023-08-25 NOTE — Telephone Encounter (Signed)
Pt mammogram referral needs to be reworded it is stating pt is having pain in breast and pt is not having pain in her breast she wanted to get it  done because of family history.please advise

## 2023-08-25 NOTE — Assessment & Plan Note (Signed)
Does have pre-diabetes no other complication. Discussed metformin for weight loss.

## 2023-08-25 NOTE — Assessment & Plan Note (Signed)
Overdue for pap and multiple biopsies in past. Needs to see ob/gyn she will call and schedule.

## 2023-08-25 NOTE — Progress Notes (Signed)
   Subjective:   Patient ID: Brandi Andrews, female    DOB: November 11, 1990, 32 y.o.   MRN: [id number]  HPI The patient is here for physical.  PMH, [facility], social history reviewed and updated  Review of Systems  Constitutional: Negative.   HENT: Negative.    Eyes: Negative.   Respiratory:  Negative for cough, chest tightness and shortness of breath.   Cardiovascular:  Negative for chest pain, palpitations and leg swelling.  Gastrointestinal:  Negative for abdominal distention, abdominal pain, constipation, diarrhea, nausea and vomiting.  Musculoskeletal: Negative.   Skin: Negative.   Neurological: Negative.   Psychiatric/Behavioral: Negative.      Objective:  Physical Exam Constitutional:      Appearance: She is well-developed.  HENT:     Head: Normocephalic and atraumatic.  Cardiovascular:     Rate and Rhythm: Normal rate and regular rhythm.  Pulmonary:     Effort: Pulmonary effort is normal. No respiratory distress.     Breath sounds: Normal breath sounds. No wheezing or rales.  Abdominal:     General: Bowel sounds are normal. There is no distension.     Palpations: Abdomen is soft.     Tenderness: There is no abdominal tenderness. There is no rebound.  Musculoskeletal:     Cervical back: Normal range of motion.  Skin:    General: Skin is warm and dry.  Neurological:     Mental Status: She is alert and oriented to person, place, and time.     Coordination: Coordination normal.     Vitals:   08/25/23 0852  BP: 122/80  Pulse: 76  Temp: 98.4 F (36.9 C)  TempSrc: Oral  SpO2: 97%  Weight: 250 lb (113.4 kg)  Height: 5\' 7"  (1.702 m)    Assessment & Plan:

## 2023-08-25 NOTE — Assessment & Plan Note (Signed)
No treatment required. Checking CBC.

## 2023-08-26 LAB — HIV ANTIBODY (ROUTINE TESTING W REFLEX): HIV 1&2 Ab, 4th Generation: NONREACTIVE

## 2023-08-26 LAB — RPR: RPR Ser Ql: NONREACTIVE

## 2023-08-26 NOTE — Telephone Encounter (Signed)
She is too young for screening mammogram (without 2+ family members 36 is screening age) so a diagnostic mammogram would be needed which is what was ordered.

## 2023-08-27 ENCOUNTER — Encounter: Payer: Self-pay | Admitting: Internal Medicine

## 2023-08-28 ENCOUNTER — Other Ambulatory Visit: Payer: Self-pay | Admitting: Internal Medicine

## 2023-08-28 LAB — GC/CHLAMYDIA PROBE AMP
Chlamydia trachomatis, NAA: NEGATIVE
Neisseria Gonorrhoeae by PCR: NEGATIVE

## 2023-08-28 MED ORDER — PRAVASTATIN SODIUM 20 MG PO TABS: 20.0000 mg | ORAL_TABLET | Freq: Every day | ORAL | 3 refills | Status: DC

## 2023-08-28 MED ORDER — METFORMIN HCL 500 MG PO TABS: 500.0000 mg | ORAL_TABLET | Freq: Every day | ORAL | 3 refills | Status: DC

## 2023-08-28 NOTE — Telephone Encounter (Signed)
Informed patient about this and she said she will give them a call again due to them not being able to schedule her

## 2023-09-01 ENCOUNTER — Other Ambulatory Visit: Payer: Self-pay | Admitting: Internal Medicine

## 2023-09-01 DIAGNOSIS — N644 Mastodynia: Secondary | ICD-10-CM

## 2023-10-07 ENCOUNTER — Ambulatory Visit: Payer: Medicaid Other | Admitting: Podiatry

## 2023-10-08 ENCOUNTER — Ambulatory Visit
Admission: RE | Admit: 2023-10-08 | Discharge: 2023-10-08 | Disposition: A | Discharge status: Home / Self Care | Payer: Medicaid Other | Source: Ambulatory Visit | Attending: Internal Medicine | Admitting: Internal Medicine

## 2023-10-08 ENCOUNTER — Ambulatory Visit: Payer: PRIVATE HEALTH INSURANCE

## 2023-10-08 DIAGNOSIS — N644 Mastodynia: Secondary | ICD-10-CM

## 2023-10-09 LAB — HM MAMMOGRAPHY

## 2023-10-10 ENCOUNTER — Encounter: Payer: Self-pay | Admitting: Internal Medicine

## 2024-03-16 ENCOUNTER — Ambulatory Visit (HOSPITAL_COMMUNITY)
Admission: RE | Admit: 2024-03-16 | Discharge: 2024-03-16 | Disposition: A | Discharge status: Home / Self Care | Payer: Self-pay | Source: Ambulatory Visit | Attending: Nurse Practitioner | Admitting: Nurse Practitioner

## 2024-03-16 ENCOUNTER — Other Ambulatory Visit: Payer: Self-pay

## 2024-03-16 ENCOUNTER — Encounter (HOSPITAL_COMMUNITY): Payer: Self-pay

## 2024-03-16 VITALS — BP 110/75 | HR 76 | Temp 98.5°F | Resp 20

## 2024-03-16 DIAGNOSIS — N760 Acute vaginitis: Secondary | ICD-10-CM | POA: Diagnosis present

## 2024-03-16 DIAGNOSIS — R300 Dysuria: Secondary | ICD-10-CM

## 2024-03-16 LAB — POCT URINALYSIS DIP (MANUAL ENTRY)
Bilirubin, UA: NEGATIVE
Glucose, UA: NEGATIVE mg/dL
Ketones, POC UA: NEGATIVE mg/dL
Nitrite, UA: NEGATIVE
Protein Ur, POC: NEGATIVE mg/dL
Spec Grav, UA: 1.025 (ref 1.010–1.025)
Urobilinogen, UA: 0.2 U/dL
pH, UA: 5.5 (ref 5.0–8.0)

## 2024-03-16 LAB — POCT URINE PREGNANCY: Preg Test, Ur: NEGATIVE

## 2024-03-16 MED ORDER — NITROFURANTOIN MONOHYD MACRO 100 MG PO CAPS: 100.0000 mg | ORAL_CAPSULE | Freq: Two times a day (BID) | ORAL | 0 refills | Status: DC

## 2024-03-16 MED ORDER — FLUCONAZOLE 150 MG PO TABS: 150.0000 mg | ORAL_TABLET | ORAL | 0 refills | Status: AC

## 2024-03-16 NOTE — ED Provider Notes (Signed)
 [facility]    CSN: [id number] Arrival date & time: 03/16/24  1723      History   Chief Complaint Chief Complaint  Patient presents with   Appointment    6:00   Vaginal Itching    HPI Brandi Andrews is a 33 y.o. female.   Brandi Andrews is a 33 y.o. female that presents with vaginal itching, odor, and irritation that started on Sunday. She reports overall discomfort in the genital area but denies burning or pain specifically when urinating. The patient initially thought she might have a urinary tract infection due to the itching "at the top," but decided to seek medical attention when the symptoms persisted. The patient denies any vaginal discharge, genital sores, back pain, or fevers. She is sexually active with one female condom and uses condoms "sometimes." LMP was around the 23rd of last month. She is not on birth control.   The following portions of the patient\'s history were reviewed and updated as appropriate: allergies, current medications, past family history, past medical history, past social history, past surgical history, and problem list.      Past Medical History:  Diagnosis Date   Gestational diabetes    prescribed metformin but not taking it   Medical history non-contributory    Obesity    Ovarian cyst     Patient Active Problem List   Diagnosis Date Noted   Encounter for general adult medical examination with abnormal findings 05/17/2022   Personal history of gestational diabetes 05/17/2022   Sickle cell trait (HCC) 05/05/2019   Cervical dysplasia 03/04/2017   Obesity (BMI 35.0-39.9 without comorbidity) 02/26/2017    Past Surgical History:  Procedure Laterality Date   EYE SURGERY     LAPAROSCOPIC SALPINGO OOPHERECTOMY Right 03/03/2018   Procedure: LAPAROSCOPIC SALPINGO OOPHORECTOMY;  Surgeon: Anyanwu, Ugonna A, MD;  Location: [facility];  Service: Gynecology;  Laterality: Right;    OB History     Gravida  2   Para  1   Term  1    Preterm      AB  1   Living  1      SAB  1   IAB      Ectopic      Multiple  0   Live Births  1        Obstetric Comments  G1: 2012 early SAB          Home Medications    Prior to Admission medications   Medication Sig Start Date End Date Taking? Authorizing Provider  fluconazole (DIFLUCAN) 150 MG tablet Take 1 tablet (150 mg total) by mouth every 3 (three) days for 2 doses. 03/16/24 03/20/24 Yes Fatiha Guzy, FNP  nitrofurantoin, macrocrystal-monohydrate, (MACROBID) 100 MG capsule Take 1 capsule (100 mg total) by mouth 2 (two) times daily. 03/16/24  Yes Shaniece Bussa, FNP  metFORMIN (GLUCOPHAGE) 500 MG tablet Take 1 tablet (500 mg total) by mouth daily with breakfast. 08/28/23   Crawford, Elizabeth A, MD  pravastatin (PRAVACHOL) 20 MG tablet Take 1 tablet (20 mg total) by mouth daily. 08/28/23   Crawford, Elizabeth A, MD    Family History Family History  Problem Relation Age of Onset   Diabetes Mother    Breast cancer Mother 55   Kidney failure Mother    Hypertension Father    Breast cancer Sister        30 's    Social History Social History   Tobacco Use   Smoking  status: Some Days    Current packs/day: 0.10    Average packs/day: 0.1 packs/day for 4.0 years (0.4 ttl pk-yrs)    Types: Cigarettes   Smokeless tobacco: Never  Vaping Use   Vaping status: Never Used  Substance Use Topics   Alcohol use: Not Currently   Drug use: No     Allergies   Mushroom extract complex (obsolete) and Penicillins   Review of Systems Review of Systems  Constitutional:  Negative for fever.  Gastrointestinal:  Negative for abdominal pain.  Genitourinary:  Positive for dysuria. Negative for genital sores and vaginal discharge.  Musculoskeletal:  Negative for back pain.  All other systems reviewed and are negative.    Physical Exam Triage Vital Signs ED Triage Vitals  Encounter Vitals Group     BP 03/16/24 1819 110/75     Systolic BP Percentile --       Diastolic BP Percentile --      Pulse Rate 03/16/24 1819 76     Resp 03/16/24 1819 20     Temp 03/16/24 1819 98.5 F (36.9 C)     Temp Source 03/16/24 1819 Oral     SpO2 03/16/24 1819 98 %     Weight --      Height --      Head Circumference --      Peak Flow --      Pain Score 03/16/24 1817 2     Pain Loc --      Pain Education --      Exclude from Growth Chart --    No data found.  Updated Vital Signs BP 110/75 (BP Location: Right Arm)   Pulse 76   Temp 98.5 F (36.9 C) (Oral)   Resp 20   LMP 02/21/2024   SpO2 98%   Visual Acuity Right Eye Distance:   Left Eye Distance:   Bilateral Distance:    Right Eye Near:   Left Eye Near:    Bilateral Near:     Physical Exam Constitutional:      General: She is not in acute distress.    Appearance: Normal appearance. She is not ill-appearing, toxic-appearing or diaphoretic.  HENT:     Head: Normocephalic.     Nose: Nose normal.     Mouth/Throat:     Mouth: Mucous membranes are moist.  Eyes:     Conjunctiva/sclera: Conjunctivae normal.  Cardiovascular:     Rate and Rhythm: Normal rate.  Pulmonary:     Effort: Pulmonary effort is normal.  Abdominal:     Palpations: Abdomen is soft.  Genitourinary:    Comments: Deferred; patient performed self-swab for Aptima testing  Musculoskeletal:        General: Normal range of motion.     Cervical back: Normal range of motion and neck supple.  Skin:    General: Skin is warm and dry.  Neurological:     General: No focal deficit present.     Mental Status: She is alert and oriented to person, place, and time.  Psychiatric:        Mood and Affect: Mood normal.        Behavior: Behavior normal.      UC Treatments / Results  Labs (all labs ordered are listed, but only abnormal results are displayed) Labs Reviewed  POCT URINALYSIS DIP (MANUAL ENTRY) - Abnormal; Notable for the following components:      Result Value   Clarity, UA cloudy (*)    Blood,  UA trace-lysed  (*)    Leukocytes, UA Small (1+) (*)    All other components within normal limits  POCT URINE PREGNANCY - Normal  URINE CULTURE  CERVICOVAGINAL ANCILLARY ONLY    EKG   Radiology No results found.  Procedures Procedures (including critical care time)  Medications Ordered in UC Medications - No data to display  Initial Impression / Assessment and Plan / UC Course  I have reviewed the triage vital signs and the nursing notes.  Pertinent labs & imaging results that were available during my care of the patient were reviewed by me and considered in my medical decision making (see chart for details).    The patient is a 34 year old female presenting with vaginal itching, odor, and irritation since Sunday. She denies discharge, genital sores, or back pain. A urine pregnancy test is negative. Urinalysis shows cloudy urine with a small amount of white blood cells, which may indicate a urinary tract infection, though her symptoms are more consistent with overall vaginal discomfort rather than classic urinary symptoms. Given the presence of itching and irritation, yeast or bacterial vaginitis is also considered. GU exam was deferred and the patient completed a self-swab for Aptima testing. She was prescribed Macrobid twice daily for 5 days to treat a possible UTI and Diflucan  for suspected yeast infection, with a second dose to be taken in 3 days. Urine was sent for culture to confirm infection and guide further treatment. Vaginal swab was sent for evaluation of bacterial vaginosis, yeast, gonorrhea, chlamydia, and trichomoniasis. Treatment will be adjusted based on pending test results.  Today's evaluation has revealed no signs of a dangerous process. Discussed diagnosis with patient and/or guardian. Patient and/or guardian aware of their diagnosis, possible red flag symptoms to watch out for and need for close follow up. Patient and/or guardian understands verbal and written discharge instructions.  Patient and/or guardian comfortable with plan and disposition.  Patient and/or guardian has a clear mental status at this time, good insight into illness (after discussion and teaching) and has clear judgment to make decisions regarding their care  Documentation was completed with the aid of voice recognition software. Transcription may contain typographical errors. Final Clinical Impressions(s) / UC Diagnoses   Final diagnoses:  Vaginitis and vulvovaginitis  Dysuria     Discharge Instructions      Testing for bacteria, yeast, gonorrhea, chlamydia, and trichomonas is currently pending. It is important that you avoid any sexual activity until your test results have returned, your treatment is complete, and your symptoms have fully resolved.  Because you are experiencing symptoms today, you have been prescribed oral antibiotics to treat a possible bacterial infection and an antifungal medication for possible yeast. It is very important to take all of the medication exactly as prescribed, even if you begin to feel better before finishing the course.  You will only be contacted if any of your test results come back positive. However, you can view your results at any time by logging into your MyChart account.   ED Prescriptions     Medication Sig Dispense Auth. Provider   nitrofurantoin, macrocrystal-monohydrate, (MACROBID) 100 MG capsule Take 1 capsule (100 mg total) by mouth 2 (two) times daily. 10 capsule Maryruth Sol, FNP   fluconazole  (DIFLUCAN ) 150 MG tablet Take 1 tablet (150 mg total) by mouth every 3 (three) days for 2 doses. 2 tablet Maryruth Sol, FNP      PDMP not reviewed this encounter.   Maryruth Sol, [place] 03/16/24 [phone]

## 2024-03-16 NOTE — ED Triage Notes (Signed)
 Sunday was the onset of vaginal itching.  No vaginal discharge.  Odor is different from her norm.    Pain started yesterday, irritating pain.  Denies abdominal pain or back pain  Has not had any medications for symptoms

## 2024-03-16 NOTE — Discharge Instructions (Addendum)
 Testing for bacteria, yeast, gonorrhea, chlamydia, and trichomonas is currently pending. It is important that you avoid any sexual activity until your test results have returned, your treatment is complete, and your symptoms have fully resolved.  Because you are experiencing symptoms today, you have been prescribed oral antibiotics to treat a possible bacterial infection and an antifungal medication for possible yeast. It is very important to take all of the medication exactly as prescribed, even if you begin to feel better before finishing the course.  You will only be contacted if any of your test results come back positive. However, you can view your results at any time by logging into your MyChart account.

## 2024-03-17 ENCOUNTER — Ambulatory Visit: Payer: Self-pay

## 2024-03-17 ENCOUNTER — Ambulatory Visit (HOSPITAL_COMMUNITY): Payer: Self-pay

## 2024-03-17 LAB — CERVICOVAGINAL ANCILLARY ONLY
Bacterial Vaginitis (gardnerella): POSITIVE — AB
Candida Glabrata: NEGATIVE
Candida Vaginitis: POSITIVE — AB
Chlamydia: NEGATIVE
Comment: NEGATIVE
Comment: NEGATIVE
Comment: NEGATIVE
Comment: NEGATIVE
Comment: NEGATIVE
Comment: NORMAL
Neisseria Gonorrhea: NEGATIVE
Trichomonas: NEGATIVE

## 2024-03-18 ENCOUNTER — Ambulatory Visit (HOSPITAL_COMMUNITY): Payer: Self-pay

## 2024-03-18 LAB — URINE CULTURE

## 2024-03-18 MED ORDER — METRONIDAZOLE 500 MG PO TABS: 500.0000 mg | ORAL_TABLET | Freq: Two times a day (BID) | ORAL | 0 refills | Status: DC

## 2024-04-03 ENCOUNTER — Ambulatory Visit (HOSPITAL_COMMUNITY): Payer: Self-pay

## 2024-04-03 ENCOUNTER — Other Ambulatory Visit: Payer: Self-pay

## 2024-04-03 ENCOUNTER — Emergency Department (HOSPITAL_COMMUNITY)
Admission: EM | Admit: 2024-04-03 | Discharge: 2024-04-03 | Disposition: A | Discharge status: Home / Self Care | Attending: Emergency Medicine | Admitting: Emergency Medicine

## 2024-04-03 ENCOUNTER — Encounter (HOSPITAL_COMMUNITY): Payer: Self-pay | Admitting: *Deleted

## 2024-04-03 DIAGNOSIS — H6691 Otitis media, unspecified, right ear: Secondary | ICD-10-CM | POA: Insufficient documentation

## 2024-04-03 DIAGNOSIS — H6690 Otitis media, unspecified, unspecified ear: Secondary | ICD-10-CM

## 2024-04-03 DIAGNOSIS — J029 Acute pharyngitis, unspecified: Secondary | ICD-10-CM | POA: Insufficient documentation

## 2024-04-03 DIAGNOSIS — B9789 Other viral agents as the cause of diseases classified elsewhere: Secondary | ICD-10-CM | POA: Insufficient documentation

## 2024-04-03 LAB — GROUP A STREP BY PCR: Group A Strep by PCR: NOT DETECTED

## 2024-04-03 MED ORDER — ACETAMINOPHEN 325 MG PO TABS
650.0000 mg | ORAL_TABLET | Freq: Once | ORAL | Status: AC
  Administered 2024-04-03: 650 mg via ORAL
  Filled 2024-04-03: qty 2

## 2024-04-03 MED ORDER — CEFDINIR 300 MG PO CAPS: 300.0000 mg | ORAL_CAPSULE | Freq: Two times a day (BID) | ORAL | 0 refills | Status: DC

## 2024-04-03 NOTE — Discharge Instructions (Signed)
 Please use Tylenol  or ibuprofen  for pain, fever.  You may use 600 mg ibuprofen  every 6 hours or 1000 mg of Tylenol  every 6 hours.  You may choose to alternate between the 2.  This would be most effective.  Not to exceed 4 g of Tylenol  within 24 hours.  Not to exceed 3200 mg ibuprofen  24 hours.  You can take the course of antibiotics that prescribed if your symptoms fail to improve after 3 days of conservative measures.  Drink plenty of fluids,

## 2024-04-03 NOTE — ED Triage Notes (Signed)
 The pt has had a sore throat for 2 days with bi-lateral earache  lmp last month

## 2024-04-03 NOTE — ED Provider Notes (Signed)
 [place] [facility] Provider Note   CSN: [id number] Arrival date & time: 04/03/24  1657     History  Chief Complaint  Patient presents with   Sore Throat    Brandi Andrews is a 33 y.o. female with overall noncontributory past medical history presents with concern for sore throat for the last 3 days, bilateral earache, right greater than left.  COVID test at home was negative.  She does endorse some fever, chills at home.  Reports that her child had an ear infection, no other recent sick contacts.  Denies any nausea, vomiting, shortness of breath, chest pain.   Sore Throat       Home Medications Prior to Admission medications   Medication Sig Start Date End Date Taking? Authorizing Provider  cefdinir (OMNICEF) 300 MG capsule Take 1 capsule (300 mg total) by mouth 2 (two) times daily. 04/03/24  Yes Atalie Oros H, PA-C  metFORMIN  (GLUCOPHAGE ) 500 MG tablet Take 1 tablet (500 mg total) by mouth daily with breakfast. 08/28/23   Adelia Homestead, MD  metroNIDAZOLE  (FLAGYL ) 500 MG tablet Take 1 tablet (500 mg total) by mouth 2 (two) times daily. 03/18/24   Ann Keto, MD  nitrofurantoin , macrocrystal-monohydrate, (MACROBID ) 100 MG capsule Take 1 capsule (100 mg total) by mouth 2 (two) times daily. 03/16/24   Murrill, Samantha, FNP  pravastatin  (PRAVACHOL ) 20 MG tablet Take 1 tablet (20 mg total) by mouth daily. 08/28/23   Adelia Homestead, MD      Allergies    Mushroom extract complex (obsolete) and Penicillins    Review of Systems   Review of Systems  All other systems reviewed and are negative.   Physical Exam Updated Vital Signs BP 133/80   Pulse (!) 105   Temp 99.6 F (37.6 C)   Resp 18   Ht 5\' 7"  (1.702 m)   Wt 113.4 kg   LMP 02/21/2024   SpO2 100%   BMI 39.16 kg/m  Physical Exam Vitals and nursing note reviewed.  Constitutional:      General: She is not in acute distress.    Appearance: Normal  appearance.  HENT:     Head: Normocephalic and atraumatic.     Right Ear: No middle ear effusion. Tympanic membrane is erythematous.     Left Ear: Tympanic membrane normal.     Mouth/Throat:     Comments: Mild posterior oropharynx erythema, no significant tonsillar swelling, uvula midline, floor of mouth without swelling, exudate. Eyes:     General:        Right eye: No discharge.        Left eye: No discharge.  Cardiovascular:     Rate and Rhythm: Normal rate and regular rhythm.  Pulmonary:     Effort: Pulmonary effort is normal. No respiratory distress.  Musculoskeletal:        General: No deformity.  Skin:    General: Skin is warm and dry.  Neurological:     Mental Status: She is alert and oriented to person, place, and time.  Psychiatric:        Mood and Affect: Mood normal.        Behavior: Behavior normal.     ED Results / Procedures / Treatments   Labs (all labs ordered are listed, but only abnormal results are displayed) Labs Reviewed  GROUP A STREP BY PCR    EKG None  Radiology No results found.  Procedures Procedures  Medications Ordered in ED Medications  acetaminophen  (TYLENOL ) tablet 650 mg (has no administration in time range)    ED Course/ Medical Decision Making/ A&P                                 Medical Decision Making Risk OTC drugs.   This is a well-appearing 33yo female who presents with concern for 2-3 days of fever, sore throat, ear pain.  My emergent differential diagnosis includes acute upper respiratory infection with COVID, flu, RSV versus new asthma presentation, acute bronchitis, less clinical concern for pneumonia.  Also considered other ENT emergencies, Ludwig angina, strep pharyngitis, mono, versus epiglottis, tonsillitis versus other.  This is not an exhaustive differential.  On my exam patient is overall well-appearing, they have temperature of 99.6, breathing unlabored, no tachypnea, no respiratory distress, stable oxygen   saturation.  Patient with mild tachycardia.  Bilateral TMs are without effusion but there is some redness of the right TM.Aaron Aas  Strep PCR is negative.  Patient symptoms are consistent with suspected viral pharyngitis with concurrent viral otitis media, encouraged ibuprofen , Tylenol , rest, plenty of fluids.  Discharged with as needed cefdinir course but instructed not to take it unless symptoms fail to improve with conservative measures. discussed extensive return precautions.  Patient discharged in stable condition at this time.  Final Clinical Impression(s) / ED Diagnoses Final diagnoses:  Viral pharyngitis  Acute otitis media, unspecified otitis media type    Rx / DC Orders ED Discharge Orders          Ordered    cefdinir (OMNICEF) 300 MG capsule  2 times daily        04/03/24 1842              Dion Sibal, [place] H, PA-C 04/03/24 1842    Nolberto Batty, DO 04/03/24 2257

## 2024-04-03 NOTE — ED Notes (Signed)
 Discharge instructions reviewed with patient. Patient questions answered and opportunity for education reviewed. Patient voices understanding of discharge instructions with no further questions. Patient ambulatory with steady gait to lobby.

## 2024-04-03 NOTE — ED Triage Notes (Signed)
 Last tylenol  this afternoon  she did a home covid that was negative  she was sent from this urgent care and told they were  too ful and she would have to wait

## 2024-05-24 ENCOUNTER — Encounter: Payer: PRIVATE HEALTH INSURANCE | Admitting: Obstetrics and Gynecology

## 2024-05-24 ENCOUNTER — Ambulatory Visit: Admitting: Internal Medicine

## 2024-09-27 ENCOUNTER — Other Ambulatory Visit: Payer: Self-pay | Admitting: Internal Medicine

## 2024-09-27 DIAGNOSIS — Z1231 Encounter for screening mammogram for malignant neoplasm of breast: Secondary | ICD-10-CM

## 2024-10-26 ENCOUNTER — Ambulatory Visit
Admission: RE | Admit: 2024-10-26 | Discharge: 2024-10-26 | Disposition: A | Discharge status: Home / Self Care | Source: Ambulatory Visit | Attending: Internal Medicine | Admitting: Internal Medicine

## 2024-10-26 DIAGNOSIS — Z1231 Encounter for screening mammogram for malignant neoplasm of breast: Secondary | ICD-10-CM

## 2024-10-29 ENCOUNTER — Ambulatory Visit: Payer: Self-pay | Admitting: Internal Medicine

## 2024-11-12 ENCOUNTER — Ambulatory Visit: Admitting: Internal Medicine

## 2024-11-12 ENCOUNTER — Encounter: Payer: Self-pay | Admitting: Internal Medicine

## 2024-11-12 VITALS — BP 102/60 | HR 79 | Temp 98.3°F | Ht 67.0 in | Wt 252.8 lb

## 2024-11-12 DIAGNOSIS — R7303 Prediabetes: Secondary | ICD-10-CM | POA: Diagnosis not present

## 2024-11-12 DIAGNOSIS — Z6839 Body mass index (BMI) 39.0-39.9, adult: Secondary | ICD-10-CM

## 2024-11-12 DIAGNOSIS — Z0000 Encounter for general adult medical examination without abnormal findings: Secondary | ICD-10-CM

## 2024-11-12 DIAGNOSIS — Z0001 Encounter for general adult medical examination with abnormal findings: Secondary | ICD-10-CM

## 2024-11-12 LAB — LIPID PANEL
Cholesterol: 233 mg/dL — ABNORMAL HIGH (ref 28–200)
HDL: 45.6 mg/dL
LDL Cholesterol: 155 mg/dL — ABNORMAL HIGH (ref 10–99)
NonHDL: 187.04
Total CHOL/HDL Ratio: 5
Triglycerides: 161 mg/dL — ABNORMAL HIGH (ref 10.0–149.0)
VLDL: 32.2 mg/dL (ref 0.0–40.0)

## 2024-11-12 LAB — COMPREHENSIVE METABOLIC PANEL WITH GFR
ALT: 9 U/L (ref 3–35)
AST: 12 U/L (ref 5–37)
Albumin: 4.1 g/dL (ref 3.5–5.2)
Alkaline Phosphatase: 59 U/L (ref 39–117)
BUN: 9 mg/dL (ref 6–23)
CO2: 28 meq/L (ref 19–32)
Calcium: 8.8 mg/dL (ref 8.4–10.5)
Chloride: 107 meq/L (ref 96–112)
Creatinine, Ser: 0.69 mg/dL (ref 0.40–1.20)
GFR: 112.55 mL/min
Glucose, Bld: 137 mg/dL — ABNORMAL HIGH (ref 70–99)
Potassium: 4 meq/L (ref 3.5–5.1)
Sodium: 140 meq/L (ref 135–145)
Total Bilirubin: 0.3 mg/dL (ref 0.2–1.2)
Total Protein: 7.3 g/dL (ref 6.0–8.3)

## 2024-11-12 LAB — CBC
HCT: 34.5 % — ABNORMAL LOW (ref 36.0–46.0)
Hemoglobin: 11.6 g/dL — ABNORMAL LOW (ref 12.0–15.0)
MCHC: 33.7 g/dL (ref 30.0–36.0)
MCV: 82.3 fl (ref 78.0–100.0)
Platelets: 361 K/uL (ref 150.0–400.0)
RBC: 4.19 Mil/uL (ref 3.87–5.11)
RDW: 14.7 % (ref 11.5–15.5)
WBC: 8.5 K/uL (ref 4.0–10.5)

## 2024-11-12 LAB — MICROALBUMIN / CREATININE URINE RATIO
Creatinine,U: 134.4 mg/dL
Microalb Creat Ratio: 6.2 mg/g (ref 0.0–30.0)
Microalb, Ur: 0.8 mg/dL (ref 0.7–1.9)

## 2024-11-12 LAB — HEMOGLOBIN A1C: Hgb A1c MFr Bld: 7.2 % — ABNORMAL HIGH (ref 4.6–6.5)

## 2024-11-12 NOTE — Assessment & Plan Note (Signed)
 BMI 39 and complicated by pre-diabetes.

## 2024-11-12 NOTE — Assessment & Plan Note (Signed)
 Prior readings not consistent and checking HgA1c. If 6.5 or greater would be new diabetes. Checking UACR, lipid panel and CMP and Hga1c. Foot exam done.

## 2024-11-12 NOTE — Assessment & Plan Note (Signed)
 Flu shot up to date. Tetanus up to date, pap smear up to date with gyn. Counseled about sun safety and mole surveillance. Counseled about the dangers of distracted driving. Given 10 year screening recommendations.

## 2024-11-12 NOTE — Patient Instructions (Signed)
 j

## 2024-11-12 NOTE — Progress Notes (Signed)
" ° °  Subjective:   Patient ID: Brandi Andrews, female    DOB: 1991-06-08, 34 y.o.   MRN: [id number]  The patient is here for physical. Pertinent topics discussed: Discussed the use of AI scribe software for clinical note transcription with the patient, who gave verbal consent to proceed.  History of Present Illness Brandi Andrews is a 34 year old female who presents for sugar management and medication adjustment.  She is experiencing significant gastrointestinal side effects from metformin , including diarrhea and stomach problems, which led to its discontinuation. She is interested in exploring alternative medications that do not have these side effects.  She experiences occasional palpitations described as 'little flutters' in her chest but has no new chest pain or pressure. Her blood pressure remains normal.  She has been experiencing random headaches recently, which are relieved by over-the-counter medications. These headaches are not associated with her blood pressure, which is well-controlled.  No new breathing problems are reported. She acknowledges limited physical activity but tries to stay somewhat active by moving and getting steps in when possible.  PMH, [facility], social history reviewed and updated  Review of Systems  Constitutional: Negative.   HENT: Negative.    Eyes: Negative.   Respiratory:  Negative for cough, chest tightness and shortness of breath.   Cardiovascular:  Negative for chest pain, palpitations and leg swelling.  Gastrointestinal:  Negative for abdominal distention, abdominal pain, constipation, diarrhea, nausea and vomiting.  Musculoskeletal: Negative.   Skin: Negative.   Neurological:  Positive for headaches.  Psychiatric/Behavioral: Negative.      Objective:  Physical Exam Constitutional:      Appearance: She is well-developed.  HENT:     Head: Normocephalic and atraumatic.  Cardiovascular:     Rate and Rhythm: Normal rate and regular rhythm.   Pulmonary:     Effort: Pulmonary effort is normal. No respiratory distress.     Breath sounds: Normal breath sounds. No wheezing or rales.  Abdominal:     General: Bowel sounds are normal. There is no distension.     Palpations: Abdomen is soft.     Tenderness: There is no abdominal tenderness.  Musculoskeletal:     Cervical back: Normal range of motion.  Skin:    General: Skin is warm and dry.  Neurological:     Mental Status: She is alert and oriented to person, place, and time.     Coordination: Coordination normal.     Vitals:   11/12/24 1020  BP: 102/60  Pulse: 79  Temp: 98.3 F (36.8 C)  TempSrc: Oral  SpO2: 95%  Weight: 252 lb 12.8 oz (114.7 kg)  Height: 5' 7 (1.702 m)    Assessment & Plan:   "

## 2024-11-15 ENCOUNTER — Telehealth: Payer: Self-pay | Admitting: Internal Medicine

## 2024-11-15 ENCOUNTER — Ambulatory Visit: Payer: Self-pay | Admitting: Internal Medicine

## 2024-11-15 DIAGNOSIS — E119 Type 2 diabetes mellitus without complications: Secondary | ICD-10-CM | POA: Insufficient documentation

## 2024-11-15 NOTE — Progress Notes (Unsigned)
 Care Guide Pharmacy Note  11/15/2024 Name: Brandi Andrews MRN: [id number] DOB: 02-05-1991  Referred By: Rollene Almarie LABOR, MD Reason for referral: Call Attempt #1 and Complex Care Management (Outreach to sch ref w/ pharm)   Brandi Andrews is a 34 y.o. year old female who is a primary care patient of Rollene Almarie LABOR, MD.  Brandi Andrews was referred to the pharmacist for assistance related to: HLD and DMII  An unsuccessful telephone outreach was attempted today to contact the patient who was referred to the pharmacy team for assistance with medication management. Additional attempts will be made to contact the patient.  Brandi Andrews [facility], [facility] Guide Direct Dial: [phone]  Fax: [phone]

## 2024-11-16 ENCOUNTER — Telehealth: Payer: Self-pay

## 2024-11-16 NOTE — Telephone Encounter (Signed)
 Copied from CRM [id number]. Topic: Appointments - Scheduling Inquiry for Clinic >> Nov 16, 2024  9:51 AM Suzen RAMAN wrote: Reason for CRM: Patient called requesting to schedule an appointment with the pharmacist per Dr. Rollene. Please contact patient to schedule.    CB#[phone]

## 2024-11-17 ENCOUNTER — Other Ambulatory Visit: Payer: Self-pay

## 2024-11-17 MED ORDER — PRAVASTATIN SODIUM 20 MG PO TABS: 20.0000 mg | ORAL_TABLET | Freq: Every day | ORAL | 3 refills | Status: AC

## 2024-11-17 NOTE — Progress Notes (Signed)
 Care Guide Pharmacy Note  11/17/2024 Name: Brandi Andrews MRN: [id number] DOB: 21-Feb-1991  Referred By: Rollene Almarie LABOR, MD Reason for referral: Call Attempt #1, Complex Care Management (Outreach to sch ref w/ pharm), and Call Attempt #2   [name] is a 34 y.o. year old female who is a primary care patient of Rollene Almarie LABOR, MD.  [name] was referred to the pharmacist for assistance related to: DMII  Successful contact was made with the patient to discuss pharmacy services including being ready for the pharmacist to call at least 5 minutes before the scheduled appointment time and to have medication bottles and any blood pressure readings ready for review. The patient agreed to meet with the pharmacist via telephone visit on 12/01/2024.  Doyce Razor [facility], [facility] Guide Direct Dial: [phone]  Fax: [phone]

## 2024-12-01 ENCOUNTER — Telehealth: Payer: Self-pay

## 2024-12-01 ENCOUNTER — Other Ambulatory Visit (HOSPITAL_COMMUNITY): Payer: Self-pay

## 2024-12-01 ENCOUNTER — Other Ambulatory Visit: Admitting: Pharmacist

## 2024-12-01 DIAGNOSIS — E119 Type 2 diabetes mellitus without complications: Secondary | ICD-10-CM

## 2024-12-01 MED ORDER — OZEMPIC (0.25 OR 0.5 MG/DOSE) 2 MG/3ML ~~LOC~~ SOPN: PEN_INJECTOR | SUBCUTANEOUS | 0 refills | Status: AC

## 2024-12-01 NOTE — Telephone Encounter (Signed)
 Pharmacy Patient Advocate Encounter   Received notification from [facility] KEY that prior authorization for Semaglutide ,0.25 or 0.5MG /DOS, (OZEMPIC , 0.25 OR 0.5 MG/DOSE,) 2 MG/3ML SOPN  is required/requested.   Insurance verification completed.   The patient is insured through [facility] MEDICAID.   Per test claim: PA required; PA submitted to above mentioned insurance via Latent Key/confirmation #/EOC [id number] Status is pending

## 2024-12-01 NOTE — Patient Instructions (Signed)
 It was a pleasure speaking with you today!  Start Ozempic  0.25 mg weekly for 4 weeks then increase to 0.5 mg weekly.  Continue working on diet and exercise as we discussed.   I will follow up in 4 weeks to check how you are tolerating Ozempic .  Feel free to call with any questions or concerns!  Darrelyn Drum, PharmD, BCPS, CPP Clinical Pharmacist Practitioner [facility] [phone]

## 2024-12-01 NOTE — Progress Notes (Signed)
 "  12/01/2024 Name: Brandi Andrews MRN: [id number] DOB: Oct 01, 1991  Chief Complaint  Patient presents with   Diabetes   Medication Management    Brandi Andrews is a 34 y.o. year old female who presented for a telephone visit.   They were referred to the pharmacist by their PCP for assistance in managing diabetes.   Subjective:  Care Team: Primary Care Provider: Rollene Almarie LABOR, MD ; Next Scheduled Visit: none scheduled  Medication Access/Adherence  Current Pharmacy:  [id number] [place], [place] - [address] AT [facility] [address] [place] [address] Phone: [phone] Fax: [phone]   Patient reports affordability concerns with their medications: No  Patient reports access/transportation concerns to their pharmacy: No  Patient reports adherence concerns with their medications:  No     Diabetes:  Current medications: none Medications tried in the past: metformin  (GI upset, diarrhea)  Family history of T2DM (mother)  Current meal patterns:  - Pt notes she has been mindful of her diet for a long time and has had difficulty losing any weight despite her efforts  Current physical activity: moderate exercise  Current medication access support: Medicaid  Macrovascular and Microvascular Risk Reduction:  Statin? yes (pravastatin  - confirmed she restarted); ACEi/ARB? no; therapy not indicated  Last urinary albumin/creatinine ratio:  Lab Results  Component Value Date   MICRALBCREAT 6.2 11/12/2024   Last eye exam:   Last foot exam: No foot exam found Tobacco Use:  Tobacco Use: High Risk (11/12/2024)   Patient History    Smoking Tobacco Use: Some Days    Smokeless Tobacco Use: Never    Passive Exposure: Not on file     Objective:  Lab Results  Component Value Date   HGBA1C 7.2 (H) 11/12/2024    Lab Results  Component Value Date   CREATININE 0.69 11/12/2024   BUN 9 11/12/2024   NA 140 11/12/2024   K 4.0  11/12/2024   CL 107 11/12/2024   CO2 28 11/12/2024    Lab Results  Component Value Date   CHOL 233 (H) 11/12/2024   HDL 45.60 11/12/2024   LDLCALC 155 (H) 11/12/2024   TRIG 161.0 (H) 11/12/2024   CHOLHDL 5 11/12/2024    Medications Reviewed Today     Reviewed by Merceda Lela SAUNDERS, RPH-CPP (Pharmacist) on 12/01/24 at 1103  Med List Status: <None>   Medication Order Taking? Sig Documenting Provider Last Dose Status Informant  pravastatin  (PRAVACHOL ) 20 MG tablet [id number] Yes Take 1 tablet (20 mg total) by mouth daily. Rollene Almarie LABOR, MD  Active   Semaglutide ,0.25 or 0.5MG /DOS, (OZEMPIC , 0.25 OR 0.5 MG/DOSE,) 2 MG/3ML SOPN [id number]  Inject 0.25 mg SQ once weekly for 4 weeks then increase to 0.5 mg weekly Rollene Almarie LABOR, MD  Active               Assessment/Plan:   Diabetes: - Currently uncontrolled; goal A1c <7%. Cardiorenal risk reduction has opportunities for improvement.. Blood pressure is at goal <130/80. LDL is not at goal.  - Reviewed dietary modifications including low carb, high protein. and Reviewed lifestyle modifications including increased exercise, including strength training. - Recommend to start Ozempic  0.25 mg SQ weekly x 4 weeks then increase to 0.5 mg weekly . - Patient denies personal or family history of multiple endocrine neoplasia type 2, medullary thyroid cancer; personal history of pancreatitis or gallbladder disease., Discussed side effects of gastrointestinal upset/nausea; eating smaller meals, avoiding high-fat foods, and  remaining upright after eating may reduce nausea. Discussed that overeating is a major trigger of nausea with this class of medications, as often times patients will start to feel full sooner and may need to decrease portion sizes from what they were previously accustomed to.   Follow Up Plan: 3/4  Darrelyn Drum, PharmD, BCPS, CPP Clinical Pharmacist Practitioner [place] [facility] [phone]    "

## 2024-12-29 ENCOUNTER — Other Ambulatory Visit
